# Patient Record
Sex: Male | Born: 1975
Health system: Southern US, Community
[De-identification: ages and names within clinical notes are randomized; demographics above are authoritative.]

## PROBLEM LIST (undated history)

## (undated) DIAGNOSIS — A020 Salmonella enteritis: Secondary | ICD-10-CM

## (undated) DIAGNOSIS — E119 Type 2 diabetes mellitus without complications: Secondary | ICD-10-CM

## (undated) DIAGNOSIS — M722 Plantar fascial fibromatosis: Secondary | ICD-10-CM

## (undated) DIAGNOSIS — G43909 Migraine, unspecified, not intractable, without status migrainosus: Secondary | ICD-10-CM

## (undated) DIAGNOSIS — R252 Cramp and spasm: Secondary | ICD-10-CM

## (undated) DIAGNOSIS — Z87898 Personal history of other specified conditions: Secondary | ICD-10-CM

## (undated) DIAGNOSIS — E785 Hyperlipidemia, unspecified: Secondary | ICD-10-CM

## (undated) DIAGNOSIS — F419 Anxiety disorder, unspecified: Secondary | ICD-10-CM

## (undated) DIAGNOSIS — E042 Nontoxic multinodular goiter: Secondary | ICD-10-CM

## (undated) HISTORY — DX: Salmonella enteritis: A02.0

## (undated) HISTORY — DX: Personal history of other specified conditions: Z87.898

## (undated) HISTORY — DX: Migraine, unspecified, not intractable, without status migrainosus: G43.909

## (undated) HISTORY — DX: Type 2 diabetes mellitus without complications: E11.9

## (undated) HISTORY — DX: Hyperlipidemia, unspecified: E78.5

## (undated) HISTORY — PX: TONSILLECTOMY: SUR1361

## (undated) HISTORY — DX: Cramp and spasm: R25.2

## (undated) HISTORY — PX: CHOLECYSTECTOMY: SHX55

## (undated) HISTORY — DX: Anxiety disorder, unspecified: F41.9

## (undated) HISTORY — DX: Nontoxic multinodular goiter: E04.2

## (undated) HISTORY — DX: Plantar fascial fibromatosis: M72.2

---

## 2004-04-09 ENCOUNTER — Emergency Department: Payer: Self-pay | Admitting: Emergency Medicine

## 2005-07-29 ENCOUNTER — Ambulatory Visit: Payer: Self-pay | Admitting: Gastroenterology

## 2005-10-06 ENCOUNTER — Emergency Department: Payer: Self-pay | Admitting: Unknown Physician Specialty

## 2006-03-07 ENCOUNTER — Emergency Department: Payer: Self-pay | Admitting: Emergency Medicine

## 2006-04-10 ENCOUNTER — Encounter: Payer: Self-pay | Admitting: Family Medicine

## 2006-06-06 ENCOUNTER — Emergency Department: Payer: Self-pay

## 2006-07-06 ENCOUNTER — Ambulatory Visit: Payer: Self-pay | Admitting: Surgery

## 2008-06-26 ENCOUNTER — Encounter: Payer: Self-pay | Admitting: Family Medicine

## 2009-06-09 DIAGNOSIS — A020 Salmonella enteritis: Secondary | ICD-10-CM

## 2009-06-09 HISTORY — DX: Salmonella enteritis: A02.0

## 2009-12-13 ENCOUNTER — Emergency Department: Payer: Self-pay | Admitting: Emergency Medicine

## 2010-03-28 ENCOUNTER — Ambulatory Visit: Payer: Self-pay | Admitting: Family Medicine

## 2010-03-28 DIAGNOSIS — E042 Nontoxic multinodular goiter: Secondary | ICD-10-CM

## 2010-03-28 DIAGNOSIS — E785 Hyperlipidemia, unspecified: Secondary | ICD-10-CM

## 2010-03-29 ENCOUNTER — Ambulatory Visit: Payer: Self-pay | Admitting: Family Medicine

## 2010-03-29 ENCOUNTER — Encounter: Payer: Self-pay | Admitting: Family Medicine

## 2010-03-29 LAB — CONVERTED CEMR LAB
AST: 21 units/L (ref 0–37)
Alkaline Phosphatase: 66 units/L (ref 39–117)
BUN: 14 mg/dL (ref 6–23)
Bilirubin, Direct: 0.1 mg/dL (ref 0.0–0.3)
CO2: 26 meq/L (ref 19–32)
Chloride: 103 meq/L (ref 96–112)
Cholesterol: 190 mg/dL (ref 0–200)
GFR calc non Af Amer: 97.15 mL/min (ref >60)
HDL: 32.5 mg/dL — ABNORMAL LOW (ref >39.00)
Total Bilirubin: 0.6 mg/dL (ref 0.3–1.2)
Triglycerides: 80 mg/dL (ref 0.0–149.0)
VLDL: 16 mg/dL (ref 0.0–40.0)

## 2010-04-04 ENCOUNTER — Encounter (INDEPENDENT_AMBULATORY_CARE_PROVIDER_SITE_OTHER): Payer: Self-pay | Admitting: *Deleted

## 2010-04-04 ENCOUNTER — Telehealth: Payer: Self-pay | Admitting: Family Medicine

## 2010-07-09 NOTE — Assessment & Plan Note (Signed)
Summary: NEW PT TO BE ESTABLISHED/JRR   Vital Signs:  Patient profile:   35 year old male Height:      68.75 inches Weight:      249.12 pounds BMI:     37.19 Temp:     98.9 degrees F oral Pulse rate:   80 / minute Pulse rhythm:   regular BP sitting:   122 / 90  (left arm) Cuff size:   large  Vitals Entered By: Delilah Shan CMA Paul Torpey Dull) (March 28, 2010 9:53 AM) CC: New Patient to Establish   History of Present Illness: New patient.    H/o thyroid nodule.  Due for ultrasound and labs.  Some fatigue noted.  See notes on labs.  Requesting records.   H/o HLD.  Off meds.  Due for labs.  Requesting records.    Allergies (verified): 1)  ! 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, fashion/cl[employer] Divorced x2, 1 daughter, little contact,  no tobacco alcohol: occ minimal exercise  Past Medical History: H/o thyroid nodule Migraines with photo and phonophobia h/o heartburn HLD h/o leg cramps Salmonella enteritis 2011, stool cx postiive  Past Surgical History: Cholecystectomy Tonsillectomy  Family History: Mother alive, HTN, Bipolar, diet controlled DM, COPD Father alive, DM, thyroid disease, PAD 1 brother, thyroid disease, HTN 1 sister, healthy  Social History: From [place], in [place] since 1996 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  Otherwise negative.    Physical Exam  General:  GEN: nad, alert and oriented HEENT: mucous membranes moist NECK: supple w/o LA, no TMG noted CV: rrr.  no murmur PULM: ctab, no inc wob ABD: soft, +bs EXT: no edema SKIN: no acute rash    Impression &  Recommendations:  Problem # 1:  HYPERLIPIDEMIA (ICD-272.4)  Requesting records from Dr. Patrecia Pace.  See notes on labs.   Orders: TLB-Hepatic/Liver Function Pnl (80076-HEPATIC) TLB-BMP (Basic Metabolic Panel-BMET) (80048-METABOL) TLB-Lipid Panel (80061-LIPID)  Problem # 2:  THYROID NODULE, HX OF (ICD-V12.2) Refer for ultrasound and check TSH.   Orders: Radiology Referral (Radiology) TLB-TSH (Thyroid Stimulating Hormone) [phone])  Other Orders: Admin 1st Vaccine (19147) Flu Vaccine 66yrs + (82956) Tdap => 63yrs IM (21308) Admin of Any Addtl Vaccine (65784)  Patient Instructions: 1)  See Shirlee Limerick about your referral before your leave today. 2)  You can get your results through our phone system.  Follow the instructions on the blue card.  3)  Take care.  Glad to see you.  Work on getting more exercise.    Orders Added: 1)  New Patient Level III [99203] 2)  Radiology Referral [Radiology] 3)  TLB-Hepatic/Liver Function Pnl [80076-HEPATIC] 4)  TLB-BMP (Basic Metabolic Panel-BMET) [80048-METABOL] 5)  TLB-Lipid Panel [80061-LIPID] 6)  TLB-TSH (Thyroid Stimulating Hormone) [84443-TSH] 7)  Admin 1st Vaccine [90471] 8)  Flu Vaccine 23yrs + [69629] 9)  Tdap => 92yrs IM [90715] 10)  Admin of Any Addtl Vaccine [52841]   Immunizations Administered:  Tetanus Vaccine:    Vaccine Type: Tdap    Site: left deltoid    Mfr: GlaxoSmithKline    Dose: 0.5 ml    Route: IM    Given  by: Delilah Shan CMA (AAMA)    Exp. Date: 03/28/2012    Lot #: [id number]    VIS given: 04/26/08 version given March 28, 2010.   Immunizations Administered:  Tetanus Vaccine:    Vaccine Type: Tdap    Site: left deltoid    Mfr: GlaxoSmithKline    Dose: 0.5 ml    Route: IM    Given by: Delilah Shan CMA (AAMA)    Exp. Date: 03/28/2012    Lot #: [id number]    VIS given: 04/26/08 version given March 28, 2010.  Prior Medications (reviewed today): None Current Allergies (reviewed today): !  PENICILLINFlu Vaccine Consent Questions     Do you have a history of severe allergic reactions to this vaccine? no    Any prior history of allergic reactions to egg and/or gelatin? no    Do you have a sensitivity to the preservative Thimersol? no    Do you have a past history of Guillan-Barre Syndrome? no    Do you currently have an acute febrile illness? no    Have you ever had a severe reaction to latex? no    Vaccine information given and explained to patient? yes    Are you currently pregnant? no    Lot Number:[id number]   Exp Date:12/07/2010   Site Given  Right  Deltoid IMes (reviewed today): ! PENICILLIN         .lbflu .

## 2010-07-09 NOTE — Letter (Signed)
Summary: Dr. Sharin Grave  Dr. Sharin Grave   Imported By: Maryln Gottron 04/10/2010 11:19:05  _____________________________________________________________________  External Attachment:    Type:   Image     Comment:   External Document

## 2010-07-09 NOTE — Letter (Signed)
Summary: Generic Letter   at [facility]  [address] [place], [place] [address]   Phone: [phone]  Fax: [phone]    04/04/2010    Kyle Murphy [address] [place], [place]  [address]    Dear [name],   I have reviewed your old records.  We need to get a repeat ultrasound of your thyroid in one year and lab work.        Sincerely,   Dwana Curd. Para March, M.D.  [facility]

## 2010-07-09 NOTE — Progress Notes (Signed)
  Phone Note Outgoing Call   Summary of Call: I reviwed the records.  Needs repeat US and TSH in 1 year.  please notify patient.  Initial call taken by: Crawford Givens MD,  April 04, 2010 2:04 PM  Follow-up for Phone Call        Phone number is no longer in service.  Letter mailed. Delilah Shan CMA Duncan Dull)  April 04, 2010 4:42 PM

## 2011-04-01 ENCOUNTER — Encounter: Payer: Self-pay | Admitting: Family Medicine

## 2011-04-01 ENCOUNTER — Ambulatory Visit (INDEPENDENT_AMBULATORY_CARE_PROVIDER_SITE_OTHER): Payer: BC Managed Care – PPO | Admitting: Family Medicine

## 2011-04-01 VITALS — BP 114/80 | HR 79 | Temp 98.2°F | Wt 251.1 lb

## 2011-04-01 DIAGNOSIS — Z0000 Encounter for general adult medical examination without abnormal findings: Secondary | ICD-10-CM | POA: Insufficient documentation

## 2011-04-01 DIAGNOSIS — Z862 Personal history of diseases of the blood and blood-forming organs and certain disorders involving the immune mechanism: Secondary | ICD-10-CM

## 2011-04-01 DIAGNOSIS — E042 Nontoxic multinodular goiter: Secondary | ICD-10-CM

## 2011-04-01 DIAGNOSIS — Z8639 Personal history of other endocrine, nutritional and metabolic disease: Secondary | ICD-10-CM

## 2011-04-01 DIAGNOSIS — Z23 Encounter for immunization: Secondary | ICD-10-CM

## 2011-04-01 DIAGNOSIS — M549 Dorsalgia, unspecified: Secondary | ICD-10-CM | POA: Insufficient documentation

## 2011-04-01 LAB — TSH: TSH: 1.54 u[IU]/mL (ref 0.35–5.50)

## 2011-04-01 NOTE — Assessment & Plan Note (Signed)
No red flag sx of weakness, dec in DTR, etc.  He'll f/u with chiropractor clinic.

## 2011-04-01 NOTE — Assessment & Plan Note (Signed)
Refer for repeat u/s and check TSH today.  He'll ask his brother about his thyroid disease and notify me.  Benign exam.

## 2011-04-01 NOTE — Progress Notes (Signed)
CPE- See plan.  Routine anticipatory guidance given to patient.  See health maintenance.  No indication for early prostate/colon CA screening.    Dec in exercise from leg pain.  "It's just sore" but he can't really elaborate.  Denies burning pain or electrical shock pain. With prolonged sitting, R leg can feel like it's asleep.  He hasn't found anything that helps other than occ ibuprofen. He had prev seen chiropractor with some relief.  The pain seems to be nondermatomal. He does have some R lower back discomfort.   H/o thyroid nodules, due for repeat u/s.  No known FH of thyroid cancer.  Brother had thyroid disease but he doesn't know what kind.  No ant neck pain, no dysphagia.  He's not felt a lump in neck.   PMH and SH reviewed  Meds, vitals, and allergies reviewed.   ROS: See HPI.  Otherwise negative.    GEN: nad, alert and oriented HEENT: mucous membranes moist NECK: supple w/o LA, thyroid not ttp CV: rrr. PULM: ctab, no inc wob ABD: soft, +bs EXT: no edema SKIN: no acute rash Back with R sided L spine discomfort, no midline pain SLR w/o typical radicular pain.  S/S/DTR wnl distally for BLE.

## 2011-04-01 NOTE — Assessment & Plan Note (Signed)
Flu shot today, healthy habits encouraged.  No indication for prostate/colon CA screening.  Tdap up to date.

## 2011-04-01 NOTE — Patient Instructions (Addendum)
I would follow up with the chiropractor about your back and leg pain. I would get a flu shot each fall.   Talk to your brother about why he had thyroid surgery and let me know.  Take care.   You can get your results through our phone system.  Follow the instructions on the blue card.

## 2011-04-04 ENCOUNTER — Encounter: Payer: Self-pay | Admitting: Family Medicine

## 2011-04-04 ENCOUNTER — Ambulatory Visit
Admission: RE | Admit: 2011-04-04 | Discharge: 2011-04-04 | Disposition: A | Discharge status: Home / Self Care | Payer: BC Managed Care – PPO | Source: Ambulatory Visit | Attending: Family Medicine | Admitting: Family Medicine

## 2011-04-04 DIAGNOSIS — E042 Nontoxic multinodular goiter: Secondary | ICD-10-CM

## 2011-08-21 ENCOUNTER — Encounter: Payer: Self-pay | Admitting: *Deleted

## 2012-02-23 ENCOUNTER — Emergency Department: Payer: Self-pay | Admitting: Emergency Medicine

## 2012-04-01 ENCOUNTER — Ambulatory Visit (INDEPENDENT_AMBULATORY_CARE_PROVIDER_SITE_OTHER): Payer: BC Managed Care – PPO | Admitting: Family Medicine

## 2012-04-01 ENCOUNTER — Encounter: Payer: Self-pay | Admitting: Family Medicine

## 2012-04-01 VITALS — BP 114/74 | HR 87 | Temp 98.4°F | Ht 69.5 in | Wt 244.0 lb

## 2012-04-01 DIAGNOSIS — Z8639 Personal history of other endocrine, nutritional and metabolic disease: Secondary | ICD-10-CM

## 2012-04-01 DIAGNOSIS — Z23 Encounter for immunization: Secondary | ICD-10-CM

## 2012-04-01 DIAGNOSIS — E041 Nontoxic single thyroid nodule: Secondary | ICD-10-CM

## 2012-04-01 DIAGNOSIS — Z0000 Encounter for general adult medical examination without abnormal findings: Secondary | ICD-10-CM

## 2012-04-01 NOTE — Patient Instructions (Addendum)
Go to the lab on the way out.  We'll contact you with your lab report. See Marion about your referral before you leave today. Take care.   

## 2012-04-01 NOTE — Assessment & Plan Note (Signed)
Routine anticipatory guidance given to patient. See health maintenance.  Exercise- at the gym.  Flu shot- 2013  Tetanus 2011  Cholesterol checked 2011  Living will encouraged.  Healthy diet encouraged.

## 2012-04-01 NOTE — Assessment & Plan Note (Signed)
Repeat TSH, check u/s.  D/w pt. He agrees.

## 2012-04-01 NOTE — Progress Notes (Signed)
CPE- See plan.  Routine anticipatory guidance given to patient.  See health maintenance. Exercise- at the gym.  Flu shot- 2013 Tetanus 2011 Cholesterol checked 2011 Living will encouraged.   Healthy diet encouraged.   Thyroid nodules.  No masses, dysphagia, pain.  No unexpected weight gain or fatigue.  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    PMH and SH reviewed  Meds, vitals, and allergies reviewed.   ROS: See HPI.  Otherwise negative.    GEN: nad, alert and oriented HEENT: mucous membranes moist NECK: supple w/o LA, thyroid not ttp, no asymmetry noted/felt on exam CV: rrr. PULM: ctab, no inc wob ABD: soft, +bs EXT: no edema SKIN: no acute rash

## 2012-04-06 ENCOUNTER — Other Ambulatory Visit: Payer: BC Managed Care – PPO

## 2012-04-06 ENCOUNTER — Ambulatory Visit
Admission: RE | Admit: 2012-04-06 | Discharge: 2012-04-06 | Disposition: A | Discharge status: Home / Self Care | Payer: BC Managed Care – PPO | Source: Ambulatory Visit | Attending: Family Medicine | Admitting: Family Medicine

## 2012-04-06 ENCOUNTER — Encounter: Payer: Self-pay | Admitting: Family Medicine

## 2012-04-06 DIAGNOSIS — E041 Nontoxic single thyroid nodule: Secondary | ICD-10-CM

## 2012-08-10 ENCOUNTER — Encounter: Payer: Self-pay | Admitting: Family Medicine

## 2012-08-10 LAB — GLUCOSE (CC13)
Cholesterol: 182 mg/dL (ref 0–200)
Creat: 0.91
Glucose: 105
HDL: 28 mg/dL — AB (ref 35–70)
LDL (calc): 120
Triglycerides: 168

## 2013-06-10 ENCOUNTER — Encounter: Payer: Self-pay | Admitting: Podiatrist

## 2013-09-23 ENCOUNTER — Encounter: Payer: Self-pay | Admitting: Family Medicine

## 2013-09-23 ENCOUNTER — Ambulatory Visit (INDEPENDENT_AMBULATORY_CARE_PROVIDER_SITE_OTHER): Payer: 59 | Admitting: Family Medicine

## 2013-09-23 VITALS — BP 112/74 | HR 63 | Temp 98.1°F | Wt 251.5 lb

## 2013-09-23 DIAGNOSIS — R358 Other polyuria: Secondary | ICD-10-CM

## 2013-09-23 DIAGNOSIS — M722 Plantar fascial fibromatosis: Secondary | ICD-10-CM | POA: Insufficient documentation

## 2013-09-23 DIAGNOSIS — R3589 Other polyuria: Secondary | ICD-10-CM | POA: Insufficient documentation

## 2013-09-23 LAB — POCT URINALYSIS DIPSTICK
Bilirubin, UA: NEGATIVE
Glucose, UA: NEGATIVE
Ketones, UA: NEGATIVE
Leukocytes, UA: NEGATIVE
Nitrite, UA: NEGATIVE
PH UA: 7
RBC UA: NEGATIVE
SPEC GRAV UA: 1.02
UROBILINOGEN UA: NEGATIVE

## 2013-09-23 LAB — HEMOGLOBIN A1C: HEMOGLOBIN A1C: 5.6 % (ref 4.6–6.5)

## 2013-09-23 LAB — POCT CBG (FASTING - GLUCOSE)-MANUAL ENTRY: Glucose Fasting, POC: 83 mg/dL (ref 70–99)

## 2013-09-23 NOTE — Assessment & Plan Note (Signed)
Sugar wnl here, check A1c and u/a.  He agrees.  Continue with inc PO fluids in the meantime.  FH DM2 noted.  D/w pt.  He agrees with plan.

## 2013-09-23 NOTE — Patient Instructions (Signed)
Go to the lab on the way out.  We'll contact you with your lab report. Take care.   

## 2013-09-23 NOTE — Progress Notes (Signed)
Pre visit review using our clinic review tool, if applicable. No additional management support is needed unless otherwise documented below in the visit note.  Frequent urination.  Started a few months ago, progressive.  No burning.  Thirsty.  No blood seen in urine.  No dysuria other than frequency.  FH DM2.  Feels okay o/w.    Meds, vitals, and allergies reviewed.   ROS: See HPI.  Otherwise, noncontributory.  nad ncat Mmm rrr ctab abd soft, not ttp Ext w/o edema

## 2013-09-23 NOTE — Assessment & Plan Note (Signed)
D/w pt about stretching and getting soft arch support inserts.  That may help some.

## 2013-11-28 ENCOUNTER — Ambulatory Visit (INDEPENDENT_AMBULATORY_CARE_PROVIDER_SITE_OTHER): Payer: 59 | Admitting: Family Medicine

## 2013-11-28 ENCOUNTER — Encounter: Payer: Self-pay | Admitting: Family Medicine

## 2013-11-28 VITALS — BP 126/78 | HR 70 | Temp 98.5°F | Wt 249.0 lb

## 2013-11-28 DIAGNOSIS — R05 Cough: Secondary | ICD-10-CM | POA: Insufficient documentation

## 2013-11-28 DIAGNOSIS — R059 Cough, unspecified: Secondary | ICD-10-CM | POA: Insufficient documentation

## 2013-11-28 MED ORDER — ESOMEPRAZOLE MAGNESIUM 20 MG PO CPDR: 20.0000 mg | DELAYED_RELEASE_CAPSULE | Freq: Every day | ORAL | Status: DC

## 2013-11-28 MED ORDER — FLUTICASONE PROPIONATE 50 MCG/ACT NA SUSP: 2.0000 | Freq: Every day | NASAL | Status: DC

## 2013-11-28 MED ORDER — GUAIFENESIN-CODEINE 100-10 MG/5ML PO SYRP: 5.0000 mL | ORAL_SOLUTION | Freq: Two times a day (BID) | ORAL | Status: DC | PRN

## 2013-11-28 NOTE — Progress Notes (Signed)
Pre visit review using our clinic review tool, if applicable. No additional management support is needed unless otherwise documented below in the visit note. 

## 2013-11-28 NOTE — Assessment & Plan Note (Signed)
Of 1 mo duration, however lungs CTAB today. ?hidden GERD vs PNDrip. Treat both with trial of nexium 20mg  daily and flonase (sent to pharmacy) Codeine cough syrup for cough at bedtime as needed - discussed sedation precautions. Update if cough not improved with above. Exam/story not consistent with whooping cough today.

## 2013-11-28 NOTE — Patient Instructions (Addendum)
I don't think there's bronchitis infection. I wonder about hidden reflux or allergies. Start nexium samples, use flonase nasal spray for the next 2 weeks. May also use codeine cough syrup at bedtime as needed. Let us know if cough not improving with this. Good to see you today, call us with questions.

## 2013-11-28 NOTE — Progress Notes (Signed)
   BP 126/78  Pulse 70  Temp(Src) 98.5 F (36.9 C) (Oral)  Wt 249 lb (112.946 kg)  SpO2 99%   CC: cough  Subjective:    Patient ID: Kyle Murphy, male    DOB: 12-Jul-1975, 38 y.o.   MRN: [id number]  HPI: Kyle Murphy is a 38 y.o. male presenting on 11/28/2013 for URI   1 mo h/o dry cough. Now with L sided ribcage pain. Started with sore throat and irritation. Tickle that makes him cough.  + PNdrainage. No coughing fits.  No fevers/chills, abd pain, nausea, significant congestion, ear or tooth pain, dysphagia.  Works 3rd shift. Has self treated with 2 bottles robitussin DM. Took tylenol PM which helped. No sick contacts at home. No smokers at home. No h/o asthma, allergies.  H/o GERD in past but no current sxs.  Relevant past medical, surgical, family and social history reviewed and updated as indicated.  Allergies and medications reviewed and updated. No current outpatient prescriptions on file prior to visit.   No current facility-administered medications on file prior to visit.    Review of Systems Per HPI unless specifically indicated above    Objective:    BP 126/78  Pulse 70  Temp(Src) 98.5 F (36.9 C) (Oral)  Wt 249 lb (112.946 kg)  SpO2 99%  Physical Exam  Nursing note and vitals reviewed. Constitutional: He appears well-developed and well-nourished. No distress.  HENT:  Head: Normocephalic and atraumatic.  Right Ear: Hearing, tympanic membrane, external ear and ear canal normal.  Left Ear: Hearing, tympanic membrane, external ear and ear canal normal.  Nose: Mucosal edema (mild) present. No rhinorrhea. Right sinus exhibits no maxillary sinus tenderness and no frontal sinus tenderness. Left sinus exhibits no maxillary sinus tenderness and no frontal sinus tenderness.  Mouth/Throat: Uvula is midline, oropharynx is clear and moist and mucous membranes are normal. No oropharyngeal exudate, posterior oropharyngeal edema, posterior oropharyngeal erythema or  tonsillar abscesses.  Slight congestion behind TMs  Eyes: Conjunctivae and EOM are normal. Pupils are equal, round, and reactive to light. No scleral icterus.  Neck: Normal range of motion. Neck supple. No thyromegaly present.  Cardiovascular: Normal rate, regular rhythm, normal heart sounds and intact distal pulses.   No murmur heard. Pulmonary/Chest: Effort normal and breath sounds normal. No respiratory distress. He has no wheezes. He has no rales.  Lymphadenopathy:    He has no cervical adenopathy.  Skin: Skin is warm and dry. No rash noted.       Assessment & Plan:   Problem List Items Addressed This Visit   Cough - Primary     Of 1 mo duration, however lungs CTAB today. ?hidden GERD vs PNDrip. Treat both with trial of nexium 20mg  daily and flonase (sent to pharmacy) Codeine cough syrup for cough at bedtime as needed - discussed sedation precautions. Update if cough not improved with above. Exam/story not consistent with whooping cough today.        Follow up plan: Return if symptoms worsen or fail to improve.

## 2014-03-24 ENCOUNTER — Ambulatory Visit (INDEPENDENT_AMBULATORY_CARE_PROVIDER_SITE_OTHER): Payer: 59

## 2014-03-24 DIAGNOSIS — Z23 Encounter for immunization: Secondary | ICD-10-CM

## 2014-08-08 ENCOUNTER — Ambulatory Visit (INDEPENDENT_AMBULATORY_CARE_PROVIDER_SITE_OTHER): Payer: 59 | Admitting: Family Medicine

## 2014-08-08 ENCOUNTER — Encounter: Payer: Self-pay | Admitting: Family Medicine

## 2014-08-08 VITALS — BP 122/84 | HR 78 | Temp 98.3°F | Ht 69.25 in | Wt 273.0 lb

## 2014-08-08 DIAGNOSIS — Z0000 Encounter for general adult medical examination without abnormal findings: Secondary | ICD-10-CM

## 2014-08-08 DIAGNOSIS — E785 Hyperlipidemia, unspecified: Secondary | ICD-10-CM

## 2014-08-08 DIAGNOSIS — Z7189 Other specified counseling: Secondary | ICD-10-CM

## 2014-08-08 DIAGNOSIS — K219 Gastro-esophageal reflux disease without esophagitis: Secondary | ICD-10-CM

## 2014-08-08 DIAGNOSIS — E042 Nontoxic multinodular goiter: Secondary | ICD-10-CM

## 2014-08-08 DIAGNOSIS — E041 Nontoxic single thyroid nodule: Secondary | ICD-10-CM

## 2014-08-08 LAB — LIPID PANEL
CHOL/HDL RATIO: 6
Cholesterol: 172 mg/dL (ref 0–200)
HDL: 29.7 mg/dL — AB (ref >39.00)
LDL CALC: 128 mg/dL — AB (ref 0–99)
NONHDL: 142.3
TRIGLYCERIDES: 71 mg/dL (ref 0.0–149.0)
VLDL: 14.2 mg/dL (ref 0.0–40.0)

## 2014-08-08 LAB — TSH: TSH: 3.81 u[IU]/mL (ref 0.35–4.50)

## 2014-08-08 LAB — GLUCOSE, RANDOM: GLUCOSE: 105 mg/dL — AB (ref 70–99)

## 2014-08-08 MED ORDER — OMEPRAZOLE MAGNESIUM 20 MG PO TBEC: 20.0000 mg | DELAYED_RELEASE_TABLET | Freq: Every day | ORAL | Status: DC

## 2014-08-08 NOTE — Patient Instructions (Addendum)
Go to the lab on the way out.  We'll contact you with your lab report. Rosaria Ferries will call about your referral.  I'll work on your form when I have your lab results.  Take OTC prilosec for heartburn in the meantime.  You'll have to make diet changes: less fast food, more exercise.  Let me know if you want to go to nutrition.   Take care.  Glad to see you.

## 2014-08-08 NOTE — Progress Notes (Signed)
Pre visit review using our clinic review tool, if applicable. No additional management support is needed unless otherwise documented below in the visit note.  CPE- See plan.  Routine anticipatory guidance given to patient.  See health maintenance. Exercise- at the gym prev, but not recently.  Encouraged.   Flu shot- 2015 Tetanus 2011 Colon and prostate cancer screening not due.  Cholesterol checked today, along with sugar.   Living will encouraged. He would want his mother designated if patient were incapacitated.   Healthy diet encouraged.   Thyroid nodules. Some dysphagia, coughing with swallowing. Some discomfort in the throat. Weight is up, likely from diet changes. 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   GERD.  Off nexium.  Frequent sx.  Worse with weight inc.  No vomiting, no diarrhea.  Worse with certain foods.  Eating a lot of fast foods.    PMH and SH reviewed  Meds, vitals, and allergies reviewed.   ROS: See HPI.  Otherwise negative.    GEN: nad, alert and oriented, obese HEENT: mucous membranes moist NECK: supple w/o LA, thyroid not ttp on exam.  CV: rrr. PULM: ctab, no inc wob ABD: soft, +bs EXT: no edema SKIN: no acute rash

## 2014-08-09 ENCOUNTER — Ambulatory Visit
Admission: RE | Admit: 2014-08-09 | Discharge: 2014-08-09 | Disposition: A | Discharge status: Home / Self Care | Payer: 59 | Source: Ambulatory Visit | Attending: Family Medicine | Admitting: Family Medicine

## 2014-08-09 DIAGNOSIS — Z7189 Other specified counseling: Secondary | ICD-10-CM | POA: Insufficient documentation

## 2014-08-09 DIAGNOSIS — K219 Gastro-esophageal reflux disease without esophagitis: Secondary | ICD-10-CM | POA: Insufficient documentation

## 2014-08-09 DIAGNOSIS — E042 Nontoxic multinodular goiter: Secondary | ICD-10-CM

## 2014-08-09 NOTE — Assessment & Plan Note (Signed)
Needs weight loss, d/w pt.  Can start OTC prilosec for now.  He agrees.

## 2014-08-09 NOTE — Assessment & Plan Note (Signed)
Routine anticipatory guidance given to patient.  See health maintenance. Exercise- at the gym prev, but not recently.  Encouraged.   Flu shot- 2015 Tetanus 2011 Colon and prostate cancer screening not due.  Cholesterol checked today, along with sugar.   Living will encouraged. He would want his mother designated if patient were incapacitated.   Healthy diet encouraged.

## 2014-08-09 NOTE — Assessment & Plan Note (Signed)
Recheck u/s.  TSH wnl.  D/w pt . He agrees.

## 2014-08-13 ENCOUNTER — Other Ambulatory Visit: Payer: Self-pay | Admitting: Family Medicine

## 2014-08-13 DIAGNOSIS — E041 Nontoxic single thyroid nodule: Secondary | ICD-10-CM

## 2014-08-14 ENCOUNTER — Telehealth: Payer: Self-pay | Admitting: *Deleted

## 2014-08-14 NOTE — Telephone Encounter (Signed)
Patient was advised that our referral coordinators will be calling him to set up an appointment.

## 2014-09-28 ENCOUNTER — Ambulatory Visit (INDEPENDENT_AMBULATORY_CARE_PROVIDER_SITE_OTHER): Payer: 59 | Admitting: Endocrinology

## 2014-09-28 ENCOUNTER — Encounter: Payer: Self-pay | Admitting: Endocrinology

## 2014-09-28 VITALS — BP 122/82 | HR 78 | Resp 14 | Ht 69.25 in | Wt 275.0 lb

## 2014-09-28 DIAGNOSIS — E042 Nontoxic multinodular goiter: Secondary | ICD-10-CM

## 2014-09-28 DIAGNOSIS — E041 Nontoxic single thyroid nodule: Secondary | ICD-10-CM | POA: Diagnosis not present

## 2014-09-28 LAB — TSH: TSH: 1.37 u[IU]/mL (ref 0.35–4.50)

## 2014-09-28 LAB — T4, FREE: FREE T4: 1.01 ng/dL (ref 0.60–1.60)

## 2014-09-28 NOTE — Progress Notes (Signed)
Reason for  Visit-  Kyle Murphy is a 39 y.o.-year-old male, referred by his PCP, Tonia Ghent, MD,  for evaluation for Multinodular goiter.  HPI-  The patient reports being diagnosed with a goiter and thyroid nodules since 2012.  his thyroid ultrasound was done March 2016 at [facility] radiology . It showed enlarging isthmic nodule, now measuring 10 x 5 x 9 mm (Previously this measured up to 8 mm) and enlarging left lobe complex nodule (measures 11 x 9 x 8 mm. Previously, this measured 7 x 8 x 10 mm) .  Prior Thyroid US results are summarized below.  No prior thyroid FNA. PCP has been monitoring thyroid nodules with serial thyroid US and due to enlarging nodules has sent patient to endocrine for further opinion.  he has a family history of thyroid disorders in his father,twin brother and mother. Brother had thyroid surgery, details unclear- but no thyroid cancer. The patient denies any family history of thyroid cancer or personal history of XRT to his neck area. Non smoker.    The patient denies any prior personal history of hypothyroidism. He denies any kelp/herbal supplement use or recent steroids. No recent iv contrast studies.  I reviewed patient's thyroid tests: Lab Results  Component Value Date   TSH 1.37 09/28/2014   TSH 3.81 08/08/2014   TSH 0.90 04/01/2012   TSH 1.54 04/01/2011   TSH 1.57 03/28/2010   FREET4 1.01 09/28/2014       Review of systems: [ x  ] complains of    [  ] denies [  x] weight gain of close to 40 lbs with little change in his diet in past 7 months  [  ] constipation  [  ] fatigue  [  ] dry skin  [  ] cold intolerance [  ] hair changes  [  ] weight loss [  ] tremors [  ] palpitations [  ] diarrhea [  ] increased anxiety [  ] muscle weakness [  ] heat intolerance [  ] fatigue  [  ] proptosis [  ] problems with eye closure or color vision.  [  ] noticing any enlargement in size of thyroid [  ] lumps in neck [ x ] dysphagia -occasionally gets choked with  certain foods, <1 year [  ] change in voice  Reports that he may spontaneously go into gagging spells with or without palpation of neck , usually happens early morning and subsides after 10 am.  Has GERD and now taking Nexium OTC. Partial compliance to this medication.      I have reviewed the patient's past medical history, family and social history, surgical history, medications and allergies.  Past Medical History  Diagnosis Date  . Migraines     with photo and phonophobia  . History of heartburn   . HLD (hyperlipidemia)   . Leg cramps     history of  . Salmonella enteritis 2011    stool culture positive  . Multiple thyroid nodules     hx of, ~1 cm or less, consider recheck u/s 10/13  . Plantar fasciitis     L foot, injected at foot center 2014   Past Surgical History  Procedure Laterality Date  . Cholecystectomy    . Tonsillectomy     Family History  Problem Relation Age of Onset  . Hypertension Mother   . Depression Mother     bipolar  . Diabetes Mother     diet  controlled  . COPD Mother   . Thyroid disease Mother   . Diabetes Father   . Thyroid disease Father   . Thyroid disease Brother   . Hypertension Brother   . Diabetes Brother   . Colon cancer Neg Hx   . Prostate cancer Neg Hx    History   Social History  . Marital Status: Divorced    Spouse Name: N/A  . Number of Children: 1  . Years of Education: N/A   Occupational History  . 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 Smoking status: Never Smoker   . Smokeless tobacco: Never Used  . Alcohol Use: No  . Drug Use: No  . Sexual Activity: Not on file   Other Topics Concern  . Not on file   Social History Narrative   From [place], in [place] since 1996   Divorced x 2, 1 daughter with little contact.   Working 3rd shift at [employer] 2016   Living with brother as of 2016   Current Outpatient Prescriptions on File Prior to Visit  Medication Sig Dispense Refill  .  omeprazole (PRILOSEC OTC) 20 MG tablet Take 1 tablet (20 mg total) by mouth daily. 30 tablet 5   No current facility-administered medications on file prior to visit.   Allergies  Allergen Reactions  . Penicillins     REACTION: Rash, swelling as a child.     Review of Systems- Review of Systems: [x]  complains of  [  ] denies General:   [ x ] Recent weight change [  ] Fatigue  [  ] Loss of appetite Eyes: [  ]  Vision Difficulty [  ]  Eye pain ENT: [  ]  Hearing difficulty [ x ]  Difficulty Swallowing CVS: [  ] Chest pain [  ]  Palpitations/Irregular Heart beat [  ]  Shortness of breath lying flat [  ] Swelling of legs Resp: [ x ] Frequent Cough [  ] Shortness of Breath  [  ]  Wheezing GI: [ x ] Heartburn  [  ] Nausea or Vomiting  [  ] Diarrhea [  ] Constipation  [  ] Abdominal Pain GU: [  ]  Polyuria  [  ]  nocturia Bones/joints:  [  ]  Muscle aches  [  ] Joint Pain  [  ] Bone pain Skin/Hair/Nails: [  ]  Rash  [  ] New stretch marks [  ]  Itching [  ] Hair loss [  ]  Excessive hair growth Reproduction: [  ] Low sexual desire , [  ]  Women: Menstrual cycle problems [  ]  Women: Breast Discharge [  ] Men: Difficulty with erections [  ]  Men: Enlarged Breasts CNS: [  ] Frequent Headaches [  ] Blurry vision [  ] Tremors [  ] Seizures [  ] Loss of consciousness [  ] Localized weakness Endocrine: [  ]  Excess thirst [  ]  Feeling excessively hot [  ]  Feeling excessively cold Heme: [  ]  Easy bruising [  ]  Enlarged glands or lumps in neck Allergy: [  ]  Food allergies [  ] Environmental allergies  Physical Exam- BP 122/82 mmHg  Pulse 78  Resp 14  Ht 5' 9.25" (1.759 m)  Wt 275 lb (124.739 kg)  BMI 40.32 kg/m2  SpO2 97% Wt Readings from Last 3  Encounters:  09/28/14 275 lb (124.739 kg)  08/08/14 273 lb (123.832 kg)  11/28/13 249 lb (112.946 kg)    HEENT: [place]/AT, EOMI, no icterus, no proptosis, no chemosis, no mild lid lag, no retraction, eyes close completely Neck: thyroid gland -  smooth, non-tender, no erythema, no tracheal deviation; negative Pemberton's sign; no lymphadenopathy; no bruits, gags and coughs easily on neck palpation Lungs: good air entry, clear bilaterally Heart: S1&S2 normal, regular rate & rhythm; no murmurs, rubs or gallops Abd: soft, NT, ND, no HSM, +BS Ext: no tremor in hands bilaterally, no edema, 2+ DP/PT pulses, good muscle mass Neuro: normal gait, 2+ reflexes bilaterally, normal 5/5 strength, no proximal myopathy  Derm: no pretibial myxoedema/skin dryness  ASSESSMENT- 1. Multinodular goiter - thyroid U/S: March 2016- EXAM: THYROID ULTRASOUND  TECHNIQUE: Ultrasound examination of the thyroid gland and adjacent soft tissues was performed.  COMPARISON: 04/06/2012  FINDINGS: Right thyroid lobe  Measurements: 5.4 x 1.8 x 2.3 cm. 5 mm solid mid nodule.  Left thyroid lobe  Measurements: 5.3 x 1.6 x 1.9 cm. Complex left mid nodule measures 11 x 9 x 8 mm. Previously, this measured 7 x 8 x 10 mm.  Isthmus  Thickness: 7 mm. Solid right isthmic nodule measures 10 x 5 x 9 mm. Previously this measured up to 8 mm.  Lymphadenopathy  None visualized.  IMPRESSION: Isthmic nodule and left lobe complex nodule have both slightly increased but remain below size criteria for biopsy. Findings do not meet current SRU consensus criteria for biopsy. Follow-up by clinical exam is recommended. If patient has known risk factors for thyroid carcinoma, consider follow-up ultrasound in 12 months. If patient is clinically hyperthyroid, consider nuclear medicine thyroid uptake and scan.Reference: Management of Thyroid Nodules Detected at [place]: Society of Radiologists in [place]. Radiology 2005; [id number].  Personally reviewed the images and findings are summarized in HPI. Images also reviewed with patient.   PLAN: Problem List Items Addressed This Visit      Endocrine   Thyroid nodule - Primary      I reviewed the images of his thyroid ultrasound along with the patient. We discussed that thyroid nodules are common in the population and carry a 5-15% risk of thyroid cancer. he doesn't have the risk factors for thyroid cancer and hence is at standard risk for thyroid cancer. There is no noninvasive test to differentiate between benign and malignant nodules. We discussed about thyroid nodule follow up. His isthmic and left  Thyroid nodule are enlarging in size, however they are still on the smaller side for FNA. Dont feel that they are large enough to explain the gagging spells and the mild dysphagia that he is having. I have asked him to use the Nexium daily and see if this helps with these symptoms. If he is very concerned about the thyroid cancer risk, then could attempt the FNA on 11x9x32mm left thyroid nodule. The patient is all right with monitoring the nodule for now and will plan to repeat thyroid US in 1 year to assess any growth. He will report back if he sees any interim enlargement.   Update thyroid tests today as recent TSH was higher end of normal, and due to recent weight gain and strong FH of thyroid diseases. If thyroid labs are normal, then would recommend yearly TSH testing. Screen for TSH, free T4, TPO at this time.        Relevant Orders   TSH (Completed)   T4, free (Completed)  Thyroid peroxidase antibody      RTC 1 year or sooner if symptoms worsen/persist.   Bynum Bellows [facility] 09/28/2014 2:55 PM

## 2014-09-28 NOTE — Patient Instructions (Signed)
Labs today for thyroid function.   Monitor for any enlargement in region of thyroid, any lumps in neck or increased difficulty with swallowing.  Notify if these were to occur.   Take your antacid daily as directed to see if gagging improves.   Please return in 1 year.

## 2014-09-28 NOTE — Progress Notes (Signed)
Pre visit review using our clinic review tool, if applicable. No additional management support is needed unless otherwise documented below in the visit note. 

## 2014-09-28 NOTE — Assessment & Plan Note (Signed)
I reviewed the images of his thyroid ultrasound along with the patient. We discussed that thyroid nodules are common in the population and carry a 5-15% risk of thyroid cancer. he doesn't have the risk factors for thyroid cancer and hence is at standard risk for thyroid cancer. There is no noninvasive test to differentiate between benign and malignant nodules. We discussed about thyroid nodule follow up. His isthmic and left  Thyroid nodule are enlarging in size, however they are still on the smaller side for FNA. Dont feel that they are large enough to explain the gagging spells and the mild dysphagia that he is having. I have asked him to use the Nexium daily and see if this helps with these symptoms. If he is very concerned about the thyroid cancer risk, then could attempt the FNA on 11x9x96mm left thyroid nodule. The patient is all right with monitoring the nodule for now and will plan to repeat thyroid US in 1 year to assess any growth. He will report back if he sees any interim enlargement.   Update thyroid tests today as recent TSH was higher end of normal, and due to recent weight gain and strong FH of thyroid diseases. If thyroid labs are normal, then would recommend yearly TSH testing. Screen for TSH, free T4, TPO at this time.

## 2014-09-29 LAB — THYROID PEROXIDASE ANTIBODY: THYROID PEROXIDASE ANTIBODY: 6 [IU]/mL (ref <9)

## 2015-03-23 ENCOUNTER — Ambulatory Visit (INDEPENDENT_AMBULATORY_CARE_PROVIDER_SITE_OTHER): Payer: 59 | Admitting: Family Medicine

## 2015-03-23 ENCOUNTER — Encounter: Payer: Self-pay | Admitting: Family Medicine

## 2015-03-23 ENCOUNTER — Encounter (INDEPENDENT_AMBULATORY_CARE_PROVIDER_SITE_OTHER): Payer: Self-pay

## 2015-03-23 VITALS — BP 114/72 | HR 88 | Temp 97.9°F | Wt 271.5 lb

## 2015-03-23 DIAGNOSIS — Z23 Encounter for immunization: Secondary | ICD-10-CM | POA: Diagnosis not present

## 2015-03-23 DIAGNOSIS — H9209 Otalgia, unspecified ear: Secondary | ICD-10-CM | POA: Insufficient documentation

## 2015-03-23 DIAGNOSIS — H9201 Otalgia, right ear: Secondary | ICD-10-CM

## 2015-03-23 MED ORDER — OMEPRAZOLE MAGNESIUM 20 MG PO TBEC: 20.0000 mg | DELAYED_RELEASE_TABLET | Freq: Every day | ORAL | Status: DC

## 2015-03-23 MED ORDER — CLINDAMYCIN HCL 300 MG PO CAPS: 300.0000 mg | ORAL_CAPSULE | Freq: Three times a day (TID) | ORAL | Status: DC

## 2015-03-23 NOTE — Patient Instructions (Signed)
Start prilosec and see if that helps.  Start clinda.  Take ibuprofen for pain and call about dental follow up.   Ibuprofen can likely make the heartburn worse in the longrun but is a good short term option for the pain.  Take care.  Glad to see you.

## 2015-03-23 NOTE — Assessment & Plan Note (Signed)
TMJ not ttp.  This could have been a separate issue (now resolved) or due to dental problems.  D/w pt.  Would start clinda in the meantime.  He already has a call into the dental clinic.  Ibuprofen in meantime, with discussion about gerd sx.  Okay to try prilosec in meantime.  He agrees with plan.  Okay for outpatient f/u.

## 2015-03-23 NOTE — Progress Notes (Signed)
Pre visit review using our clinic review tool, if applicable. No additional management support is needed unless otherwise documented below in the visit note.  He's had more GERD sx recently.  Off PPI.  D/w pt about options.  More burning in the throat.  D/w pt about trigger foods, esp coffee.  D/w pt about weight loss.  He is on 2nd shift and sleeping better now.    R ear pain.  For about 1 week.  Now with R sided facial pain.  He does have a broken tooth, that may need pulling.  The tooth started hurting this AM.  Using ambesol with some relief on the tooth.  He has taken ibuprofen this AM, with some relief.  No fevers.  No rhinorrhea.    Meds, vitals, and allergies reviewed.   ROS: See HPI.  Otherwise, noncontributory.  nad ncat TMs wnl B, normal R TM movement.   Nasal exam wnl OP wnl except for cracked R lower premolar.  No draining pus. MMM Neck supple, no LA rrr

## 2015-05-18 ENCOUNTER — Other Ambulatory Visit: Payer: Self-pay | Admitting: *Deleted

## 2015-05-18 MED ORDER — OMEPRAZOLE MAGNESIUM 20 MG PO TBEC: 20.0000 mg | DELAYED_RELEASE_TABLET | Freq: Every day | ORAL | Status: DC

## 2015-07-24 ENCOUNTER — Telehealth: Payer: Self-pay | Admitting: Family Medicine

## 2015-07-24 NOTE — Telephone Encounter (Signed)
[facility]    --------------------------------------------------------------------------------   Patient Name: Kyle Murphy  Gender: Male  DOB: September 29, 1975   Age: 40 Y 2 M 5 D  Return Phone Number: [phone] (Primary)  Address:     City/State/Zip:  [place]     Client [facility] - Client  Client Site [facility] - Day  Physician Renford Dills   Contact Type Call  Who Is Calling Patient / Member / Family / Caregiver  Call Type Triage / Clinical  Caller Name Calloway   Relationship To Patient Self  Return Phone Number [phone] (Primary)  Chief Complaint CHEST PAIN (>=21 years) - pain, pressure, heaviness or tightness  Reason for Call Symptomatic / Request for Health Information  Initial Comment Caller states I am having a lot chest pains and troubling berating   PreDisposition Call Doctor  Translation No       Nurse Assessment  Nurse: Amalia Hailey, RN, Melissa Date/Time (Eastern Time): 07/24/2015 4:34:46 PM  Confirm and document reason for call. If symptomatic, describe symptoms. You must click the next button to save text entered. ---Caller states I am having a lot chest pains and troubling breathing    Has the patient traveled out of the country within the last 30 days? ---Not Applicable    Does the patient have any new or worsening symptoms? ---Yes    Will a triage be completed? ---Yes    Related visit to physician within the last 2 weeks? ---No    Does the PT have any chronic conditions? (i.e. diabetes, asthma, etc.) ---Yes    List chronic conditions. ---Reflux    Is this a behavioral health or substance abuse call? ---No           Guidelines          Guideline Title Affirmed Question Affirmed Notes Nurse Date/Time Eilene Ghazi Time)  Chest Pain [1] Chest pain lasts > 5 minutes AND [2] occurred > 3 days ago (72 hours) AND [3] NO  chest pain or cardiac symptoms now    Amalia Hailey, RN, Melissa 07/24/2015 4:35:43 PM    Disp. Time Eilene Ghazi Time) Disposition Final User    07/24/2015 4:32:26 PM Send to Urgent [facility]    07/24/2015 4:41:40 PM See Physician within 24 Hours   Amalia Hailey, RN, [facility]      07/24/2015 4:41:42 PM See Physician within 24 Hours Yes Amalia Hailey, RN, [facility]            Caller Understands: Yes  Disagree/Comply: Comply       Care Advice Given Per Guideline        SEE PHYSICIAN WITHIN 24 HOURS: CALL BACK IF: * You become worse. * Difficulty breathing occurs * Chest pain increases in frequency, duration or severity * Chest pain lasts over 5 minutes        --------------------------------------------------------------------------------            Referrals  REFERRED TO PCP OFFICE

## 2015-07-25 ENCOUNTER — Ambulatory Visit (INDEPENDENT_AMBULATORY_CARE_PROVIDER_SITE_OTHER): Payer: 59 | Admitting: Family Medicine

## 2015-07-25 ENCOUNTER — Encounter: Payer: Self-pay | Admitting: Family Medicine

## 2015-07-25 VITALS — BP 112/64 | HR 86 | Temp 99.6°F | Wt 277.5 lb

## 2015-07-25 DIAGNOSIS — E042 Nontoxic multinodular goiter: Secondary | ICD-10-CM

## 2015-07-25 DIAGNOSIS — R079 Chest pain, unspecified: Secondary | ICD-10-CM | POA: Insufficient documentation

## 2015-07-25 DIAGNOSIS — E041 Nontoxic single thyroid nodule: Secondary | ICD-10-CM

## 2015-07-25 MED ORDER — ASPIRIN EC 81 MG PO TBEC: 81.0000 mg | DELAYED_RELEASE_TABLET | Freq: Every day | ORAL | Status: DC

## 2015-07-25 MED ORDER — NITROGLYCERIN 0.4 MG SL SUBL: 0.4000 mg | SUBLINGUAL_TABLET | SUBLINGUAL | Status: DC | PRN

## 2015-07-25 NOTE — Progress Notes (Signed)
Pre visit review using our clinic review tool, if applicable. No additional management support is needed unless otherwise documented below in the visit note.  Chest sx.  Started about a few months ago.  Intermittent pain, worse in the meantime.  Sometimes w/o sx.  More sx with higher level of exertion.  No sx at rest.  He can feel his heart racing during the events.  L sided chest pain.  Last episode was about 2 days ago, moving a couch.  He'll get better with rest, usually a few minutes of rest and he'll be back to baseline.  Can get SOB with the episodes.  No BLE edema.  Nonsmoker.    H/o thyroid nodules, will be due for f/u imaging next month.  D/w pt.  He has a lot of throat clearing, esp in the AM.    GERD controlled with PPI, sx return when off PPI.    H/o CAD, father with h/o stent.    Meds, vitals, and allergies reviewed.   ROS: See HPI.  Otherwise, noncontributory.  GEN: nad, alert and oriented, overweight.  HEENT: mucous membranes moist NECK: supple w/o LA CV: rrr.  no murmur PULM: ctab, no inc wob ABD: soft, +bs EXT: no edema SKIN: no acute rash

## 2015-07-25 NOTE — Telephone Encounter (Signed)
Appointment with Dr. Damita Dunnings today at 1700.

## 2015-07-25 NOTE — Patient Instructions (Signed)
Kyle Murphy will call about your referral (cardiology and for the thyroid ultrasound).  Go to the lab on the way out.  We'll contact you with your lab report. In the meantime, limit exertion.  If you have chest pain, then take nitroglycerin and dial 911.  Start taking aspirin 81mg  a day for now.  Take care.  Glad to see you.

## 2015-07-25 NOTE — Assessment & Plan Note (Signed)
Recheck thyroid u/s and tsh, orders in emr.  D/w pt.  He agrees.

## 2015-07-25 NOTE — Assessment & Plan Note (Signed)
FH noted.  No sx now.  Limit exertion. Refer to cards.  Check cmet lipid.  Start ASA.  Use NTG prn.  ER cautions d/w pt.  He agrees.   At this point okay for outpatient f/u as he has zero chest pain currently, and still none at rest.

## 2015-07-26 ENCOUNTER — Telehealth: Payer: Self-pay

## 2015-07-26 ENCOUNTER — Other Ambulatory Visit: Payer: Self-pay

## 2015-07-26 ENCOUNTER — Ambulatory Visit (INDEPENDENT_AMBULATORY_CARE_PROVIDER_SITE_OTHER): Payer: 59

## 2015-07-26 ENCOUNTER — Ambulatory Visit (INDEPENDENT_AMBULATORY_CARE_PROVIDER_SITE_OTHER): Payer: 59 | Admitting: Cardiovascular Disease

## 2015-07-26 ENCOUNTER — Encounter: Payer: Self-pay | Admitting: Cardiovascular Disease

## 2015-07-26 VITALS — BP 128/82 | HR 78 | Ht 69.25 in | Wt 279.8 lb

## 2015-07-26 DIAGNOSIS — E785 Hyperlipidemia, unspecified: Secondary | ICD-10-CM

## 2015-07-26 DIAGNOSIS — R0602 Shortness of breath: Secondary | ICD-10-CM

## 2015-07-26 DIAGNOSIS — R079 Chest pain, unspecified: Secondary | ICD-10-CM | POA: Diagnosis not present

## 2015-07-26 LAB — COMPREHENSIVE METABOLIC PANEL
ALT: 26 U/L (ref 0–53)
AST: 17 U/L (ref 0–37)
Albumin: 4.2 g/dL (ref 3.5–5.2)
Alkaline Phosphatase: 60 U/L (ref 39–117)
BILIRUBIN TOTAL: 0.3 mg/dL (ref 0.2–1.2)
BUN: 13 mg/dL (ref 6–23)
CHLORIDE: 104 meq/L (ref 96–112)
CO2: 29 meq/L (ref 19–32)
CREATININE: 0.92 mg/dL (ref 0.40–1.50)
Calcium: 9.2 mg/dL (ref 8.4–10.5)
GFR: 96.75 mL/min (ref >60.00)
GLUCOSE: 90 mg/dL (ref 70–99)
Potassium: 4.2 mEq/L (ref 3.5–5.1)
Sodium: 139 mEq/L (ref 135–145)
Total Protein: 7.6 g/dL (ref 6.0–8.3)

## 2015-07-26 LAB — EXERCISE TOLERANCE TEST
CHL CUP MPHR: 180 {beats}/min
CSEPEDS: 59 s
CSEPEW: 10 METS
CSEPHR: 85 %
Exercise duration (min): 7 min
Peak HR: 153 {beats}/min
Rest HR: 86 {beats}/min

## 2015-07-26 LAB — TSH: TSH: 3.38 u[IU]/mL (ref 0.35–4.50)

## 2015-07-26 LAB — LIPID PANEL
CHOL/HDL RATIO: 5
Cholesterol: 189 mg/dL (ref 0–200)
HDL: 40 mg/dL (ref >39.00)
LDL CALC: 135 mg/dL — AB (ref 0–99)
NONHDL: 149.49
TRIGLYCERIDES: 72 mg/dL (ref 0.0–149.0)
VLDL: 14.4 mg/dL (ref 0.0–40.0)

## 2015-07-26 NOTE — Telephone Encounter (Signed)
Dr. Fletcher Anon present during GXT and has reviewed the study. Advised pt that results were acceptable and gave verbal order for echo and 1 month f/u. Echo and f/u appt scheduled.

## 2015-07-26 NOTE — Patient Instructions (Signed)
Medication Instructions:  Your physician recommends that you continue on your current medications as directed. Please refer to the Current Medication list given to you today.   Labwork: none  Testing/Procedures: Your physician has requested that you have an exercise tolerance test. For further information please visit [email or web address]. Please also follow instruction sheet, as given.    Follow-Up: Your physician recommends that you schedule a follow-up appointment as needed after ETT.   Any Other Special Instructions Will Be Listed Below (If Applicable).     If you need a refill on your cardiac medications before your next appointment, please call your pharmacy.  Exercise Stress Electrocardiogram An exercise stress electrocardiogram is a test that is done to evaluate the blood supply to your heart. This test may also be called exercise stress electrocardiography. The test is done while you are walking on a treadmill. The goal of this test is to raise your heart rate. This test is done to find areas of poor blood flow to the heart by determining the extent of coronary artery disease (CAD).   CAD is defined as narrowing in one or more heart (coronary) arteries of more than 70%. If you have an abnormal test result, this may mean that you are not getting adequate blood flow to your heart during exercise. Additional testing may be needed to understand why your test was abnormal. LET [facility] CARE PROVIDER KNOW ABOUT:   Any allergies you have.  All medicines you are taking, including vitamins, herbs, eye drops, creams, and over-the-counter medicines.  Previous problems you or members of your family have had with the use of anesthetics.  Any blood disorders you have.  Previous surgeries you have had.  Medical conditions you have.  Possibility of pregnancy, if this applies. RISKS AND COMPLICATIONS Generally, this is a safe procedure. However, as with any procedure,  complications can occur. Possible complications can include:  Pain or pressure in the following areas:  Chest.  Jaw or neck.  Between your shoulder blades.  Radiating down your left arm.  Dizziness or light-headedness.  Shortness of breath.  Increased or irregular heartbeats.  Nausea or vomiting.  Heart attack (rare). BEFORE THE PROCEDURE  Avoid all forms of caffeine 24 hours before your test or as directed by your health care provider. This includes coffee, tea (even decaffeinated tea), caffeinated sodas, chocolate, cocoa, and certain pain medicines.  Follow your health care provider's instructions regarding eating and drinking before the test.  Take your medicines as directed at regular times with water unless instructed otherwise. Exceptions may include:  If you have diabetes, ask how you are to take your insulin or pills. It is common to adjust insulin dosing the morning of the test.  If you are taking beta-blocker medicines, it is important to talk to your health care provider about these medicines well before the date of your test. Taking beta-blocker medicines may interfere with the test. In some cases, these medicines need to be changed or stopped 24 hours or more before the test.  If you wear a nitroglycerin patch, it may need to be removed prior to the test. Ask your health care provider if the patch should be removed before the test.  If you use an inhaler for any breathing condition, bring it with you to the test.  If you are an outpatient, bring a snack so you can eat right after the stress phase of the test.  Do not smoke for 4 hours prior to the test or  as directed by your health care provider.  Do not apply lotions, powders, creams, or oils on your chest prior to the test.  Wear loose-fitting clothes and comfortable shoes for the test. This test involves walking on a treadmill. PROCEDURE  Multiple patches (electrodes) will be put on your chest. If needed,  small areas of your chest may have to be shaved to get better contact with the electrodes. Once the electrodes are attached to your body, multiple wires will be attached to the electrodes and your heart rate will be monitored.  Your heart will be monitored both at rest and while exercising.  You will walk on a treadmill. The treadmill will be started at a slow pace. The treadmill speed and incline will gradually be increased to raise your heart rate. AFTER THE PROCEDURE  Your heart rate and blood pressure will be monitored after the test.  You may return to your normal schedule including diet, activities, and medicines, unless your health care provider tells you otherwise.   This information is not intended to replace advice given to you by your health care provider. Make sure you discuss any questions you have with your health care provider.   Document Released: 05/23/2000 Document Revised: 05/31/2013 Document Reviewed: 01/31/2013 Elsevier Interactive Patient Education [employer].

## 2015-07-27 NOTE — Assessment & Plan Note (Addendum)
The patient's exertional symptoms are worrisome and suggestive of class II angina. His baseline EKG is normal. Cardiac physical exam is unremarkable. Risk factors include obesity and sedentary lifestyle. I decided to proceed with a treadmill stress test which I personally supervised. He was able to exercise for 8 minutes and achieved 85% of maximal predicted heart rate. He did not have any chest pain during stress test and had no EKG changes suggestive of ischemia. Thus, the stress test is low risk and overall reassuring. It appears that his chest pain is not always happening with over exertion. I ordered an echocardiogram to ensure no structural heart abnormalities and I want him to follow-up with me after echocardiogram to ensure resolution of symptoms. There is a possibility of physical deconditioning leading to some of his symptoms. I discussed with the patient the importance of lifestyle changes in order to decrease the chance of future coronary artery disease and cardiovascular events. We discussed the importance of controlling risk factors, healthy diet as well as regular exercise.

## 2015-07-27 NOTE — Progress Notes (Signed)
HPI  This is a pleasant 40 year old man who was referred by Dr. Damita Dunnings for evaluation of exertional chest pain. He has no previous cardiac history. He has no significant chronic medical conditions other than obesity. He is not a smoker. Family history is negative for coronary artery disease but he does have family history of diabetes. He had no previous cardiac workup. Over the last 2 weeks, he has experienced intermittent left sided chest pain described as tightness and squeezing feeling with no radiation. This is usually associated with shortness of breath and mainly happens with over exertion but not with light activities. It does not happen at rest. It is associated with palpitations but no syncope or presyncope. 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 The chest pain is not happening on a consistent basis. He does not exercise on a regular basis.  Allergies  Allergen Reactions  . Penicillins     REACTION: Rash, swelling as a child.     Current Outpatient Prescriptions on File Prior to Visit  Medication Sig Dispense Refill  . aspirin EC 81 MG tablet Take 1 tablet (81 mg total) by mouth daily.    . Multiple Vitamin (MULTIVITAMIN) tablet Take 1 tablet by mouth daily.    . nitroGLYCERIN (NITROSTAT) 0.4 MG SL tablet Place 1 tablet (0.4 mg total) under the tongue every 5 (five) minutes as needed for chest pain (max 3 doses in 15 minutes). 25 tablet 3  . omeprazole (PRILOSEC OTC) 20 MG tablet Take 1 tablet (20 mg total) by mouth daily. 90 tablet 1   No current facility-administered medications on file prior to visit.     Past Medical History  Diagnosis Date  . Migraines     with photo and phonophobia  . History of heartburn   . HLD (hyperlipidemia)   . Leg cramps     history of  . Salmonella enteritis 2011    stool culture positive  . Plantar fasciitis     L foot, [facility] 2014  . Multiple thyroid nodules      Past Surgical History  Procedure Laterality Date   . Cholecystectomy    . Tonsillectomy       Family History  Problem Relation Age of Onset  . Hypertension Mother   . Depression Mother     bipolar  . Diabetes Mother     diet controlled  . COPD Mother   . Thyroid disease Mother   . Diabetes Father   . Thyroid disease Father   . Heart disease Father   . Thyroid disease Brother   . Hypertension Brother   . Diabetes Brother   . Colon cancer Neg Hx   . Prostate cancer Neg Hx      Social History   Social History  . Marital Status: Divorced    Spouse Name: N/A  . Number of Children: 1  . Years of Education: N/A   Occupational History  . 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 Smoking status: Never Smoker   . Smokeless tobacco: Never Used  . Alcohol Use: 0.0 oz/week    0 Standard drinks or equivalent per week     Comment: occassionally  . Drug Use: No  . Sexual Activity: Not on file   Other Topics Concern  . Not on file   Social History Narrative   From [place], in [place] since 1996   Divorced x 2, 1 daughter  with little contact.   Working 1st shift at [employer] 2016   Living with brother as of 2016     ROS A 10 point review of system was performed. It is negative other than that mentioned in the history of present illness.   PHYSICAL EXAM   BP 128/82 mmHg  Pulse 78  Ht 5' 9.25" (1.759 m)  Wt 279 lb 12 oz (126.894 kg)  BMI 41.01 kg/m2 Constitutional: He is oriented to person, place, and time. He appears well-developed and well-nourished. No distress.  HENT: No nasal discharge.  Head: Normocephalic and atraumatic.  Eyes: Pupils are equal and round.  No discharge. Neck: Normal range of motion. Neck supple. No JVD present. No thyromegaly present.  Cardiovascular: Normal rate, regular rhythm, normal heart sounds. Exam reveals no gallop and no friction rub. No murmur heard.  Pulmonary/Chest: Effort normal and breath sounds normal. No stridor. No respiratory distress. He has no  wheezes. He has no rales. He exhibits no tenderness.  Abdominal: Soft. Bowel sounds are normal. He exhibits no distension. There is no tenderness. There is no rebound and no guarding.  Musculoskeletal: Normal range of motion. He exhibits no edema and no tenderness.  Neurological: He is alert and oriented to person, place, and time. Coordination normal.  Skin: Skin is warm and dry. No rash noted. He is not diaphoretic. No erythema. No pallor.  Psychiatric: He has a normal mood and affect. His behavior is normal. Judgment and thought content normal.       EKG: Normal sinus rhythm with no significant ST or T wave changes.   ASSESSMENT AND PLAN

## 2015-07-27 NOTE — Assessment & Plan Note (Signed)
Lab Results  Component Value Date   CHOL 189 07/25/2015   HDL 40.00 07/25/2015   LDLCALC 135* 07/25/2015   TRIG 72.0 07/25/2015   CHOLHDL 5 07/25/2015   Continue with lifestyle changes.

## 2015-07-30 ENCOUNTER — Ambulatory Visit
Admission: RE | Admit: 2015-07-30 | Discharge: 2015-07-30 | Disposition: A | Discharge status: Home / Self Care | Payer: 59 | Source: Ambulatory Visit | Attending: Family Medicine | Admitting: Family Medicine

## 2015-07-30 DIAGNOSIS — E041 Nontoxic single thyroid nodule: Secondary | ICD-10-CM

## 2015-08-01 ENCOUNTER — Ambulatory Visit (INDEPENDENT_AMBULATORY_CARE_PROVIDER_SITE_OTHER): Payer: 59

## 2015-08-01 ENCOUNTER — Other Ambulatory Visit: Payer: Self-pay

## 2015-08-01 DIAGNOSIS — R079 Chest pain, unspecified: Secondary | ICD-10-CM

## 2015-08-09 ENCOUNTER — Other Ambulatory Visit: Payer: 59

## 2015-08-14 ENCOUNTER — Encounter: Payer: Self-pay | Admitting: Family Medicine

## 2015-08-14 ENCOUNTER — Ambulatory Visit (INDEPENDENT_AMBULATORY_CARE_PROVIDER_SITE_OTHER): Payer: 59 | Admitting: Family Medicine

## 2015-08-14 VITALS — BP 130/76 | HR 72 | Temp 99.2°F | Ht 69.0 in | Wt 287.2 lb

## 2015-08-14 DIAGNOSIS — Z0000 Encounter for general adult medical examination without abnormal findings: Secondary | ICD-10-CM

## 2015-08-14 DIAGNOSIS — E042 Nontoxic multinodular goiter: Secondary | ICD-10-CM

## 2015-08-14 DIAGNOSIS — K219 Gastro-esophageal reflux disease without esophagitis: Secondary | ICD-10-CM

## 2015-08-14 DIAGNOSIS — Z119 Encounter for screening for infectious and parasitic diseases, unspecified: Secondary | ICD-10-CM

## 2015-08-14 NOTE — Patient Instructions (Addendum)
Look up diabetes.org and heart.org for diet and exercise tips.   Pick a section and read up on it.   Look at the eat right diet.  Goal weight loss 1-2 lbs per month.  Take care.  Glad to see you.

## 2015-08-14 NOTE — Progress Notes (Signed)
Pre visit review using our clinic review tool, if applicable. No additional management support is needed unless otherwise documented below in the visit note.  CPE- See plan.  Routine anticipatory guidance given to patient.  See health maintenance. Tetanus 2011 Flu 2016 PNA and shingles not due Colon cancer screening and PSA not due.   Living will d/w pt.  Mother designated if patient were incapacitated.   Diet and exercise d/w pt.  Needs weight loss.  He is going to get back in the gym.  Diet encouraged.   Pt opts in for HIV screening with next set of labs.  D/w pt re: routine screening.    GERD controlled with PPI, d/w pt about weight loss.    Thyroid nodules. D/w pt about u/s.  Similar findings of multi nodular goiter with the majority of discretely measured thyroid nodules appearing grossly unchanged since the 03/2012 examination. We can consider recheck TSH next year. He agrees.    Prev URI sx resolved, with mucinex.    PMH and SH reviewed  Meds, vitals, and allergies reviewed.   ROS: See HPI.  Otherwise negative.    GEN: nad, alert and oriented HEENT: mucous membranes moist NECK: supple w/o LA, thyroid not ttp CV: rrr. PULM: ctab, no inc wob ABD: soft, +bs EXT: no edema SKIN: no acute rash

## 2015-08-16 NOTE — Assessment & Plan Note (Signed)
D/w pt about u/s.  Similar findings of multi nodular goiter with the majority of discretely measured thyroid nodules appearing grossly unchanged since the 03/2012 examination. We can consider recheck TSH next year. He agrees.

## 2015-08-16 NOTE — Assessment & Plan Note (Signed)
Tetanus 2011 Flu 2016 PNA and shingles not due Colon cancer screening and PSA not due.   Living will d/w pt.  Mother designated if patient were incapacitated.   Diet and exercise d/w pt.  Needs weight loss.  He is going to get back in the gym.  Diet encouraged.   Pt opts in for HIV screening with next set of labs.  D/w pt re: routine screening.

## 2015-08-16 NOTE — Assessment & Plan Note (Signed)
Continue PPI for now, encouraged weight loss.  D/w pt.

## 2015-09-17 ENCOUNTER — Ambulatory Visit: Payer: 59 | Admitting: Cardiovascular Disease

## 2015-10-02 ENCOUNTER — Encounter: Payer: 59 | Admitting: Endocrinology

## 2016-01-04 ENCOUNTER — Telehealth: Payer: Self-pay | Admitting: Family Medicine

## 2016-01-04 NOTE — Telephone Encounter (Signed)
[facility] Patient Name: Kyle Murphy DOB: 12/22/1975 Initial Comment Caller states, has a son who's leg is swollen and red ring around bite. Last week noticed the bite. Verified. Nurse Assessment Nurse: Germain Osgood RN, Opal Sidles Date/Time Eilene Ghazi Time): 01/04/2016 4:25:07 PM Confirm and document reason for call. If symptomatic, describe symptoms. You must click the next button to save text entered. ---Caller states he has a big red mark across back of left leg where bitten by something a week ago. Has the patient traveled out of the country within the last 30 days? ---No Does the patient have any new or worsening symptoms? ---Yes Will a triage be completed? ---Yes Related visit to physician within the last 2 weeks? ---No Does the PT have any chronic conditions? (i.e. diabetes, asthma, etc.) ---No Is this a behavioral health or substance abuse call? ---No Guidelines Guideline Title Affirmed Question Affirmed Notes Insect Bite [1] Red or very tender (to touch) area AND [2] started over 24 hours after the bite Final Disposition User See Physician within [place], RN, Jane Referrals [facility] Disagree/Comply: Comply

## 2016-01-04 NOTE — Telephone Encounter (Signed)
Noted  

## 2016-01-04 NOTE — Telephone Encounter (Signed)
Pt has appt at [facility] clinic 01/05/16 at 10:30 with Dr Ernestine Conrad.

## 2016-01-05 ENCOUNTER — Encounter: Payer: Self-pay | Admitting: Family Medicine

## 2016-01-05 ENCOUNTER — Ambulatory Visit (INDEPENDENT_AMBULATORY_CARE_PROVIDER_SITE_OTHER): Payer: 59 | Admitting: Family Medicine

## 2016-01-05 VITALS — BP 124/82 | HR 77 | Temp 98.2°F | Resp 16 | Ht 69.0 in | Wt 291.0 lb

## 2016-01-05 DIAGNOSIS — R21 Rash and other nonspecific skin eruption: Secondary | ICD-10-CM | POA: Diagnosis not present

## 2016-01-05 DIAGNOSIS — R059 Cough, unspecified: Secondary | ICD-10-CM

## 2016-01-05 DIAGNOSIS — R05 Cough: Secondary | ICD-10-CM | POA: Diagnosis not present

## 2016-01-05 MED ORDER — BENZONATATE 200 MG PO CAPS: 200.0000 mg | ORAL_CAPSULE | Freq: Two times a day (BID) | ORAL | 0 refills | Status: DC | PRN

## 2016-01-05 NOTE — Progress Notes (Signed)
OFFICE VISIT  01/05/2016   CC: left leg rash    HPI:    Patient is a 40 y.o.  male who presents for left lower leg rash, onset about a week ago, got really red yesterday.  Pt showed me a photo from yesterday of a well demarcated erythematous rash in band like distribution around upper ankle--covering about 1/2 the circumference of the calf.  No pain or itching.  Nothing applied to it.  It appears MUCH better this morning.  There have been no insect bites in the area, no blisters or pustules.  Also has PND/GERD feeling and has dry cough x 1 week.  No fevers. No URI sx's.  Past Medical History:  Diagnosis Date  . History of heartburn   . HLD (hyperlipidemia)   . Leg cramps    history of  . Migraines    with photo and phonophobia  . Multiple thyroid nodules   . Plantar fasciitis    L foot, injected at foot center 2014  . Salmonella enteritis 2011   stool culture positive   Past Surgical History:  Procedure Laterality Date  . CHOLECYSTECTOMY    . TONSILLECTOMY     EXAM:  BP 124/82 (BP Location: Left Arm, Patient Position: Sitting, Cuff Size: Large)   Pulse 77   Temp 98.2 F (36.8 C) (Oral)   Resp 16   Ht 5\' 9"  (1.753 m)   Wt 291 lb (132 kg)   SpO2 93%   BMI 42.97 kg/m  Gen: Alert, well appearing.  Patient is oriented to person, place, time, and situation. AFFECT: pleasant, lucid thought and speech. Left leg mid calf with a faint, mildly pinkish-hued macular rash on medial aspect of calf.  Edges are pretty well demarcated.  No erythema, no tenderness, no induration, no pustules, no vesicles, no streaking. [id number]: no injection, icteris, swelling, or exudate.  EOMI, PERRLA. Mouth: lips without lesion/swelling.  Oral mucosa pink and moist. Oropharynx without erythema, exudate, or swelling.  Neck - No masses or thyromegaly or limitation in range of motion CV: RRR, no m/r/g.   LUNGS: CTA bilat, nonlabored resps, good aeration in all lung fields. EXT: no clubbing,  cyanosis, or edema.   LABS:  none  IMPRESSION AND PLAN:  1) Left calf rash, resolving.  I don' really know what this is but I recommended watchful waiting at this point in time.  2) Cough: likely combo of some PND and LPR.  Encouraged pt to restart his PPI and I also sent in tessalon perles 200 mg q8h prn.  An After Visit Summary was printed and given to the patient.  FOLLOW UP: prn  Signed:  Crissie Sickles, MD           01/05/2016

## 2016-01-15 ENCOUNTER — Other Ambulatory Visit: Payer: Self-pay | Admitting: Family Medicine

## 2016-03-28 ENCOUNTER — Ambulatory Visit (INDEPENDENT_AMBULATORY_CARE_PROVIDER_SITE_OTHER): Payer: 59 | Admitting: Family Medicine

## 2016-03-28 ENCOUNTER — Encounter: Payer: Self-pay | Admitting: Family Medicine

## 2016-03-28 VITALS — BP 126/80 | HR 73 | Temp 98.7°F | Wt 285.5 lb

## 2016-03-28 DIAGNOSIS — R739 Hyperglycemia, unspecified: Secondary | ICD-10-CM

## 2016-03-28 DIAGNOSIS — R202 Paresthesia of skin: Secondary | ICD-10-CM

## 2016-03-28 DIAGNOSIS — Z23 Encounter for immunization: Secondary | ICD-10-CM

## 2016-03-28 DIAGNOSIS — Z8639 Personal history of other endocrine, nutritional and metabolic disease: Secondary | ICD-10-CM

## 2016-03-28 LAB — HEMOGLOBIN A1C: HEMOGLOBIN A1C: 6.4 % (ref 4.6–6.5)

## 2016-03-28 NOTE — Progress Notes (Signed)
Pre visit review using our clinic review tool, if applicable. No additional management support is needed unless otherwise documented below in the visit note. 

## 2016-03-28 NOTE — Progress Notes (Signed)
Flu shot done.   R hand going numb.  No L hand sx.  No foot sx.  R handed.  Fingertips feel persistently numb.  Sx going on for about 1-2 week.  No weakness.  No hand pain.  H/o mild sugar elevation, he was worried about DM2.   Meds, vitals, and allergies reviewed.   ROS: Per HPI unless specifically indicated in ROS section   GEN: nad, alert and oriented HEENT: mucous membranes moist NECK: supple w/o LA CV: rrr.  no murmur PULM: ctab, no inc wob B hands with gross motor and sensation intact. Monofilament sensation intact. Normal radial pulses. Normal capillary refill. Phalen and Tinel's testing are negative at the wrist bilaterally.

## 2016-03-28 NOTE — Patient Instructions (Signed)
Likely early carpal tunnel symptoms.  Use an over the counter hard wrist brace.  Update me as needed.  Go to the lab on the way out.  We'll contact you with your lab report. Take care.  Glad to see you.

## 2016-03-30 ENCOUNTER — Encounter: Payer: Self-pay | Admitting: Family Medicine

## 2016-03-30 DIAGNOSIS — R202 Paresthesia of skin: Secondary | ICD-10-CM | POA: Insufficient documentation

## 2016-03-30 DIAGNOSIS — E1129 Type 2 diabetes mellitus with other diabetic kidney complication: Secondary | ICD-10-CM | POA: Insufficient documentation

## 2016-03-30 DIAGNOSIS — E119 Type 2 diabetes mellitus without complications: Secondary | ICD-10-CM | POA: Insufficient documentation

## 2016-03-30 NOTE — Assessment & Plan Note (Signed)
See notes on labs. 

## 2016-03-30 NOTE — Assessment & Plan Note (Signed)
Likely early carpal tunnel syndrome symptoms. Discussed with patient. Using over-the-counter cockup wrist brace and update me as me as needed. He agrees.

## 2016-06-09 LAB — HM DIABETES EYE EXAM

## 2016-07-15 ENCOUNTER — Encounter: Payer: Self-pay | Admitting: Internal Medicine

## 2016-07-15 ENCOUNTER — Ambulatory Visit (INDEPENDENT_AMBULATORY_CARE_PROVIDER_SITE_OTHER): Payer: 59 | Admitting: Internal Medicine

## 2016-07-15 VITALS — BP 128/80 | HR 92 | Temp 99.5°F | Wt 289.0 lb

## 2016-07-15 DIAGNOSIS — R05 Cough: Secondary | ICD-10-CM

## 2016-07-15 DIAGNOSIS — R059 Cough, unspecified: Secondary | ICD-10-CM

## 2016-07-15 DIAGNOSIS — Z20828 Contact with and (suspected) exposure to other viral communicable diseases: Secondary | ICD-10-CM | POA: Diagnosis not present

## 2016-07-15 LAB — POC INFLUENZA A&B (BINAX/QUICKVUE)
INFLUENZA A, POC: NEGATIVE
INFLUENZA B, POC: NEGATIVE

## 2016-07-15 MED ORDER — HYDROCODONE-HOMATROPINE 5-1.5 MG/5ML PO SYRP: 5.0000 mL | ORAL_SOLUTION | Freq: Three times a day (TID) | ORAL | 0 refills | Status: DC | PRN

## 2016-07-15 NOTE — Progress Notes (Signed)
HPI  Pt presents to the clinic today with c/o cough. This started suddenly last night. The cough is nonproductive. He is coughing so much that he gags. He did vomit x 1 last night after a coughing fit. He has run low grade fevers, had chills and body aches. He denies runny nose, nasal congestion, ear pain or sore throat. He denies any history of allergies or breathing problems. He has had sick contacts diagnosed with the flu. He did get his flu shot.  Review of Systems        Past Medical History:  Diagnosis Date  . History of heartburn   . HLD (hyperlipidemia)   . Leg cramps    history of  . Migraines    with photo and phonophobia  . Multiple thyroid nodules   . Plantar fasciitis    L foot, injected at foot center 2014  . Salmonella enteritis 2011   stool culture positive    Family History  Problem Relation Age of Onset  . Hypertension Mother   . Depression Mother     bipolar  . Diabetes Mother     diet controlled  . COPD Mother   . Thyroid disease Mother   . Diabetes Father   . Thyroid disease Father   . Heart disease Father   . Thyroid disease Brother   . Hypertension Brother   . Diabetes Brother   . Colon cancer Neg Hx   . Prostate cancer Neg Hx     Social History   Social History  . Marital status: Divorced    Spouse name: N/A  . Number of children: 1  . 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 Smoking status: Never Smoker  . Smokeless tobacco: Never Used  . Alcohol use 0.0 oz/week     Comment: occassionally  . Drug use: No  . Sexual activity: Not on file   Other Topics Concern  . Not on file   Social History Narrative   From [place], in [place] since 1996   Divorced x 2, 1 daughter with little contact.   Working 1st shift at [employer] 2016   Living with brother as of 2016    Allergies  Allergen Reactions  . Penicillins     REACTION: Rash, swelling as a child.      Constitutional: Positive fatigue and fever. Denies headache or abrupt weight changes.  HEENT:  Denies eye redness, eye pain, pressure behind the eyes, facial pain, nasal congestion, ear pain, ringing in the ears, wax buildup, runny nose or sore throat. Respiratory: Positive cough. Denies difficulty breathing or shortness of breath.  Cardiovascular: Denies chest pain, chest tightness, palpitations or swelling in the hands or feet.  Gastrointestinal: Positive for vomiting. Denies nausea, abdominal pain, diarrhea or blood in the stool.  No other specific complaints in a complete review of systems (except as listed in HPI above).  Objective:   BP 128/80   Pulse 92   Temp 99.5 F (37.5 C) (Oral)   Wt 289 lb (131.1 kg)   SpO2 97%   BMI 42.68 kg/m  Wt Readings from Last 3 Encounters:  07/15/16 289 lb (131.1 kg)  03/28/16 285 lb 8 oz (129.5 kg)  01/05/16 291 lb (132 kg)     General: Appears his stated age, obese in NAD. HEENT: Head: normal shape and size; Ears: Tm's gray and intact, normal light  reflex; Nose: mucosa pink and moist, septum midline; Throat/Mouth: Teeth present, mucosa pink and moist, no exudate noted, no lesions or ulcerations noted.  Neck: No cervical lymphadenopathy.  Cardiovascular: Tachycardic with normal rhythm. S1,S2 noted.  Pulmonary/Chest: Normal effort and positive vesicular breath sounds. No respiratory distress. No wheezes, rales or ronchi noted.       Assessment & Plan:   Cough:  Likely viral Rapid flu: negative Get some rest and drink plenty of water Rx for Hycodan cough syrup  RTC as needed or if symptoms persist.   Webb Silversmith, NP

## 2016-07-15 NOTE — Patient Instructions (Signed)
Cough, Adult Introduction A cough helps to clear your throat and lungs. A cough may last only 2-3 weeks (acute), or it may last longer than 8 weeks (chronic). Many different things can cause a cough. A cough may be a sign of an illness or another medical condition. Follow these instructions at home:  Pay attention to any changes in your cough.  Take medicines only as told by your doctor.  If you were prescribed an antibiotic medicine, take it as told by your doctor. Do not stop taking it even if you start to feel better.  Talk with your doctor before you try using a cough medicine.  Drink enough fluid to keep your pee (urine) clear or pale yellow.  If the air is dry, use a cold steam vaporizer or humidifier in your home.  Stay away from things that make you cough at work or at home.  If your cough is worse at night, try using extra pillows to raise your head up higher while you sleep.  Do not smoke, and try not to be around smoke. If you need help quitting, ask your doctor.  Do not have caffeine.  Do not drink alcohol.  Rest as needed. Contact a doctor if:  You have new problems (symptoms).  You cough up yellow fluid (pus).  Your cough does not get better after 2-3 weeks, or your cough gets worse.  Medicine does not help your cough and you are not sleeping well.  You have pain that gets worse or pain that is not helped with medicine.  You have a fever.  You are losing weight and you do not know why.  You have night sweats. Get help right away if:  You cough up blood.  You have trouble breathing.  Your heartbeat is very fast. This information is not intended to replace advice given to you by your health care provider. Make sure you discuss any questions you have with your health care provider. Document Released: 02/06/2011 Document Revised: 11/01/2015 Document Reviewed: 08/02/2014  2017 [employer]vier  

## 2016-07-15 NOTE — Addendum Note (Signed)
Addended by: Lurlean Nanny on: 07/15/2016 09:46 AM   Modules accepted: Orders

## 2016-07-20 ENCOUNTER — Other Ambulatory Visit: Payer: Self-pay | Admitting: Family Medicine

## 2016-07-23 ENCOUNTER — Encounter: Payer: Self-pay | Admitting: *Deleted

## 2016-07-23 ENCOUNTER — Emergency Department
Admission: EM | Admit: 2016-07-23 | Discharge: 2016-07-23 | Disposition: A | Discharge status: Home / Self Care | Payer: 59 | Attending: Emergency Medicine | Admitting: Emergency Medicine

## 2016-07-23 ENCOUNTER — Emergency Department: Payer: 59

## 2016-07-23 DIAGNOSIS — J111 Influenza due to unidentified influenza virus with other respiratory manifestations: Secondary | ICD-10-CM | POA: Insufficient documentation

## 2016-07-23 DIAGNOSIS — R05 Cough: Secondary | ICD-10-CM | POA: Diagnosis present

## 2016-07-23 MED ORDER — METHYLPREDNISOLONE SODIUM SUCC 125 MG IJ SOLR
125.0000 mg | Freq: Once | INTRAMUSCULAR | Status: AC
  Administered 2016-07-23: 125 mg via INTRAMUSCULAR

## 2016-07-23 MED ORDER — BENZONATATE 100 MG PO CAPS: 100.0000 mg | ORAL_CAPSULE | Freq: Three times a day (TID) | ORAL | 0 refills | Status: AC | PRN

## 2016-07-23 MED ORDER — PREDNISONE 10 MG (21) PO TBPK: 10.0000 mg | ORAL_TABLET | Freq: Every day | ORAL | 0 refills | Status: DC

## 2016-07-23 MED ORDER — METHYLPREDNISOLONE SODIUM SUCC 125 MG IJ SOLR
125.0000 mg | Freq: Once | INTRAMUSCULAR | Status: DC
  Filled 2016-07-23: qty 2

## 2016-07-23 MED ORDER — BENZONATATE 100 MG PO CAPS
200.0000 mg | ORAL_CAPSULE | Freq: Once | ORAL | Status: AC
  Administered 2016-07-23: 200 mg via ORAL
  Filled 2016-07-23: qty 2

## 2016-07-23 NOTE — ED Provider Notes (Signed)
[facility] Emergency Department Provider Note  ____________________________________________  Time seen: Approximately 9:29 PM  I have reviewed the triage vital signs and the nursing notes.   HISTORY  Chief Complaint Cough and Sore Throat    HPI Kyle Murphy is a 41 y.o. male presenting to the emergency department with  nonproductive cough, congestion, headache, rhinorrhea, shortness of breath, fatigue and myalgias for the past week. Patient is unsure if he has had fever. Patient's 63 year old daughter was diagnosed with influenza last week. Patient states that he tested negative for influenza. However, he was tested on the same day that his symptoms started. Patient states that he has been staying hydrated. He has experienced diminished appetite. No recent travel. Patient denies chest pain, chest tightness, shortness of breath, abdominal pain, nausea and vomiting. Patient has been taking Tussionex for cough.     Past Medical History:  Diagnosis Date  . History of heartburn   . HLD (hyperlipidemia)   . Leg cramps    history of  . Migraines    with photo and phonophobia  . Multiple thyroid nodules   . Plantar fasciitis    L foot, injected at foot center 2014  . Salmonella enteritis 2011   stool culture positive    Patient Active Problem List   Diagnosis Date Noted  . Hyperglycemia 03/30/2016  . Paresthesia 03/30/2016  . Advance care planning 08/09/2014  . GERD (gastroesophageal reflux disease) 08/09/2014  . Plantar fasciitis 09/23/2013  . Routine general medical examination at a health care facility 04/01/2011  . Hyperlipidemia 03/28/2010  . Multinodular goiter 03/28/2010    Past Surgical History:  Procedure Laterality Date  . CHOLECYSTECTOMY    . TONSILLECTOMY      Prior to Admission medications   Medication Sig Start Date End Date Taking? Authorizing Provider  benzonatate (TESSALON PERLES) 100 MG capsule Take 1 capsule (100 mg total) by  mouth 3 (three) times daily as needed for cough. 07/23/16 07/30/16  Conley [place], PA-C  HYDROcodone-homatropine (HYCODAN) 5-1.5 MG/5ML syrup Take 5 mLs by mouth every 8 (eight) hours as needed for cough. 07/15/16   Jearld Fenton, NP  Multiple Vitamin (MULTIVITAMIN) tablet Take 1 tablet by mouth daily.    Historical Provider, MD  omeprazole (PRILOSEC) 20 MG capsule TAKE 1 CAPSULE BY MOUTH DAILY 07/21/16   Tonia Ghent, MD  predniSONE (STERAPRED UNI-PAK 21 TAB) 10 MG (21) TBPK tablet Take 1 tablet (10 mg total) by mouth daily. Take 6 tablets the first day, take 5 tablets the second day, take 4 tablets the third day, take 3 tablets the fourth day, take 2 tablets the fifth day, take 1 tablet the sixth day. 07/23/16   Lannie Fields, PA-C    Allergies Penicillins  Family History  Problem Relation Age of Onset  . Hypertension Mother   . Depression Mother     bipolar  . Diabetes Mother     diet controlled  . COPD Mother   . Thyroid disease Mother   . Diabetes Father   . Thyroid disease Father   . Heart disease Father   . Thyroid disease Brother   . Hypertension Brother   . Diabetes Brother   . Colon cancer Neg Hx   . Prostate cancer Neg Hx     Social History Social History  Substance Use Topics  . Smoking status: Never Smoker  . Smokeless tobacco: Never Used  . Alcohol use No     Comment: occassionally  Review of Systems  Constitutional: Patient has had fever.  Eyes: No visual changes. No discharge ENT: Patient has had congestion.  Cardiovascular: no chest pain. Respiratory: Patient has had non-productive cough.  No SOB. Gastrointestinal: Patient has had nausea.  Genitourinary: Negative for dysuria. No hematuria Musculoskeletal: Patient has had myalgias. Skin: Negative for rash, abrasions, lacerations, ecchymosis. Neurological: Patient has had headaches, no focal weakness or numbness.   ____________________________________________   PHYSICAL EXAM:  VITAL  SIGNS: ED Triage Vitals [07/23/16 2029]  Enc Vitals Group     BP (!) 148/88     Pulse Rate 86     Resp 20     Temp 98.9 F (37.2 C)     Temp Source Oral     SpO2 96 %     Weight 290 lb (131.5 kg)     Height 5\' 10"  (1.778 m)     Head Circumference      Peak Flow      Pain Score      Pain Loc      Pain Edu?      Excl. in [place]?      Constitutional: Alert and oriented. Patient is sitting upright.  Eyes: Conjunctivae are normal. PERRL. EOMI. Head: Atraumatic. ENT:      Ears: Tympanic membranes are injected bilaterally without evidence of effusion or purulent exudate. Bony landmarks are visualized bilaterally. No pain with palpation at the tragus.      Nose: Nasal turbinates are edematous and erythematous. Trace rhinorrhea visualized.      Mouth/Throat: Mucous membranes are moist. Posterior pharynx is mildly erythematous. Patient has ulceration visualized at hard palate. No tonsillar hypertrophy or purulent exudate. Uvula is midline. Neck: Full range of motion. No pain is elicited with flexion at the neck. Hematological/Lymphatic/Immunilogical: No cervical lymphadenopathy. Cardiovascular: Normal rate, regular rhythm. Normal S1 and S2.  Good peripheral circulation. Respiratory: Normal respiratory effort without tachypnea or retractions. Lungs CTAB. Good air entry to the bases with no decreased or absent breath sounds. Gastrointestinal: Bowel sounds 4 quadrants. Soft and nontender to palpation. No guarding or rigidity. No palpable masses. No distention. No CVA tenderness.  Skin:  Skin is warm, dry and intact. No rash noted. Psychiatric: Mood and affect are normal. Speech and behavior are normal. Patient exhibits appropriate insight and judgement. ____________________________________________   LABS (all labs ordered are listed, but only abnormal results are displayed)  Labs Reviewed - No data to  display ____________________________________________  EKG   ____________________________________________  RADIOLOGY Unk Pinto, personally viewed and evaluated these images (plain radiographs) as part of my medical decision making, as well as reviewing the written report by the radiologist.    Dg Chest 2 View  Result Date: 07/23/2016 CLINICAL DATA:  Cough for 1 week.  History of hyperlipidemia. EXAM: CHEST  2 VIEW COMPARISON:  None. FINDINGS: Cardiomediastinal silhouette is normal. No pleural effusions or focal consolidations. Mild bronchitic changes. Trachea projects midline and there is no pneumothorax. Soft tissue planes and included osseous structures are non-suspicious. IMPRESSION: Mild bronchitic changes without focal consolidation. Electronically Signed   By: Elon Alas M.D.   On: 07/23/2016 22:00    ____________________________________________    PROCEDURES  Procedure(s) performed:    Procedures    Medications  benzonatate (TESSALON) capsule 200 mg (200 mg Oral Given 07/23/16 2145)  methylPREDNISolone sodium succinate (SOLU-MEDROL) 125 mg/2 mL injection 125 mg (125 mg Intramuscular Given 07/23/16 2145)     ____________________________________________   INITIAL IMPRESSION / ASSESSMENT AND PLAN /  ED COURSE  Pertinent labs & imaging results that were available during my care of the patient were reviewed by me and considered in my medical decision making (see chart for details).  Review of the [place] CSRS was performed in accordance of the [place] prior to dispensing any controlled drugs.    Assessment and Plan:  Influenza: Patient presents to the emergency department with headache, rhinorrhea, congestion, myalgias, fatigue and shortness of breath. DG chest indicates findings consistent with bronchitis. Patient's symptoms are consistent with influenza. Solumedrol was given in the emergency department. Patient was discharged with tapered prednisone. Tamiflu  was not prescribed as it is not therapeutically efficacious given duration of patient's symptoms. Rest and hydration were encouraged. Patient was advised to follow-up with his primary care provider in one week. Physical exam vital signs are reassuring at this time. All patient questions were answered.  ____________________________________________  FINAL CLINICAL IMPRESSION(S) / ED DIAGNOSES  Final diagnoses:  Influenza      NEW MEDICATIONS STARTED DURING THIS VISIT:  Discharge Medication List as of 07/23/2016 10:35 PM    START taking these medications   Details  predniSONE (STERAPRED UNI-PAK 21 TAB) 10 MG (21) TBPK tablet Take 1 tablet (10 mg total) by mouth daily. Take 6 tablets the first day, take 5 tablets the second day, take 4 tablets the third day, take 3 tablets the fourth day, take 2 tablets the fifth day, take 1 tablet the sixth day., Starting Wed 07/23/2016, Prin t            This chart was dictated using voice recognition software/Dragon. Despite best efforts to proofread, errors can occur which can change the meaning. Any change was purely unintentional.    Lannie Fields, PA-C 07/24/16 0015    Lisa Roca, MD 07/26/16 [phone]

## 2016-07-23 NOTE — ED Notes (Signed)
See triage note, pt states he was tested negative for flu last week however symptoms have not improved. Pt reports cough, dry cough noted at this time. Pt also reports sore throat.

## 2016-07-23 NOTE — ED Triage Notes (Addendum)
Pt reports a cough, sore throat since last week.  Pt had negative flu swab last week.  Pt taking otc meds without relief.

## 2016-07-25 ENCOUNTER — Ambulatory Visit (INDEPENDENT_AMBULATORY_CARE_PROVIDER_SITE_OTHER): Payer: 59 | Admitting: Family Medicine

## 2016-07-25 ENCOUNTER — Encounter: Payer: Self-pay | Admitting: Family Medicine

## 2016-07-25 DIAGNOSIS — R059 Cough, unspecified: Secondary | ICD-10-CM

## 2016-07-25 DIAGNOSIS — R05 Cough: Secondary | ICD-10-CM | POA: Diagnosis not present

## 2016-07-25 DIAGNOSIS — F419 Anxiety disorder, unspecified: Secondary | ICD-10-CM

## 2016-07-25 MED ORDER — ESCITALOPRAM OXALATE 10 MG PO TABS: 10.0000 mg | ORAL_TABLET | Freq: Every day | ORAL | 1 refills | Status: DC

## 2016-07-25 NOTE — Progress Notes (Signed)
Mild bronchitic changes without focal consolidation on cxr, prev noted and d/w pt.    Prev with fever--->cough---> vomiting- --->to ER.  Tessalon and prednisone rx given to patient.  He feels some better in the meantime.    Anxiety.  He gets shaky.  FH anxiety and depression, BAD.   This has gone on for years.  No clear manic episodes.  No SI/HI.  He gets panicked.  Sx worse over the years.  He can get claustrophobic.   No etoh.  Not smoking.  No illicits.    PMH and SH reviewed  ROS: Per HPI unless specifically indicated in ROS section   Meds, vitals, and allergies reviewed.   GEN: nad, alert and oriented HEENT: mucous membranes moist, OP with cobblestoning, no ulceration.  NECK: supple w/o LA CV: rrr.   PULM: ctab, no inc wob EXT: no edema Affect speech and judgement wnl.

## 2016-07-25 NOTE — Patient Instructions (Signed)
Start lexapro and update me in about 2 weeks, sooner if needed.  Take care.  Glad to see you.

## 2016-07-26 ENCOUNTER — Encounter: Payer: Self-pay | Admitting: Family Medicine

## 2016-07-26 DIAGNOSIS — F419 Anxiety disorder, unspecified: Secondary | ICD-10-CM | POA: Insufficient documentation

## 2016-07-26 DIAGNOSIS — F418 Other specified anxiety disorders: Secondary | ICD-10-CM | POA: Insufficient documentation

## 2016-07-26 NOTE — Assessment & Plan Note (Signed)
Okay for outpatient f/u.  D/w pt about SSRI, sx are bothersome enough to treat but not SI/HI.  He agrees.  Routine cautions and SSRI timeline d/w pt.  He'll update me in about two weeks, sooner if needed.  He agrees.

## 2016-07-26 NOTE — Assessment & Plan Note (Signed)
Improved, continue current meds, d/w pt.  He agrees.  Nontoxic.

## 2016-08-10 ENCOUNTER — Other Ambulatory Visit: Payer: Self-pay | Admitting: Family Medicine

## 2016-08-10 DIAGNOSIS — E042 Nontoxic multinodular goiter: Secondary | ICD-10-CM

## 2016-08-15 ENCOUNTER — Encounter: Payer: Self-pay | Admitting: Family Medicine

## 2016-08-15 ENCOUNTER — Ambulatory Visit (INDEPENDENT_AMBULATORY_CARE_PROVIDER_SITE_OTHER): Payer: 59 | Admitting: Family Medicine

## 2016-08-15 VITALS — BP 124/82 | HR 71 | Temp 98.2°F | Ht 68.5 in | Wt 283.8 lb

## 2016-08-15 DIAGNOSIS — E042 Nontoxic multinodular goiter: Secondary | ICD-10-CM | POA: Diagnosis not present

## 2016-08-15 DIAGNOSIS — E119 Type 2 diabetes mellitus without complications: Secondary | ICD-10-CM

## 2016-08-15 DIAGNOSIS — Z0000 Encounter for general adult medical examination without abnormal findings: Secondary | ICD-10-CM | POA: Diagnosis not present

## 2016-08-15 DIAGNOSIS — F419 Anxiety disorder, unspecified: Secondary | ICD-10-CM

## 2016-08-15 DIAGNOSIS — R739 Hyperglycemia, unspecified: Secondary | ICD-10-CM | POA: Diagnosis not present

## 2016-08-15 DIAGNOSIS — Z23 Encounter for immunization: Secondary | ICD-10-CM

## 2016-08-15 DIAGNOSIS — Z119 Encounter for screening for infectious and parasitic diseases, unspecified: Secondary | ICD-10-CM

## 2016-08-15 LAB — HIV ANTIBODY (ROUTINE TESTING W REFLEX): HIV 1&2 Ab, 4th Generation: NONREACTIVE

## 2016-08-15 LAB — TSH: TSH: 1.56 u[IU]/mL (ref 0.35–4.50)

## 2016-08-15 LAB — HEMOGLOBIN A1C: Hgb A1c MFr Bld: 6.6 % — ABNORMAL HIGH (ref 4.6–6.5)

## 2016-08-15 LAB — LIPID PANEL
CHOL/HDL RATIO: 5
Cholesterol: 185 mg/dL (ref 0–200)
HDL: 33.9 mg/dL — AB (ref >39.00)
LDL CALC: 133 mg/dL — AB (ref 0–99)
NonHDL: 151.09
Triglycerides: 91 mg/dL (ref 0.0–149.0)
VLDL: 18.2 mg/dL (ref 0.0–40.0)

## 2016-08-15 LAB — BASIC METABOLIC PANEL
BUN: 12 mg/dL (ref 6–23)
CHLORIDE: 104 meq/L (ref 96–112)
CO2: 27 meq/L (ref 19–32)
CREATININE: 0.83 mg/dL (ref 0.40–1.50)
Calcium: 9.3 mg/dL (ref 8.4–10.5)
GFR: 108.39 mL/min (ref >60.00)
Glucose, Bld: 103 mg/dL — ABNORMAL HIGH (ref 70–99)
Potassium: 4 mEq/L (ref 3.5–5.1)
Sodium: 140 mEq/L (ref 135–145)

## 2016-08-15 NOTE — Patient Instructions (Addendum)
Try to keep your hand dry and clean.  The strips should gradually curl up.  Trim the ends as needed but try not to pull off.   Then cover with bandaid if needed.   Go to the lab on the way out.  We'll contact you with your lab report. Take care.  Glad to see you.

## 2016-08-15 NOTE — Progress Notes (Signed)
Pre visit review using our clinic review tool, if applicable. No additional management support is needed unless otherwise documented below in the visit note. 

## 2016-08-15 NOTE — Progress Notes (Signed)
CPE- See plan.  Routine anticipatory guidance given to patient.  See health maintenance.  The possibility exists that previously documented standard health maintenance information may have been brought forward from a previous encounter into this note.  If needed, that same information has been updated to reflect the current situation based on today's encounter.    Tetanus 2011 Flu 2017 PNA and shingles not due Colon cancer screening and PSA not due.   Living will d/w pt.  Mother designated if patient were incapacitated.   Diet and exercise d/w pt.  Needs weight loss.  He was going to get back in the gym but work schedule disrupted that.  Diet encouraged.  He lost a few lbs in the last few months.  Encouraged gradual weight loss  Pt opts in for HIV screening with next set of labs.  D/w pt re: routine screening.   Due for routine labs, including TSH, d/w pt.  H/o hyperglycemia noted with A1c 6.4.    L extensor 2nd finger with ~2cm lac with normal ROM and sensation.  Cut this AM, about 5 hours ago.  He cleaned and bandaged the area.  Looks superficial, not gaping, not bleeding, clean appearing.  D/w pt.  rebandaged with steristrips.  D/w pt.    Lexapro is helping his mood.  "I'm not freaking out now."  Less panic sx.  Sleeping better.  Not drowsy.  No SI/HI.  Still working 3rd shift, takes after after work shift.    PMH and SH reviewed  Meds, vitals, and allergies reviewed.   ROS: Per HPI.  Unless specifically indicated otherwise in HPI, the patient denies:  General: fever. Eyes: acute vision changes ENT: sore throat Cardiovascular: chest pain Respiratory: SOB GI: vomiting GU: dysuria Musculoskeletal: acute back pain Derm: acute rash Neuro: acute motor dysfunction Psych: worsening mood Endocrine: polydipsia Heme: bleeding Allergy: hayfever  GEN: nad, alert and oriented HEENT: mucous membranes moist NECK: supple w/o LA CV: rrr. PULM: ctab, no inc wob ABD: soft, +bs EXT: no  edema SKIN: no acute rash but L extensor 2nd finger with ~2cm lac with normal ROM and sensation.  Appears superficial. Clean and not bleeding. Distally neurovascularly intact. Discussed with patient about options. Likely would not need suture. Well approximated with Steri-Strips. Covered with a dressing. Update me as needed. He previously clean the wound as soon as he was cut.

## 2016-08-17 NOTE — Assessment & Plan Note (Signed)
See notes on labs. 

## 2016-08-17 NOTE — Assessment & Plan Note (Signed)
Controlled. Continue as is. No adverse effect on medication. He notes that the Lexapro is helping.

## 2016-08-17 NOTE — Assessment & Plan Note (Addendum)
Tetanus 2011 Flu 2017 PNA and shingles not due Colon cancer screening and PSA not due.   Living will d/w pt.  Mother designated if patient were incapacitated.   Diet and exercise d/w pt.  Needs weight loss.  He was going to get back in the gym but work schedule disrupted that.  Diet encouraged.  He lost a few lbs in the last few months.  Encouraged gradual weight loss  Pt opts in for HIV screening with next set of labs.  D/w pt re: routine screening.   Due for routine labs, including TSH, d/w pt.  H/o hyperglycemia noted with A1c 6.4.   The superficial laceration his hand should heal without complication. Discussed with patient. He agrees.

## 2016-11-17 ENCOUNTER — Other Ambulatory Visit: Payer: Self-pay | Admitting: Family Medicine

## 2016-11-17 DIAGNOSIS — E1065 Type 1 diabetes mellitus with hyperglycemia: Secondary | ICD-10-CM

## 2016-11-21 ENCOUNTER — Other Ambulatory Visit (INDEPENDENT_AMBULATORY_CARE_PROVIDER_SITE_OTHER): Payer: 59

## 2016-11-21 DIAGNOSIS — E1065 Type 1 diabetes mellitus with hyperglycemia: Secondary | ICD-10-CM | POA: Diagnosis not present

## 2016-11-21 LAB — HEMOGLOBIN A1C: Hgb A1c MFr Bld: 6.4 % (ref 4.6–6.5)

## 2016-11-25 ENCOUNTER — Encounter: Payer: Self-pay | Admitting: Family Medicine

## 2016-11-25 ENCOUNTER — Ambulatory Visit (INDEPENDENT_AMBULATORY_CARE_PROVIDER_SITE_OTHER): Payer: 59 | Admitting: Family Medicine

## 2016-11-25 VITALS — BP 122/82 | HR 73 | Temp 98.4°F | Wt 276.0 lb

## 2016-11-25 DIAGNOSIS — F419 Anxiety disorder, unspecified: Secondary | ICD-10-CM

## 2016-11-25 DIAGNOSIS — Z8639 Personal history of other endocrine, nutritional and metabolic disease: Secondary | ICD-10-CM

## 2016-11-25 LAB — MICROALBUMIN / CREATININE URINE RATIO
CREATININE, U: 379.2 mg/dL
MICROALB/CREAT RATIO: 0.6 mg/g (ref 0.0–30.0)
Microalb, Ur: 2.1 mg/dL — ABNORMAL HIGH (ref 0.0–1.9)

## 2016-11-25 MED ORDER — ESCITALOPRAM OXALATE 10 MG PO TABS: 5.0000 mg | ORAL_TABLET | Freq: Every day | ORAL | Status: DC

## 2016-11-25 NOTE — Progress Notes (Signed)
Diabetic hx.  Now with A1c <6.5.  No meds Hypoglycemic episodes: only if prolonged fasting, resolves with snack, only happened once, d/w pt.  Hyperglycemic episodes: no Feet problems: not tingling, but pain related to working on concrete floor.  He is going to get new shoes soon.   Blood Sugars averaging: not checked often eye exam within last year: up to date, no retinopathy per patient report.  06/2016.   A1c d/w pt.  Slightly better, Dm2 hx d/w pt.  Now meets criteria for hx of DM2.    Mood d/w pt. Still on lexapro.  Mood is "fine".  Sleeping better, but with sedation at 10mg .  Does better on 5mg  tab.  Mood is still okay with lower dose.  No SI/HI.  PHQ2= 0, d/w pt.   Meds, vitals, and allergies reviewed.   ROS: Per HPI unless specifically indicated in ROS section   GEN: nad, alert and oriented HEENT: mucous membranes moist NECK: supple w/o LA CV: rrr. PULM: ctab, no inc wob ABD: soft, +bs EXT: no edema SKIN: no acute rash, skin tag noted in the axilla.   Diabetic foot exam: Normal inspection No skin breakdown No calluses  Normal DP pulses Normal sensation to light touch and monofilament Nails normal

## 2016-11-25 NOTE — Patient Instructions (Addendum)
The diabetes classes would be a great idea.   Recheck A1c in about 3 months prior to a visit.  Go to the lab on the way out.  We'll contact you with your lab report. Kyle Murphy will call about your referral. If the skin tags keep bothering you, then I can cut them off later on.  Take care.  Glad to see you.

## 2016-11-26 NOTE — Assessment & Plan Note (Signed)
Mood is good but he was too drowsy with 10 mg of Lexapro. He does better on 5 mg. Reasonable to continue as is. He agrees.

## 2016-11-26 NOTE — Assessment & Plan Note (Signed)
We should still treat him like he is diabetic except for not starting additional medication at this point. Discussed with patient about diet and exercise. Discussed routine eye exam, check microalbumin. Labs discussed with patient. Recheck A1c in a few months. He agrees.

## 2016-11-30 ENCOUNTER — Other Ambulatory Visit: Payer: Self-pay | Admitting: Family Medicine

## 2016-11-30 DIAGNOSIS — Z8639 Personal history of other endocrine, nutritional and metabolic disease: Secondary | ICD-10-CM

## 2016-11-30 DIAGNOSIS — R809 Proteinuria, unspecified: Secondary | ICD-10-CM

## 2017-01-17 ENCOUNTER — Other Ambulatory Visit: Payer: Self-pay | Admitting: Family Medicine

## 2017-02-24 ENCOUNTER — Other Ambulatory Visit (INDEPENDENT_AMBULATORY_CARE_PROVIDER_SITE_OTHER): Payer: 59

## 2017-02-24 DIAGNOSIS — Z8639 Personal history of other endocrine, nutritional and metabolic disease: Secondary | ICD-10-CM

## 2017-02-24 DIAGNOSIS — R809 Proteinuria, unspecified: Secondary | ICD-10-CM

## 2017-02-24 LAB — MICROALBUMIN / CREATININE URINE RATIO
Creatinine,U: 210.9 mg/dL
Microalb Creat Ratio: 0.3 mg/g (ref 0.0–30.0)
Microalb, Ur: <0.7 mg/dL (ref 0.0–1.9)

## 2017-02-24 LAB — HEMOGLOBIN A1C: HEMOGLOBIN A1C: 6.5 % (ref 4.6–6.5)

## 2017-02-27 ENCOUNTER — Encounter: Payer: Self-pay | Admitting: Family Medicine

## 2017-02-27 ENCOUNTER — Ambulatory Visit (INDEPENDENT_AMBULATORY_CARE_PROVIDER_SITE_OTHER): Payer: BLUE CROSS/BLUE SHIELD | Admitting: Family Medicine

## 2017-02-27 VITALS — BP 126/76 | HR 75 | Temp 98.7°F | Wt 284.8 lb

## 2017-02-27 DIAGNOSIS — E119 Type 2 diabetes mellitus without complications: Secondary | ICD-10-CM | POA: Diagnosis not present

## 2017-02-27 DIAGNOSIS — Z23 Encounter for immunization: Secondary | ICD-10-CM | POA: Diagnosis not present

## 2017-02-27 NOTE — Patient Instructions (Signed)
Use talc on your feet before work.  Use tinactin on the rash.  If needed, you can change out to OTC ketoconazole.  Change socks in the middle of your shift if needed.    Recheck in about 3-4 months. Labs prior to a 42min visit.  We'll go over your diabetes labs and take off any skin tags if still present.  Take care.  Glad to see you.  Update me as needed.

## 2017-02-27 NOTE — Progress Notes (Signed)
Diabetes:  No meds.  Hypoglycemic episodes: only if prolonged fasting.   Hyperglycemic episodes:no Feet problems: no Blood Sugars averaging:not checked.   eye exam within last year: yes, earlier in 2018 Labs d/w pt.  MALB neg, A1c at goal.    He is working a new 12 hour shift at [employer].  Working 7PM to [employer].  He has 4 shifts 1 week, then 3 the next.  He has some days when he tries to reset and sleep at night.  He has a set shift but a swingshift lifestyle.  He would like to get on dayshift at some point.   D/w pt about trying to get some exercise at some point.  He has a difficult schedule.    Mood is good.    Meds, vitals, and allergies reviewed.   ROS: Per HPI unless specifically indicated in ROS section   GEN: nad, alert and oriented HEENT: mucous membranes moist NECK: supple w/o LA CV: rrr. PULM: ctab, no inc wob ABD: soft, +bs EXT: no edema Skin tags noted in the B axilla.    Diabetic foot exam: Normal inspection except for tinea on the plantar side.   No skin breakdown No calluses  Normal DP pulses Normal sensation to light touch and monofilament Nails normal

## 2017-03-01 NOTE — Assessment & Plan Note (Signed)
MALB neg, A1c at goal.  No new meds needed. Goal is still working on weight with diet and exercise with this complicated by his work schedule. Discussed coping strategies. Recheck periodically. See future orders. See after visit summary. Discussed with patient about foot care.

## 2017-06-18 ENCOUNTER — Other Ambulatory Visit: Payer: BLUE CROSS/BLUE SHIELD

## 2017-06-22 ENCOUNTER — Ambulatory Visit: Payer: BLUE CROSS/BLUE SHIELD | Admitting: Family Medicine

## 2017-07-13 ENCOUNTER — Other Ambulatory Visit (INDEPENDENT_AMBULATORY_CARE_PROVIDER_SITE_OTHER): Payer: 59

## 2017-07-13 DIAGNOSIS — E119 Type 2 diabetes mellitus without complications: Secondary | ICD-10-CM

## 2017-07-13 LAB — BASIC METABOLIC PANEL
BUN: 10 mg/dL (ref 6–23)
CHLORIDE: 103 meq/L (ref 96–112)
CO2: 28 mEq/L (ref 19–32)
CREATININE: 0.84 mg/dL (ref 0.40–1.50)
Calcium: 8.8 mg/dL (ref 8.4–10.5)
GFR: 106.43 mL/min (ref >60.00)
GLUCOSE: 103 mg/dL — AB (ref 70–99)
POTASSIUM: 4.2 meq/L (ref 3.5–5.1)
Sodium: 139 mEq/L (ref 135–145)

## 2017-07-13 LAB — HEMOGLOBIN A1C: Hgb A1c MFr Bld: 6.8 % — ABNORMAL HIGH (ref 4.6–6.5)

## 2017-07-13 LAB — LIPID PANEL
CHOLESTEROL: 152 mg/dL (ref 0–200)
HDL: 37.7 mg/dL — AB (ref >39.00)
LDL CALC: 98 mg/dL (ref 0–99)
NonHDL: 114.41
TRIGLYCERIDES: 82 mg/dL (ref 0.0–149.0)
Total CHOL/HDL Ratio: 4
VLDL: 16.4 mg/dL (ref 0.0–40.0)

## 2017-07-14 ENCOUNTER — Emergency Department
Admission: EM | Admit: 2017-07-14 | Discharge: 2017-07-14 | Disposition: A | Discharge status: Home / Self Care | Payer: 59 | Attending: Emergency Medicine | Admitting: Emergency Medicine

## 2017-07-14 ENCOUNTER — Emergency Department: Payer: 59

## 2017-07-14 ENCOUNTER — Ambulatory Visit: Payer: Self-pay

## 2017-07-14 DIAGNOSIS — E119 Type 2 diabetes mellitus without complications: Secondary | ICD-10-CM | POA: Insufficient documentation

## 2017-07-14 DIAGNOSIS — R109 Unspecified abdominal pain: Secondary | ICD-10-CM

## 2017-07-14 DIAGNOSIS — R1032 Left lower quadrant pain: Secondary | ICD-10-CM | POA: Diagnosis not present

## 2017-07-14 LAB — URINALYSIS, COMPLETE (UACMP) WITH MICROSCOPIC
BILIRUBIN URINE: NEGATIVE
Bacteria, UA: NONE SEEN
GLUCOSE, UA: NEGATIVE mg/dL
Ketones, ur: NEGATIVE mg/dL
LEUKOCYTES UA: NEGATIVE
NITRITE: NEGATIVE
PH: 7 (ref 5.0–8.0)
Protein, ur: NEGATIVE mg/dL
SPECIFIC GRAVITY, URINE: 1.017 (ref 1.005–1.030)
Squamous Epithelial / LPF: NONE SEEN

## 2017-07-14 LAB — CBC WITH DIFFERENTIAL/PLATELET
Basophils Absolute: 0.1 10*3/uL (ref 0–0.1)
Basophils Relative: 1 %
EOS ABS: 0.2 10*3/uL (ref 0–0.7)
EOS PCT: 2 %
HCT: 45 % (ref 40.0–52.0)
Hemoglobin: 15 g/dL (ref 13.0–18.0)
LYMPHS ABS: 2.1 10*3/uL (ref 1.0–3.6)
LYMPHS PCT: 19 %
MCH: 29.9 pg (ref 26.0–34.0)
MCHC: 33.3 g/dL (ref 32.0–36.0)
MCV: 89.9 fL (ref 80.0–100.0)
MONO ABS: 0.9 10*3/uL (ref 0.2–1.0)
MONOS PCT: 8 %
Neutro Abs: 7.8 10*3/uL — ABNORMAL HIGH (ref 1.4–6.5)
Neutrophils Relative %: 70 %
PLATELETS: 256 10*3/uL (ref 150–440)
RBC: 5 MIL/uL (ref 4.40–5.90)
RDW: 14.1 % (ref 11.5–14.5)
WBC: 11.2 10*3/uL — ABNORMAL HIGH (ref 3.8–10.6)

## 2017-07-14 LAB — COMPREHENSIVE METABOLIC PANEL
ALT: 26 U/L (ref 17–63)
ANION GAP: 6 (ref 5–15)
AST: 22 U/L (ref 15–41)
Albumin: 3.9 g/dL (ref 3.5–5.0)
Alkaline Phosphatase: 66 U/L (ref 38–126)
BUN: 14 mg/dL (ref 6–20)
CALCIUM: 8.9 mg/dL (ref 8.9–10.3)
CHLORIDE: 105 mmol/L (ref 101–111)
CO2: 29 mmol/L (ref 22–32)
CREATININE: 0.85 mg/dL (ref 0.61–1.24)
GFR calc Af Amer: >60 mL/min (ref >60)
Glucose, Bld: 100 mg/dL — ABNORMAL HIGH (ref 65–99)
Potassium: 4.1 mmol/L (ref 3.5–5.1)
SODIUM: 140 mmol/L (ref 135–145)
Total Bilirubin: 0.7 mg/dL (ref 0.3–1.2)
Total Protein: 7.6 g/dL (ref 6.5–8.1)

## 2017-07-14 MED ORDER — KETOROLAC TROMETHAMINE 30 MG/ML IJ SOLN
30.0000 mg | Freq: Once | INTRAMUSCULAR | Status: AC
  Administered 2017-07-14: 30 mg via INTRAVENOUS
  Filled 2017-07-14: qty 1

## 2017-07-14 MED ORDER — OXYCODONE-ACETAMINOPHEN 5-325 MG PO TABS: 1.0000 | ORAL_TABLET | Freq: Three times a day (TID) | ORAL | 0 refills | Status: DC | PRN

## 2017-07-14 MED ORDER — PREDNISONE 10 MG (21) PO TBPK: ORAL_TABLET | Freq: Every day | ORAL | 0 refills | Status: DC

## 2017-07-14 NOTE — ED Notes (Signed)
First nurse note  Presents with left flank pain which is moving into groin  And having diff urination

## 2017-07-14 NOTE — ED Triage Notes (Signed)
Pt reports that he is able to void, but states that he feels like he is not urinating enough.  Pt also reports flank pain that radiates into his groin.  Pt denies hx of kidney stones and denies hx of prostate problems.  Pt is A&Ox4, in NAD at this time.

## 2017-07-14 NOTE — Telephone Encounter (Signed)
Pt. Called c/o back pain that radiates into the front down to pubic area. States his employer did a urine screen last week and he had blood in his urine. Also is having a difficult time urinating. No availability at [facility] or [facility]. Offered appointment in [place]. Pt. States he will go to ED.

## 2017-07-14 NOTE — ED Notes (Signed)
Esign not working, pt verbalized discharge instructions and has no questions at this time

## 2017-07-14 NOTE — Telephone Encounter (Signed)
Await ER notes.  Agree with ER if difficulty voiding.  Thanks.

## 2017-07-14 NOTE — ED Provider Notes (Signed)
[facility] Emergency Department Provider Note       Time seen: ----------------------------------------- 1:42 PM on 07/14/2017 -----------------------------------------   I have reviewed the triage vital signs and the nursing notes.  HISTORY   Chief Complaint Urinary Retention and Flank Pain    HPI Tad Fancher is a 42 y.o. male with a history of anxiety, diabetes, hyperlipidemia, migraines who presents to the ED for left flank pain.  Patient states he is able to void but feels like he is not urinating enough.  He also reports left-sided low back pain that radiates into his groin.  He has no history of kidney stones.  He was told about a month ago he had blood in his urine but was not having pain at that point.  Pain is 8 out of 10 in his left flank  Past Medical History:  Diagnosis Date  . Anxiety   . Diabetes ([place])   . History of heartburn   . HLD (hyperlipidemia)   . Leg cramps    history of  . Migraines    with photo and phonophobia  . Multiple thyroid nodules   . Plantar fasciitis    L foot, injected at foot center 2014  . Salmonella enteritis 2011   stool culture positive    Patient Active Problem List   Diagnosis Date Noted  . Anxiety 07/26/2016  . Diabetes mellitus without complication ([place]) 08/65/7846  . Paresthesia 03/30/2016  . Advance care planning 08/09/2014  . GERD (gastroesophageal reflux disease) 08/09/2014  . Cough 11/28/2013  . Plantar fasciitis 09/23/2013  . Routine general medical examination at a health care facility 04/01/2011  . Hyperlipidemia 03/28/2010  . Multinodular goiter 03/28/2010    Past Surgical History:  Procedure Laterality Date  . CHOLECYSTECTOMY    . TONSILLECTOMY      Allergies Penicillins  Social History Social History   Tobacco Use  . Smoking status: Never Smoker  . Smokeless tobacco: Never Used  Substance Use Topics  . Alcohol use: No    Alcohol/week: 0.0 oz    Comment:  occassionally  . Drug use: No    Review of Systems Constitutional: Negative for fever. Cardiovascular: Negative for chest pain. Respiratory: Negative for shortness of breath. Gastrointestinal: Positive for flank pain Genitourinary: Positive for urinary hesitancy Musculoskeletal: Negative for back pain. Skin: Negative for rash. Neurological: Negative for headaches, focal weakness or numbness.  All systems negative/normal/unremarkable except as stated in the HPI  ____________________________________________   PHYSICAL EXAM:  VITAL SIGNS: ED Triage Vitals  Enc Vitals Group     BP 07/14/17 1205 (!) 160/90     Pulse Rate 07/14/17 1205 96     Resp 07/14/17 1205 18     Temp 07/14/17 1205 98.7 F (37.1 C)     Temp Source 07/14/17 1205 Oral     SpO2 07/14/17 1205 95 %     Weight 07/14/17 1210 284 lb (128.8 kg)     Height --      Head Circumference --      Peak Flow --      Pain Score 07/14/17 1209 8     Pain Loc --      Pain Edu? --      Excl. in [place]? --    Constitutional: Alert and oriented. Well appearing and in no distress. Eyes: Conjunctivae are normal. Normal extraocular movements. ENT   Head: Normocephalic and atraumatic.   Nose: No congestion/rhinnorhea.   Mouth/Throat: Mucous membranes are moist.  Neck: No stridor. Cardiovascular: Normal rate, regular rhythm. No murmurs, rubs, or gallops. Respiratory: Normal respiratory effort without tachypnea nor retractions. Breath sounds are clear and equal bilaterally. No wheezes/rales/rhonchi. Gastrointestinal: Soft and nontender. Normal bowel sounds Musculoskeletal: Nontender with normal range of motion in extremities. No lower extremity tenderness nor edema. Neurologic:  Normal speech and language. No gross focal neurologic deficits are appreciated.  Skin:  Skin is warm, dry and intact. No rash noted. Psychiatric: Mood and affect are normal. Speech and behavior are normal.   ____________________________________________  ED COURSE:  As part of my medical decision making, I reviewed the following data within the [place] History obtained from family if available, nursing notes, old chart and ekg, as well as notes from prior ED visits. Patient presented for flank pain, we will assess with labs and imaging as indicated at this time.   Procedures ____________________________________________   LABS (pertinent positives/negatives)  Labs Reviewed  URINALYSIS, COMPLETE (UACMP) WITH MICROSCOPIC - Abnormal; Notable for the following components:      Result Value   Color, Urine YELLOW (*)    APPearance CLEAR (*)    Hgb urine dipstick SMALL (*)    All other components within normal limits  CBC WITH DIFFERENTIAL/PLATELET - Abnormal; Notable for the following components:   WBC 11.2 (*)    Neutro Abs 7.8 (*)    All other components within normal limits  COMPREHENSIVE METABOLIC PANEL - Abnormal; Notable for the following components:   Glucose, Bld 100 (*)    All other components within normal limits    RADIOLOGY Images were viewed by me  CT renal protocol IMPRESSION: 1. No acute intra-abdominal process. No renal or ureteral calculi. 2. Hepatic steatosis. ____________________________________________  DIFFERENTIAL DIAGNOSIS   Renal colic, radicular pain, UTI, pyelonephritis, muscle strain, gas pain  FINAL ASSESSMENT AND PLAN  Flank pain   Plan: Patient had presented for flank pain. Patient's labs are unremarkable. Patient's imaging does not reveal any acute intra-abdominal process.  He will be discharged with steroids and pain medication.   Laurence Aly, MD   Note: This note was generated in part or whole with voice recognition software. Voice recognition is usually quite accurate but there are transcription errors that can and very often do occur. I apologize for any typographical errors that were not detected and  corrected.     Earleen Newport, MD 07/14/17 1452

## 2017-07-17 ENCOUNTER — Ambulatory Visit: Payer: 59 | Admitting: Family Medicine

## 2017-07-17 ENCOUNTER — Encounter: Payer: Self-pay | Admitting: Family Medicine

## 2017-07-17 VITALS — BP 136/84 | HR 81 | Temp 98.7°F | Wt 297.5 lb

## 2017-07-17 DIAGNOSIS — R319 Hematuria, unspecified: Secondary | ICD-10-CM | POA: Diagnosis not present

## 2017-07-17 DIAGNOSIS — E119 Type 2 diabetes mellitus without complications: Secondary | ICD-10-CM

## 2017-07-17 DIAGNOSIS — K760 Fatty (change of) liver, not elsewhere classified: Secondary | ICD-10-CM

## 2017-07-17 LAB — URINALYSIS, ROUTINE W REFLEX MICROSCOPIC
BILIRUBIN URINE: NEGATIVE
KETONES UR: NEGATIVE
Leukocytes, UA: NEGATIVE
Nitrite: NEGATIVE
PH: 6.5 (ref 5.0–8.0)
Specific Gravity, Urine: 1.025 (ref 1.000–1.030)
Total Protein, Urine: NEGATIVE
UROBILINOGEN UA: 0.2 (ref 0.0–1.0)
Urine Glucose: NEGATIVE
WBC UA: NONE SEEN (ref 0–?)

## 2017-07-17 MED ORDER — ESCITALOPRAM OXALATE 10 MG PO TABS: 10.0000 mg | ORAL_TABLET | Freq: Every day | ORAL | 3 refills | Status: DC

## 2017-07-17 MED ORDER — OMEPRAZOLE 20 MG PO CPDR: 20.0000 mg | DELAYED_RELEASE_CAPSULE | Freq: Every day | ORAL | 3 refills | Status: DC

## 2017-07-17 NOTE — Progress Notes (Signed)
He was seen at ER recently.  Is still on pred but has stopped oxycodone.  The flank pain is better, his back ROM is back to normal.  He had sig pain and trouble urinating.  Unclear if he passed a stone prior to he CT scan.  No other known h/o stones.    He had work physical with hematuria on the dip about 1 month ago per patient report but not gross hematuria at any point.    D/w pt about CT.  IMPRESSION: 1.  No acute intra-abdominal process.  No renal or ureteral calculi. 2. Hepatic steatosis.  Lipids were better but still diabetic by A1c.  D/w pt about diet and exercise.    Mood is better with lexapro.    Meds, vitals, and allergies reviewed.   ROS: Per HPI unless specifically indicated in ROS section   GEN: nad, alert and oriented HEENT: mucous membranes moist NECK: supple w/o LA CV: rrr. PULM: ctab, no inc wob ABD: soft, +bs, not ttp, no CVA pain EXT: no edema

## 2017-07-17 NOTE — Patient Instructions (Signed)
Try to work on diet and exercise.  We'll contact you with your lab report. Recheck A1c in about 3 months prior to a visit.  If you notice any blood in your urine then let me know.  Take care.  Glad to see you.

## 2017-07-19 DIAGNOSIS — K760 Fatty (change of) liver, not elsewhere classified: Secondary | ICD-10-CM | POA: Insufficient documentation

## 2017-07-19 DIAGNOSIS — R319 Hematuria, unspecified: Secondary | ICD-10-CM | POA: Insufficient documentation

## 2017-07-19 NOTE — Assessment & Plan Note (Signed)
Discussed with patient about labs, diet, exercise, weight loss.  Recheck in a few months.  He agrees.  See after visit summary.

## 2017-07-19 NOTE — Assessment & Plan Note (Signed)
Discussed with patient about imaging, diet, exercise, weight loss.

## 2017-07-19 NOTE — Assessment & Plan Note (Signed)
No flank pain now.  No urinary symptoms.  He has no other history of urinary stones.  It is not clear that he passed one now-he does not recall passing one.Kyle Murphy  He did not have a stone documented on the CT.  At this point still okay for outpatient follow-up.  See notes on follow-up urinalysis today.

## 2017-08-10 ENCOUNTER — Ambulatory Visit (INDEPENDENT_AMBULATORY_CARE_PROVIDER_SITE_OTHER): Payer: 59 | Admitting: Urology

## 2017-08-10 ENCOUNTER — Encounter: Payer: Self-pay | Admitting: Urology

## 2017-08-10 VITALS — BP 133/86 | HR 77 | Ht 68.5 in | Wt 305.4 lb

## 2017-08-10 DIAGNOSIS — R319 Hematuria, unspecified: Secondary | ICD-10-CM | POA: Diagnosis not present

## 2017-08-10 LAB — URINALYSIS, COMPLETE
BILIRUBIN UA: NEGATIVE
Glucose, UA: NEGATIVE
Ketones, UA: NEGATIVE
LEUKOCYTES UA: NEGATIVE
Nitrite, UA: NEGATIVE
PH UA: 5.5 (ref 5.0–7.5)
PROTEIN UA: NEGATIVE
Specific Gravity, UA: 1.025 (ref 1.005–1.030)
Urobilinogen, Ur: 0.2 mg/dL (ref 0.2–1.0)

## 2017-08-10 LAB — MICROSCOPIC EXAMINATION
BACTERIA UA: NONE SEEN
Epithelial Cells (non renal): NONE SEEN /hpf (ref 0–10)
WBC, UA: NONE SEEN /hpf (ref 0–?)

## 2017-08-10 NOTE — Progress Notes (Signed)
08/10/2017 10:20 AM   Kyle Murphy 20-Nov-1975 [id number]  Referring provider: Tonia Ghent, MD [address] [place], [place] [address]  Chief Complaint  Patient presents with  . Hematuria    HPI: The patient consulted for the assessment of microscopic hematuria.  He had a negative CT scan stone protocol and describes left flank discomfort radiating to the left lower quadrant about a month ago.  He has never smoked.  He is a borderline diabetic.  He is getting up 3 or 4 times a night and does not have ankle edema.  He is voiding every 1-2 hours.  His flow is good.  No history of previous GU surgery or kidney stones.  He has no other neurologic issues  Modifying factors: There are no other modifying factors  Associated signs and symptoms: There are no other associated signs and symptoms Aggravating and relieving factors: There are no other aggravating or relieving factors Severity: Moderate Duration: Persistent     PMH: Past Medical History:  Diagnosis Date  . Anxiety   . Diabetes ([place])   . History of heartburn   . HLD (hyperlipidemia)   . Leg cramps    history of  . Migraines    with photo and phonophobia  . Multiple thyroid nodules   . Plantar fasciitis    L foot, injected at foot center 2014  . Salmonella enteritis 2011   stool culture positive    Surgical History: Past Surgical History:  Procedure Laterality Date  . CHOLECYSTECTOMY    . TONSILLECTOMY      Home Medications:  Allergies as of 08/10/2017      Reactions   Penicillins    Has patient had a PCN reaction causing immediate rash, facial/tongue/throat swelling, SOB or lightheadedness with hypotension: No Has patient had a PCN reaction causing severe rash involving mucus membranes or skin necrosis: No Has patient had a PCN reaction that required hospitalization: No Has patient had a PCN reaction occurring within the last 10 years: No If all of the above answers are "NO", then may  proceed with Cephalosporin use.      Medication List        Accurate as of 08/10/17 10:20 AM. Always use your most recent med list.          escitalopram 10 MG tablet Commonly known as:  LEXAPRO Take 1 tablet (10 mg total) by mouth daily.   omeprazole 20 MG capsule Commonly known as:  PRILOSEC Take 1 capsule (20 mg total) by mouth daily.   oxymetazoline 0.05 % nasal spray Commonly known as:  AFRIN Place 1 spray into both nostrils 2 (two) times daily.   predniSONE 10 MG (21) Tbpk tablet Commonly known as:  STERAPRED UNI-PAK 21 TAB Take by mouth daily. Dispense steroid taper pack as directed       Allergies:  Allergies  Allergen Reactions  . Penicillins     Has patient had a PCN reaction causing immediate rash, facial/tongue/throat swelling, SOB or lightheadedness with hypotension: No Has patient had a PCN reaction causing severe rash involving mucus membranes or skin necrosis: No Has patient had a PCN reaction that required hospitalization: No Has patient had a PCN reaction occurring within the last 10 years: No If all of the above answers are "NO", then may proceed with Cephalosporin use.     Family History: Family History  Problem Relation Age of Onset  . Hypertension Mother   . Depression Mother  bipolar  . Diabetes Mother        diet controlled  . COPD Mother   . Thyroid disease Mother   . Diabetes Father   . Thyroid disease Father   . Heart disease Father   . Thyroid disease Brother   . Hypertension Brother   . Diabetes Brother   . Colon cancer Neg Hx   . Prostate cancer Neg Hx     Social History:  reports that  has never smoked. he has never used smokeless tobacco. He reports that he does not drink alcohol or use drugs.  ROS: UROLOGY Frequent Urination?: Yes Hard to postpone urination?: Yes Burning/pain with urination?: Yes Get up at night to urinate?: Yes Leakage of urine?: No Urine stream starts and stops?: No Trouble starting  stream?: No Do you have to strain to urinate?: Yes Blood in urine?: Yes Urinary tract infection?: No Sexually transmitted disease?: No Injury to kidneys or bladder?: No Painful intercourse?: No Weak stream?: No Erection problems?: No Penile pain?: No  Gastrointestinal Nausea?: Yes Vomiting?: Yes Indigestion/heartburn?: Yes Diarrhea?: No Constipation?: No  Constitutional Fever: No Night sweats?: No Weight loss?: No Fatigue?: Yes  Skin Skin rash/lesions?: No Itching?: No  Eyes Blurred vision?: No Double vision?: No  Ears/Nose/Throat Sore throat?: No Sinus problems?: No  Hematologic/Lymphatic Swollen glands?: No Easy bruising?: No  Cardiovascular Leg swelling?: No Chest pain?: No  Respiratory Cough?: No Shortness of breath?: Yes  Endocrine Excessive thirst?: Yes  Musculoskeletal Back pain?: Yes Joint pain?: Yes  Neurological Headaches?: Yes Dizziness?: No  Psychologic Depression?: No Anxiety?: Yes  Physical Exam: BP 133/86 (BP Location: Right Arm, Patient Position: Sitting, Cuff Size: Large)   Pulse 77   Ht 5' 8.5" (1.74 m)   Wt (!) 138.5 kg (305 lb 6.4 oz)   BMI 45.76 kg/m   Constitutional:  Alert and oriented, No acute distress. HEENT: [place] AT, moist mucus membranes.  Trachea midline, no masses. Cardiovascular: No clubbing, cyanosis, or edema. Respiratory: Normal respiratory effort, no increased work of breathing. GI: Abdomen is soft, nontender, nondistended, no abdominal masses GU: No CVA tenderness.  Male genitalia normal and small benign prostate Skin: No rashes, bruises or suspicious lesions. Lymph: No cervical or inguinal adenopathy. Neurologic: Grossly intact, no focal deficits, moving all 4 extremities. Psychiatric: Normal mood and affect.  Laboratory Data: Lab Results  Component Value Date   WBC 11.2 (H) 07/14/2017   HGB 15.0 07/14/2017   HCT 45.0 07/14/2017   MCV 89.9 07/14/2017   PLT 256 07/14/2017    Lab Results    Component Value Date   CREATININE 0.85 07/14/2017      Lab Results  Component Value Date   HGBA1C 6.8 (H) 07/13/2017    Urinalysis    Component Value Date/Time   COLORURINE YELLOW 07/17/2017 0900   APPEARANCEUR CLEAR 07/17/2017 0900   LABSPEC 1.025 07/17/2017 0900   PHURINE 6.5 07/17/2017 0900   GLUCOSEU NEGATIVE 07/17/2017 0900   HGBUR TRACE-INTACT (A) 07/17/2017 0900   BILIRUBINUR NEGATIVE 07/17/2017 0900   BILIRUBINUR Neg 09/23/2013 0848   KETONESUR NEGATIVE 07/17/2017 0900   PROTEINUR NEGATIVE 07/14/2017 1206   UROBILINOGEN 0.2 07/17/2017 0900   NITRITE NEGATIVE 07/17/2017 0900   LEUKOCYTESUR NEGATIVE 07/17/2017 0900    Pertinent Imaging: As above  Assessment & Plan: The patient has microscopic hematuria.  The urinalysis today was negative.  The role of adding contrast to a CT scan and cystoscopy was discussed.  He does have significant nighttime frequency and with  his age I wonder if he should follow-up with his primary care physician to recheck his glucose.  The role of desmopressin may be discussed next time  The more I spoke to the patient he does work night shifts and voids frequently day and night.  His hemoglobin A1c is a little bit worse and I urged him to see his primary care physician.  Myrbetriq or other medications may also be helpful for his 24-hour symptom complex.  He will return with a hematuria CT scan and for cystoscopy.  1. Hematuria, unspecified type 2.  Nighttime frequency - Urinalysis, Complete   No Follow-up on file.  Reece Packer, MD  [facility] [address] [place], [place] [address] [phone]

## 2017-08-11 LAB — BUN+CREAT
BUN / CREAT RATIO: 9 (ref 9–20)
BUN: 7 mg/dL (ref 6–24)
CREATININE: 0.74 mg/dL — AB (ref 0.76–1.27)
GFR calc Af Amer: 132 mL/min/{1.73_m2} (ref >59)
GFR, EST NON AFRICAN AMERICAN: 114 mL/min/{1.73_m2} (ref >59)

## 2017-09-02 ENCOUNTER — Ambulatory Visit
Admission: RE | Admit: 2017-09-02 | Discharge: 2017-09-02 | Disposition: A | Discharge status: Home / Self Care | Payer: 59 | Source: Ambulatory Visit | Attending: Urology | Admitting: Urology

## 2017-09-02 DIAGNOSIS — R319 Hematuria, unspecified: Secondary | ICD-10-CM | POA: Diagnosis present

## 2017-09-02 DIAGNOSIS — K5730 Diverticulosis of large intestine without perforation or abscess without bleeding: Secondary | ICD-10-CM | POA: Diagnosis not present

## 2017-09-02 MED ORDER — IOPAMIDOL (ISOVUE-300) INJECTION 61%: 150.0000 mL | Freq: Once | INTRAVENOUS | Status: DC | PRN

## 2017-09-07 ENCOUNTER — Encounter: Payer: Self-pay | Admitting: Urology

## 2017-09-07 ENCOUNTER — Ambulatory Visit: Payer: 59 | Admitting: Urology

## 2017-09-07 VITALS — BP 152/81 | HR 76 | Ht 68.5 in | Wt 300.5 lb

## 2017-09-07 DIAGNOSIS — R319 Hematuria, unspecified: Secondary | ICD-10-CM | POA: Diagnosis not present

## 2017-09-07 LAB — URINALYSIS, COMPLETE
Bilirubin, UA: NEGATIVE
GLUCOSE, UA: NEGATIVE
Ketones, UA: NEGATIVE
Leukocytes, UA: NEGATIVE
Nitrite, UA: NEGATIVE
PROTEIN UA: NEGATIVE
SPEC GRAV UA: 1.015 (ref 1.005–1.030)
Urobilinogen, Ur: 0.2 mg/dL (ref 0.2–1.0)
pH, UA: 8 — ABNORMAL HIGH (ref 5.0–7.5)

## 2017-09-07 LAB — MICROSCOPIC EXAMINATION
Bacteria, UA: NONE SEEN
Epithelial Cells (non renal): NONE SEEN /hpf (ref 0–10)
WBC, UA: NONE SEEN /hpf (ref 0–5)

## 2017-09-07 MED ORDER — LIDOCAINE HCL 2 % EX GEL
1.0000 "application " | Freq: Once | CUTANEOUS | Status: AC
  Administered 2017-09-07: 1 via URETHRAL

## 2017-09-07 MED ORDER — CIPROFLOXACIN HCL 500 MG PO TABS
500.0000 mg | ORAL_TABLET | Freq: Once | ORAL | Status: AC
  Administered 2017-09-07: 500 mg via ORAL

## 2017-09-07 MED ORDER — MIRABEGRON ER 50 MG PO TB24: 50.0000 mg | ORAL_TABLET | Freq: Every day | ORAL | 11 refills | Status: DC

## 2017-09-07 NOTE — Progress Notes (Signed)
09/07/2017 9:21 AM   Kyle Murphy 1975/11/24 [id number]  Referring provider: Tonia Ghent, MD [address] [place] [address] [place], [place] [address]  Chief Complaint  Patient presents with  . Cysto    HPI: The patient consulted for the assessment of microscopic hematuria.  He had a negative CT scan stone protocol and describes left flank discomfort radiating to the left lower quadrant about a month ago.  He has never smoked.  He is a borderline diabetic.  He is getting up 3 or 4 times a night and does not have ankle edema.  He is voiding every 1-2 hours.  His flow is good.  The patient has microscopic hematuria.  The urinalysis today was negative.  The role of adding contrast to a CT scan and cystoscopy was discussed.  He does have significant nighttime frequency and with his age I wonder if he should follow-up with his primary care physician to recheck his glucose.  The role of desmopressin may be discussed next time  The more I spoke to the patient he does work night shifts and voids frequently day and night.  His hemoglobin A1c is a little bit worse and I urged him to see his primary care physician.  Myrbetriq or other medications may also be helpful for his 24-hour symptom complex.  He will return with a hematuria CT scan and for cystoscopy.  Today Frequency is stable CT scan hematuria workup negative  Frequency and nighttime frequency stable.  After written and verbal consent patient underwent flexible cystoscopy.  Initially the well lubricated scope could not be inserted past the fossa navicularis.  A 14 French catheter was going very nicely.  This was attempted twice.  With his consent I gently dilated him with a 34 [place] and then 49 [place] well lubricated dilator distally only for about an inch.  The scope could then be inserted  The penile bulbar membranous and prostatic urethra were normal.  There was some prominent blood vessels in the prostatic urethra.  He had a little  bit of a high riding bladder neck.  Bladder mucosa and trigone were normal.  There is no cystitis.  There is no carcinoma.  He was nervous but did very well      PMH: Past Medical History:  Diagnosis Date  . Anxiety   . Diabetes ([place])   . History of heartburn   . HLD (hyperlipidemia)   . Leg cramps    history of  . Migraines    with photo and phonophobia  . Multiple thyroid nodules   . Plantar fasciitis    L foot, injected at foot center 2014  . Salmonella enteritis 2011   stool culture positive    Surgical History: Past Surgical History:  Procedure Laterality Date  . CHOLECYSTECTOMY    . TONSILLECTOMY      Home Medications:  Allergies as of 09/07/2017      Reactions   Penicillins    Has patient had a PCN reaction causing immediate rash, facial/tongue/throat swelling, SOB or lightheadedness with hypotension: No Has patient had a PCN reaction causing severe rash involving mucus membranes or skin necrosis: No Has patient had a PCN reaction that required hospitalization: No Has patient had a PCN reaction occurring within the last 10 years: No If all of the above answers are "NO", then may proceed with Cephalosporin use.      Medication List        Accurate as of 09/07/17  9:21 AM. Always  use your most recent med list.          escitalopram 10 MG tablet Commonly known as:  LEXAPRO Take 1 tablet (10 mg total) by mouth daily.   omeprazole 20 MG capsule Commonly known as:  PRILOSEC Take 1 capsule (20 mg total) by mouth daily.   oxymetazoline 0.05 % nasal spray Commonly known as:  AFRIN Place 1 spray into both nostrils 2 (two) times daily.       Allergies:  Allergies  Allergen Reactions  . Penicillins     Has patient had a PCN reaction causing immediate rash, facial/tongue/throat swelling, SOB or lightheadedness with hypotension: No Has patient had a PCN reaction causing severe rash involving mucus membranes or skin necrosis: No Has patient had a PCN reaction  that required hospitalization: No Has patient had a PCN reaction occurring within the last 10 years: No If all of the above answers are "NO", then may proceed with Cephalosporin use.     Family History: Family History  Problem Relation Age of Onset  . Hypertension Mother   . Depression Mother        bipolar  . Diabetes Mother        diet controlled  . COPD Mother   . Thyroid disease Mother   . Diabetes Father   . Thyroid disease Father   . Heart disease Father   . Thyroid disease Brother   . Hypertension Brother   . Diabetes Brother   . Colon cancer Neg Hx   . Prostate cancer Neg Hx     Social History:  reports that he has never smoked. He has never used smokeless tobacco. He reports that he does not drink alcohol or use drugs.  ROS:                                        Physical Exam: BP (!) 152/81 (BP Location: Right Arm, Patient Position: Sitting, Cuff Size: Large)   Pulse 76   Ht 5' 8.5" (1.74 m)   Wt (!) 136.3 kg (300 lb 8 oz)   BMI 45.03 kg/m   Constitutional:  Alert and oriented, No acute distress.   Laboratory Data: Lab Results  Component Value Date   WBC 11.2 (H) 07/14/2017   HGB 15.0 07/14/2017   HCT 45.0 07/14/2017   MCV 89.9 07/14/2017   PLT 256 07/14/2017    Lab Results  Component Value Date   CREATININE 0.74 (L) 08/10/2017    No results found for: PSA  No results found for: TESTOSTERONE  Lab Results  Component Value Date   HGBA1C 6.8 (H) 07/13/2017    Urinalysis    Component Value Date/Time   COLORURINE YELLOW 07/17/2017 0900   APPEARANCEUR Clear 08/10/2017 0930   LABSPEC 1.025 07/17/2017 0900   PHURINE 6.5 07/17/2017 0900   GLUCOSEU Negative 08/10/2017 0930   GLUCOSEU NEGATIVE 07/17/2017 0900   HGBUR TRACE-INTACT (A) 07/17/2017 0900   BILIRUBINUR Negative 08/10/2017 0930   KETONESUR NEGATIVE 07/17/2017 0900   PROTEINUR Negative 08/10/2017 0930   PROTEINUR NEGATIVE 07/14/2017 1206   UROBILINOGEN 0.2  07/17/2017 0900   NITRITE Negative 08/10/2017 0930   NITRITE NEGATIVE 07/17/2017 0900   LEUKOCYTESUR Negative 08/10/2017 0930    Pertinent Imaging: Normal  Assessment & Plan: The patient has been cleared for microscopic hematuria the role of Myrbetriq for daytime and nighttime frequency described.  The patient would  like his frequency to be treated.  I will reassess him on Myrbetriq samples and prescription at 50 mg in about 5 weeks  1. Hematuria, unspecified type 2.  Urinary frequency   - Urinalysis, Complete - ciprofloxacin (CIPRO) tablet 500 mg - lidocaine (XYLOCAINE) 2 % jelly 1 application   Return in about 5 weeks (around 10/12/2017) for follow up.  Reece Packer, MD  [facility] [address], [place], [place] [address] [phone]

## 2017-09-18 ENCOUNTER — Telehealth: Payer: Self-pay

## 2017-09-18 NOTE — Telephone Encounter (Signed)
PA for myrbetriq DENIED!

## 2017-10-05 ENCOUNTER — Telehealth: Payer: Self-pay | Admitting: *Deleted

## 2017-10-05 NOTE — Telephone Encounter (Signed)
Patient requests new Rx:  Patient paying $37 for Escitalopram 10 mg.   Patient requesting lower cost alternative:  Citalopram 20 mg $7 / Sertraline 50 mg $9  /  Paroxetine 20 mg  $17 Please advise.    [facility] [employer]

## 2017-10-06 MED ORDER — CITALOPRAM HYDROBROMIDE 20 MG PO TABS: 20.0000 mg | ORAL_TABLET | Freq: Every day | ORAL | 3 refills | Status: DC

## 2017-10-06 MED ORDER — PANTOPRAZOLE SODIUM 20 MG PO TBEC: 20.0000 mg | DELAYED_RELEASE_TABLET | Freq: Every day | ORAL | 3 refills | Status: DC

## 2017-10-06 NOTE — Telephone Encounter (Signed)
Would change to citalopram 20mg .  rx sent.  Update me if not tolerated.   He'll have to stop prilosec with the citalopram and change to protonix.   rx sent for that too.  Med list updated.  Thanks.

## 2017-10-06 NOTE — Telephone Encounter (Signed)
Prior authorization for Myrbetriq was denied, pt must try Oxybutynin, Tolterodine, or Trospium before Myrbetriq can be approved. Please advise.

## 2017-10-06 NOTE — Telephone Encounter (Signed)
Patient advised.

## 2017-10-07 LAB — HM DIABETES EYE EXAM

## 2017-10-08 ENCOUNTER — Other Ambulatory Visit: Payer: Self-pay | Admitting: Family Medicine

## 2017-10-08 DIAGNOSIS — E119 Type 2 diabetes mellitus without complications: Secondary | ICD-10-CM

## 2017-10-12 ENCOUNTER — Telehealth: Payer: Self-pay | Admitting: Family Medicine

## 2017-10-12 MED ORDER — CITALOPRAM HYDROBROMIDE 20 MG PO TABS: 10.0000 mg | ORAL_TABLET | Freq: Every day | ORAL | Status: DC

## 2017-10-12 MED ORDER — TOLTERODINE TARTRATE ER 4 MG PO CP24: 4.0000 mg | ORAL_CAPSULE | Freq: Every day | ORAL | 3 refills | Status: DC

## 2017-10-12 NOTE — Addendum Note (Signed)
Addended by: Garnette Gunner on: 10/12/2017 11:27 AM   Modules accepted: Orders

## 2017-10-12 NOTE — Telephone Encounter (Signed)
Patient advised and will call in at another time for the appointment as he was driving at the time.

## 2017-10-12 NOTE — Telephone Encounter (Signed)
Copied from [place] #[id number]. Topic: Inquiry >> Oct 12, 2017  9:47 AM Neva Seat wrote: Pt's citalopram (CELEXA) 20 MG tablet was increased at his last visit.  Pt has been sleeping a lot. Please call pt back to discuss asap.

## 2017-10-12 NOTE — Telephone Encounter (Signed)
Detrol LA  4mg 

## 2017-10-12 NOTE — Telephone Encounter (Signed)
I spoke with pt; since starting Celexa 20 mg on 10/06/17 pt has been sleeping a lot; for past few days after taking med pt has slept all day; pt missed work on 10/11/17 due to being asleep; when pt wakes up he states "he feels like a zombie". Pt request cb with what to do. [facility] [place].

## 2017-10-12 NOTE — Telephone Encounter (Signed)
Cut back  1/2 tab a day and get OV set up to discuss.  Thanks.

## 2017-10-15 ENCOUNTER — Other Ambulatory Visit (INDEPENDENT_AMBULATORY_CARE_PROVIDER_SITE_OTHER): Payer: 59

## 2017-10-15 DIAGNOSIS — E119 Type 2 diabetes mellitus without complications: Secondary | ICD-10-CM | POA: Diagnosis not present

## 2017-10-15 LAB — HEMOGLOBIN A1C: Hgb A1c MFr Bld: 6.6 % — ABNORMAL HIGH (ref 4.6–6.5)

## 2017-10-19 ENCOUNTER — Encounter: Payer: Self-pay | Admitting: Urology

## 2017-10-19 ENCOUNTER — Ambulatory Visit: Payer: 59 | Admitting: Urology

## 2017-10-19 VITALS — BP 133/80 | HR 84 | Wt 301.0 lb

## 2017-10-19 DIAGNOSIS — R350 Frequency of micturition: Secondary | ICD-10-CM

## 2017-10-19 LAB — MICROSCOPIC EXAMINATION
BACTERIA UA: NONE SEEN
EPITHELIAL CELLS (NON RENAL): NONE SEEN /HPF (ref 0–10)
WBC, UA: NONE SEEN /hpf (ref 0–5)

## 2017-10-19 LAB — URINALYSIS, COMPLETE
Bilirubin, UA: NEGATIVE
Glucose, UA: NEGATIVE
KETONES UA: NEGATIVE
NITRITE UA: NEGATIVE
PH UA: 7.5 (ref 5.0–7.5)
Protein, UA: NEGATIVE
Specific Gravity, UA: 1.02 (ref 1.005–1.030)
Urobilinogen, Ur: 0.2 mg/dL (ref 0.2–1.0)

## 2017-10-19 NOTE — Progress Notes (Signed)
10/19/2017 9:37 AM   Kyle Murphy 1976-04-04 [id number]  Referring provider: Tonia Ghent, MD [address] [place], [place] [address]  Chief Complaint  Patient presents with  . Urinary Frequency    5 wk    HPI: The patient has microscopic hematuria. The urinalysis today was negative. The role of adding contrast to a CT scan and cystoscopy was discussed. He does have significant nighttime frequency and with his age I wonder if he should follow-up with his primary care physician to recheck his glucose. The role of desmopressin may be discussed next time  The more I spoke to the patient he does work night shifts and voids frequently day and night. His hemoglobin A1c is a little bit worse and I urged him to see his primary care physician. Myrbetriq or other medications may also be helpful for his 24-hour symptom complex.   CT scan and cystoscopy normal.  He was given Myrbetriq for daytime and nighttime frequency.  Today Frequency and nighttime frequency much better and I believe he is on tolterodine.  The Myrbetriq worked but Insurance underwriter did not cover it well.  Clinically not infected      PMH: Past Medical History:  Diagnosis Date  . Anxiety   . Diabetes ([place])   . History of heartburn   . HLD (hyperlipidemia)   . Leg cramps    history of  . Migraines    with photo and phonophobia  . Multiple thyroid nodules   . Plantar fasciitis    L foot, [facility] 2014  . Salmonella enteritis 2011   stool culture positive    Surgical History: Past Surgical History:  Procedure Laterality Date  . CHOLECYSTECTOMY    . TONSILLECTOMY      Home Medications:  Allergies as of 10/19/2017      Reactions   Penicillins    Has patient had a PCN reaction causing immediate rash, facial/tongue/throat swelling, SOB or lightheadedness with hypotension: No Has patient had a PCN reaction causing severe rash involving mucus membranes or skin necrosis: No Has  patient had a PCN reaction that required hospitalization: No Has patient had a PCN reaction occurring within the last 10 years: No If all of the above answers are "NO", then may proceed with Cephalosporin use.      Medication List        Accurate as of 10/19/17  9:37 AM. Always use your most recent med list.          citalopram 20 MG tablet Commonly known as:  CELEXA Take 0.5 tablets (10 mg total) by mouth daily.   escitalopram 10 MG tablet Commonly known as:  LEXAPRO   oxymetazoline 0.05 % nasal spray Commonly known as:  AFRIN Place 1 spray into both nostrils 2 (two) times daily.   pantoprazole 20 MG tablet Commonly known as:  PROTONIX Take 1 tablet (20 mg total) by mouth daily.   tolterodine 4 MG 24 hr capsule Commonly known as:  DETROL LA Take 1 capsule (4 mg total) by mouth daily.       Allergies:  Allergies  Allergen Reactions  . Penicillins     Has patient had a PCN reaction causing immediate rash, facial/tongue/throat swelling, SOB or lightheadedness with hypotension: No Has patient had a PCN reaction causing severe rash involving mucus membranes or skin necrosis: No Has patient had a PCN reaction that required hospitalization: No Has patient had a PCN reaction occurring within the last 10 years:  No If all of the above answers are "NO", then may proceed with Cephalosporin use.     Family History: Family History  Problem Relation Age of Onset  . Hypertension Mother   . Depression Mother        bipolar  . Diabetes Mother        diet controlled  . COPD Mother   . Thyroid disease Mother   . Diabetes Father   . Thyroid disease Father   . Heart disease Father   . Thyroid disease Brother   . Hypertension Brother   . Diabetes Brother   . Colon cancer Neg Hx   . Prostate cancer Neg Hx     Social History:  reports that he has never smoked. He has never used smokeless tobacco. He reports that he does not drink alcohol or use  drugs.  ROS: UROLOGY Frequent Urination?: No Hard to postpone urination?: No Burning/pain with urination?: No Get up at night to urinate?: No Leakage of urine?: No Urine stream starts and stops?: No Trouble starting stream?: No Do you have to strain to urinate?: No Blood in urine?: No Urinary tract infection?: No Sexually transmitted disease?: No Injury to kidneys or bladder?: No Painful intercourse?: No Weak stream?: No Erection problems?: No Penile pain?: No  Gastrointestinal Nausea?: No Vomiting?: No Indigestion/heartburn?: No Diarrhea?: No Constipation?: No  Constitutional Fever: No Night sweats?: No Weight loss?: No Fatigue?: No  Skin Skin rash/lesions?: No Itching?: No  Eyes Blurred vision?: No Double vision?: No  Ears/Nose/Throat Sore throat?: No Sinus problems?: No  Hematologic/Lymphatic Swollen glands?: No Easy bruising?: No  Cardiovascular Leg swelling?: No Chest pain?: No  Respiratory Cough?: No Shortness of breath?: No  Endocrine Excessive thirst?: No  Musculoskeletal Back pain?: No Joint pain?: No  Neurological Headaches?: No Dizziness?: No  Psychologic Depression?: No Anxiety?: No  Physical Exam: BP 133/80   Pulse 84   Wt (!) 301 lb (136.5 kg)   BMI 45.10 kg/m   Constitutional:  Alert and oriented, No acute distress.  Laboratory Data: Lab Results  Component Value Date   WBC 11.2 (H) 07/14/2017   HGB 15.0 07/14/2017   HCT 45.0 07/14/2017   MCV 89.9 07/14/2017   PLT 256 07/14/2017    Lab Results  Component Value Date   CREATININE 0.74 (L) 08/10/2017    No results found for: PSA  No results found for: TESTOSTERONE  Lab Results  Component Value Date   HGBA1C 6.6 (H) 10/15/2017    Urinalysis    Component Value Date/Time   COLORURINE YELLOW 07/17/2017 0900   APPEARANCEUR Clear 09/07/2017 0821   LABSPEC 1.025 07/17/2017 0900   PHURINE 6.5 07/17/2017 0900   GLUCOSEU Negative 09/07/2017 0821    GLUCOSEU NEGATIVE 07/17/2017 0900   HGBUR TRACE-INTACT (A) 07/17/2017 0900   BILIRUBINUR Negative 09/07/2017 0821   KETONESUR NEGATIVE 07/17/2017 0900   PROTEINUR Negative 09/07/2017 0821   PROTEINUR NEGATIVE 07/14/2017 1206   UROBILINOGEN 0.2 07/17/2017 0900   NITRITE Negative 09/07/2017 0821   NITRITE NEGATIVE 07/17/2017 0900   LEUKOCYTESUR Negative 09/07/2017 0821    Pertinent Imaging: None  Assessment & Plan: See in 1 year  1. Urinary frequency 2.  Nighttime frequency - Urinalysis, Complete   No follow-ups on file.  Reece Packer, MD  [facility] [address] [place], [place] [address] [phone]

## 2017-10-22 ENCOUNTER — Ambulatory Visit: Payer: 59 | Admitting: Family Medicine

## 2017-10-22 ENCOUNTER — Encounter: Payer: Self-pay | Admitting: Family Medicine

## 2017-10-22 VITALS — BP 122/78 | HR 74 | Temp 98.5°F | Ht 68.5 in | Wt 295.2 lb

## 2017-10-22 DIAGNOSIS — Z23 Encounter for immunization: Secondary | ICD-10-CM

## 2017-10-22 DIAGNOSIS — F419 Anxiety disorder, unspecified: Secondary | ICD-10-CM

## 2017-10-22 DIAGNOSIS — E119 Type 2 diabetes mellitus without complications: Secondary | ICD-10-CM

## 2017-10-22 NOTE — Progress Notes (Signed)
GERD controlled with PPI. He is improved with that.    LUTS improved with detrol.  No ADE on med.  D/w pt.  I'll defer to urology.   lexparo helped with panic.  Drowsy on 20mg  citalopram.  He missed a shift with sleeping at home.  He cut back to 1/2 tab and his sleep is better.  He is still fatigued at baseline- that isn't changed.    DM2. No meds.  A1c similar to prev.  D/w pt.  Eye exam done a few weeks ago, no retinopathy per patient report- [facility].  He switched back to his old job and that was a good change for him.  He is working on his weight, he was prev up to 305 lbs. D/w pt.    Meds, vitals, and allergies reviewed.   ROS: Per HPI unless specifically indicated in ROS section   GEN: nad, alert and oriented HEENT: mucous membranes moist NECK: supple w/o LA CV: rrr PULM: ctab, no inc wob ABD: soft, +bs EXT: no edema SKIN: well perfused.   Diabetic foot exam: Normal inspection No skin breakdown No calluses  Normal DP pulses Normal sensation to light touch and monofilament Nails normal

## 2017-10-22 NOTE — Patient Instructions (Addendum)
Don't change your meds for now.  Recheck in about 3 months.  The only lab you need to have done for your next diabetic visit is an A1c.  We can do this with a fingerstick test at the office visit.  You do not need a lab visit ahead of time for this.  It does not matter if you are fasting when the lab is done.   Use the eat right diet.  Take care.  Glad to see you.

## 2017-10-25 NOTE — Assessment & Plan Note (Signed)
He cut back to 10 mg on citalopram and his sleep is better.  He still has some fatigue but he is not as drowsy and he is improved as far as his mood is concerned.  Continue as is for now.

## 2017-10-25 NOTE — Assessment & Plan Note (Signed)
He switched back to his old job and that was a good change for him.  He is working on his weight, he was prev up to 305 lbs. D/w pt.  Recent eye exam done per patient report.  Pneumonia vaccine done today.  Eat right diet, low carbohydrate diet, handout given to patient and discussed.  Continue work on weight and recheck in about 3 months. He agrees.

## 2017-12-08 ENCOUNTER — Other Ambulatory Visit: Payer: Self-pay | Admitting: Family Medicine

## 2017-12-08 ENCOUNTER — Encounter: Payer: Self-pay | Admitting: Emergency Medicine

## 2017-12-08 ENCOUNTER — Emergency Department
Admission: EM | Admit: 2017-12-08 | Discharge: 2017-12-08 | Disposition: A | Discharge status: Home / Self Care | Payer: 59 | Attending: Emergency Medicine | Admitting: Emergency Medicine

## 2017-12-08 ENCOUNTER — Other Ambulatory Visit: Payer: Self-pay

## 2017-12-08 DIAGNOSIS — M5432 Sciatica, left side: Secondary | ICD-10-CM | POA: Diagnosis not present

## 2017-12-08 DIAGNOSIS — E119 Type 2 diabetes mellitus without complications: Secondary | ICD-10-CM | POA: Diagnosis not present

## 2017-12-08 DIAGNOSIS — E079 Disorder of thyroid, unspecified: Secondary | ICD-10-CM | POA: Diagnosis not present

## 2017-12-08 DIAGNOSIS — Z79899 Other long term (current) drug therapy: Secondary | ICD-10-CM | POA: Diagnosis not present

## 2017-12-08 DIAGNOSIS — M545 Low back pain: Secondary | ICD-10-CM | POA: Diagnosis present

## 2017-12-08 MED ORDER — METHOCARBAMOL 500 MG PO TABS: 500.0000 mg | ORAL_TABLET | Freq: Three times a day (TID) | ORAL | 0 refills | Status: AC | PRN

## 2017-12-08 MED ORDER — PREDNISONE 50 MG PO TABS: ORAL_TABLET | ORAL | 0 refills | Status: DC

## 2017-12-08 MED ORDER — TOLTERODINE TARTRATE ER 4 MG PO CP24: 4.0000 mg | ORAL_CAPSULE | Freq: Every day | ORAL | 1 refills | Status: DC

## 2017-12-08 NOTE — ED Triage Notes (Signed)
Pt presents to ED via POV with c/o R lower back pain that radiates down the back of his R leg intermittently x 1 month. Pt reports recently doing yard work that made it worse. Pt is ambulatory with some difficulty. Pt states pain worse with standing up.

## 2017-12-08 NOTE — ED Provider Notes (Signed)
[facility] Emergency Department Provider Note  ____________________________________________  Time seen: Approximately 11:23 PM  I have reviewed the triage vital signs and the nursing notes.   HISTORY  Chief Complaint Back Pain    HPI [name] is a 42 y.o. male presents to the emergency department with 8 out of 10 right-sided low back pain with right lower extremity radiculopathy that has occurred for the past month.  Patient reports that pain is worsened over the past several days after doing yard work.  He denies bowel or bladder incontinence as well as saddle anesthesia.  He denies weakness or difficulty with ambulation.  He denies prior surgeries to the low back.  Patient has been taking ibuprofen.   Past Medical History:  Diagnosis Date  . Anxiety   . Diabetes ([place])   . History of heartburn   . HLD (hyperlipidemia)   . Leg cramps    history of  . Migraines    with photo and phonophobia  . Multiple thyroid nodules   . Plantar fasciitis    L foot, injected at foot center 2014  . Salmonella enteritis 2011   stool culture positive    Patient Active Problem List   Diagnosis Date Noted  . Hematuria 07/19/2017  . Fatty liver 07/19/2017  . Anxiety 07/26/2016  . Diabetes mellitus without complication ([place]) 26/83/4196  . Paresthesia 03/30/2016  . Advance care planning 08/09/2014  . GERD (gastroesophageal reflux disease) 08/09/2014  . Cough 11/28/2013  . Plantar fasciitis 09/23/2013  . Routine general medical examination at a health care facility 04/01/2011  . Hyperlipidemia 03/28/2010  . Multinodular goiter 03/28/2010    Past Surgical History:  Procedure Laterality Date  . CHOLECYSTECTOMY    . TONSILLECTOMY      Prior to Admission medications   Medication Sig Start Date End Date Taking? Authorizing Provider  citalopram (CELEXA) 20 MG tablet Take 0.5 tablets (10 mg total) by mouth daily. 10/12/17   Tonia Ghent, MD  methocarbamol  (ROBAXIN) 500 MG tablet Take 1 tablet (500 mg total) by mouth 3 (three) times daily as needed for up to 5 days for muscle spasms. 12/08/17 12/13/17  Lannie Fields, PA-C  pantoprazole (PROTONIX) 20 MG tablet Take 1 tablet (20 mg total) by mouth daily. 10/06/17   Tonia Ghent, MD  predniSONE (DELTASONE) 50 MG tablet Take one 50 mg tablet once daily for the next five days. 12/08/17   Lannie Fields, PA-C  tolterodine (DETROL LA) 4 MG 24 hr capsule Take 1 capsule (4 mg total) by mouth daily. 12/08/17   Bjorn Loser, MD    Allergies Penicillins  Family History  Problem Relation Age of Onset  . Hypertension Mother   . Depression Mother        bipolar  . Diabetes Mother        diet controlled  . COPD Mother   . Thyroid disease Mother   . Diabetes Father   . Thyroid disease Father   . Heart disease Father   . Thyroid disease Brother   . Hypertension Brother   . Diabetes Brother   . Colon cancer Neg Hx   . Prostate cancer Neg Hx     Social History Social History   Tobacco Use  . Smoking status: Never Smoker  . Smokeless tobacco: Never Used  Substance Use Topics  . Alcohol use: No    Alcohol/week: 0.0 oz    Comment: occassionally  . Drug use: No  Review of Systems  Constitutional: No fever/chills Eyes: No visual changes. No discharge ENT: No upper respiratory complaints. Cardiovascular: no chest pain. Respiratory: no cough. No SOB. Gastrointestinal: No abdominal pain.  No nausea, no vomiting.  No diarrhea.  No constipation. Musculoskeletal: Patient has low back pain.  Skin: Negative for rash, abrasions, lacerations, ecchymosis. Neurological: Negative for headaches, focal weakness or numbness.   ____________________________________________   PHYSICAL EXAM:  VITAL SIGNS: ED Triage Vitals [12/08/17 1758]  Enc Vitals Group     BP (!) 147/95     Pulse Rate 81     Resp 18     Temp 98.5 F (36.9 C)     Temp Source Oral     SpO2 96 %     Weight 297 lb (134.7  kg)     Height 5\' 4"  (1.626 m)     Head Circumference      Peak Flow      Pain Score 9     Pain Loc      Pain Edu?      Excl. in [place]?      Constitutional: Alert and oriented. Well appearing and in no acute distress. Eyes: Conjunctivae are normal. PERRL. EOMI. Head: Atraumatic. Cardiovascular: Normal rate, regular rhythm. Normal S1 and S2.  Good peripheral circulation. Respiratory: Normal respiratory effort without tachypnea or retractions. Lungs CTAB. Good air entry to the bases with no decreased or absent breath sounds. Gastrointestinal: Bowel sounds 4 quadrants. Soft and nontender to palpation. No guarding or rigidity. No palpable masses. No distention. No CVA tenderness. Musculoskeletal: Full range of motion to all extremities. No gross deformities appreciated.  Patient has pain with palpation along the paraspinal muscles of the lumbar spine.  Positive straight leg raise, right. Neurologic:  Normal speech and language. No gross focal neurologic deficits are appreciated.  Skin:  Skin is warm, dry and intact. No rash noted. Psychiatric: Mood and affect are normal. Speech and behavior are normal. Patient exhibits appropriate insight and judgement.   ____________________________________________   LABS (all labs ordered are listed, but only abnormal results are displayed)  Labs Reviewed - No data to display ____________________________________________  EKG   ____________________________________________  RADIOLOGY   No results found.  ____________________________________________    PROCEDURES  Procedure(s) performed:    Procedures    Medications - No data to display   ____________________________________________   INITIAL IMPRESSION / ASSESSMENT AND PLAN / ED COURSE  Pertinent labs & imaging results that were available during my care of the patient were reviewed by me and considered in my medical decision making (see chart for details).  Review of the [place]  CSRS was performed in accordance of the [place] prior to dispensing any controlled drugs.      Assessment and plan Low back pain Patient presents to the emergency department with low back pain with right lower extremity radiculopathy.  Patient was discharged with prednisone and Robaxin and advised to follow-up with primary care as needed.  All patient questions were answered.     ____________________________________________  FINAL CLINICAL IMPRESSION(S) / ED DIAGNOSES  Final diagnoses:  Sciatica of left side      NEW MEDICATIONS STARTED DURING THIS VISIT:  ED Discharge Orders        Ordered    predniSONE (DELTASONE) 50 MG tablet     12/08/17 1909    methocarbamol (ROBAXIN) 500 MG tablet  3 times daily PRN     12/08/17 1909  This chart was dictated using voice recognition software/Dragon. Despite best efforts to proofread, errors can occur which can change the meaning. Any change was purely unintentional.    Lannie Fields, PA-C 12/08/17 2326    Orbie Pyo, MD 12/12/17 [phone]

## 2018-01-26 ENCOUNTER — Ambulatory Visit: Payer: 59 | Admitting: Family Medicine

## 2018-01-26 ENCOUNTER — Encounter: Payer: Self-pay | Admitting: Family Medicine

## 2018-01-26 VITALS — BP 132/88 | HR 74 | Temp 98.8°F | Ht 68.5 in | Wt 292.8 lb

## 2018-01-26 DIAGNOSIS — E119 Type 2 diabetes mellitus without complications: Secondary | ICD-10-CM

## 2018-01-26 DIAGNOSIS — R21 Rash and other nonspecific skin eruption: Secondary | ICD-10-CM

## 2018-01-26 LAB — POCT GLYCOSYLATED HEMOGLOBIN (HGB A1C): Hemoglobin A1C: 6.6 % — AB (ref 4.0–5.6)

## 2018-01-26 MED ORDER — TRIAMCINOLONE ACETONIDE 0.1 % EX CREA: 1.0000 "application " | TOPICAL_CREAM | Freq: Two times a day (BID) | CUTANEOUS | 1 refills | Status: DC | PRN

## 2018-01-26 NOTE — Patient Instructions (Addendum)
I would get a flu shot each fall.   Use the triamcinolone cream if the cortisone doesn't help.  You may need to change back to the previous body wash.   Update me as needed.  Recheck in 6 months at a physical, sooner if needed.  Keep working on the eat right diet. Take care.  Glad to see you.

## 2018-01-26 NOTE — Progress Notes (Signed)
Diabetes:  Using medications without difficulties: no Hypoglycemic episodes: no sx Hyperglycemic episodes: no sx Feet problems:no Blood Sugars averaging: not checked.  eye exam within last year: yes A1c done at [facility]. D/w pt.  No change from prev.   dw pt about diet and exercise.  Had given low carb handout prev, discussed again.    Itchy rash on the B shins.  Going on for a few weeks.  Using cortisone as of last night.  Some relief with that.  No trigger other than changing bodywash, unclear if that was the issue.  No FCNAVD.  He doesn't feel sick o/w.    Mood d/w pt.  Doing better on 20mg  citalopram, no ADE.  Less irritable.  Less anxious.    Meds, vitals, and allergies reviewed.   ROS: Per HPI unless specifically indicated in ROS section   GEN: nad, alert and oriented HEENT: mucous membranes moist NECK: supple w/o LA CV: rrr. PULM: ctab, no inc wob ABD: soft, +bs EXT: no edema SKIN: scattered rash with small blanching lesions on the B anterior shins w/o ulceration.

## 2018-01-27 DIAGNOSIS — R21 Rash and other nonspecific skin eruption: Secondary | ICD-10-CM

## 2018-01-27 DIAGNOSIS — L989 Disorder of the skin and subcutaneous tissue, unspecified: Secondary | ICD-10-CM | POA: Insufficient documentation

## 2018-01-27 NOTE — Assessment & Plan Note (Signed)
A1c done at [facility]. D/w pt.  No change from prev.   dw pt about diet and exercise.  Had given low carb handout prev, discussed again.   See avs.

## 2018-01-27 NOTE — Assessment & Plan Note (Signed)
Benign-appearing.  Can use triamcinolone as needed.  Update me as needed.  He agrees.  Discussed with patient about trying to limit triggers, changing soaps, etc.

## 2018-02-26 ENCOUNTER — Encounter: Payer: Self-pay | Admitting: Family Medicine

## 2018-02-26 ENCOUNTER — Ambulatory Visit: Payer: 59 | Admitting: Family Medicine

## 2018-02-26 VITALS — BP 122/82 | HR 72 | Temp 98.6°F | Ht 68.5 in | Wt 287.8 lb

## 2018-02-26 DIAGNOSIS — L918 Other hypertrophic disorders of the skin: Secondary | ICD-10-CM | POA: Diagnosis not present

## 2018-02-26 NOTE — Patient Instructions (Signed)
Keep the areas clean.  Cover if needed.  Wash with soapy water.  Update me as needed.  Take care.  Glad to see you.

## 2018-02-28 DIAGNOSIS — L918 Other hypertrophic disorders of the skin: Secondary | ICD-10-CM | POA: Insufficient documentation

## 2018-02-28 NOTE — Progress Notes (Signed)
Skin tag removal.  Meds, vitals, and allergies reviewed.   Indication: Irritated skin tags  Pt complaints of: Local irritation.  Location: 6 on left axilla, 3 on right axilla, 2 on left side of neck  Size: All are small, less than 1 cm, except for one larger skin tag in the left axilla that is greater than 1 cm.  Informed consent obtained.  Pt aware of risks not limited to but including infection, bleeding, damage to near by organs.  Prep: etoh  Anesthesia: 1%lidocaine with epi, good effect  Lesions snipped with surgical scissors.  No complications.  Tolerated well.  Minimal oozing of blood from some of the lesions easily controlled with application of silver nitrate with adequate hemostasis.  No lesions were sent for pathology as they are all benign-appearing skin tags.  Discussed with patient.

## 2018-02-28 NOTE — Assessment & Plan Note (Signed)
All easily removed. Tolerated well  Routine postprocedure instructions d/w pt-  keep area clean and bandaged, follow up if concerns/spreading erythema/pain.  Update me as needed.  He agrees.

## 2018-04-14 ENCOUNTER — Other Ambulatory Visit: Payer: Self-pay

## 2018-04-14 ENCOUNTER — Emergency Department
Admission: EM | Admit: 2018-04-14 | Discharge: 2018-04-14 | Disposition: A | Discharge status: Home / Self Care | Payer: 59 | Attending: Emergency Medicine | Admitting: Emergency Medicine

## 2018-04-14 ENCOUNTER — Encounter: Payer: Self-pay | Admitting: Emergency Medicine

## 2018-04-14 DIAGNOSIS — T7840XA Allergy, unspecified, initial encounter: Secondary | ICD-10-CM | POA: Insufficient documentation

## 2018-04-14 DIAGNOSIS — Z79899 Other long term (current) drug therapy: Secondary | ICD-10-CM | POA: Diagnosis not present

## 2018-04-14 DIAGNOSIS — E119 Type 2 diabetes mellitus without complications: Secondary | ICD-10-CM | POA: Diagnosis not present

## 2018-04-14 DIAGNOSIS — R21 Rash and other nonspecific skin eruption: Secondary | ICD-10-CM | POA: Diagnosis not present

## 2018-04-14 MED ORDER — PREDNISONE 20 MG PO TABS: 40.0000 mg | ORAL_TABLET | Freq: Every day | ORAL | 0 refills | Status: DC

## 2018-04-14 MED ORDER — METHYLPREDNISOLONE SODIUM SUCC 125 MG IJ SOLR
125.0000 mg | Freq: Once | INTRAMUSCULAR | Status: AC
  Administered 2018-04-14: 125 mg via INTRAVENOUS
  Filled 2018-04-14: qty 2

## 2018-04-14 MED ORDER — DIPHENHYDRAMINE HCL 50 MG/ML IJ SOLN
50.0000 mg | Freq: Once | INTRAMUSCULAR | Status: AC
  Administered 2018-04-14: 50 mg via INTRAVENOUS
  Filled 2018-04-14: qty 1

## 2018-04-14 NOTE — ED Triage Notes (Addendum)
Pt in via POV with allergic reaction to unknown substance.  Pt reports waking up with rash and itching this morning, just getting worse throughout the day, now with lip and right facial swelling.  Pt denies and swelling to tongue and throat.  Pt able to speak in full, clear sentences, no respiratory distress noted at this time.  Vitals WDL.

## 2018-04-14 NOTE — ED Provider Notes (Signed)
[facility] Emergency Department Provider Note  Time seen: 7:21 PM  I have reviewed the triage vital signs and the nursing notes.   HISTORY  Chief Complaint Allergic Reaction    HPI Kyle Murphy is a 42 y.o. male with a past medical history of anxiety, diabetes, hyperlipidemia, presents to the emergency department for hives.  According to the patient patient woke up this afternoon (works third shift) and was itching.  Noticed rash across his abdomen upper extremities and face, denies any swelling to the tongue or throat.  Denies any difficulty speaking or swallowing.  No history of allergic reactions in the past besides penicillin, denies any new medications or new exposures.   Past Medical History:  Diagnosis Date  . Anxiety   . Diabetes ([place])   . History of heartburn   . HLD (hyperlipidemia)   . Leg cramps    history of  . Migraines    with photo and phonophobia  . Multiple thyroid nodules   . Plantar fasciitis    L foot, injected at foot [facility] 2014  . Salmonella enteritis 2011   stool culture positive    Patient Active Problem List   Diagnosis Date Noted  . Skin tag 02/28/2018  . Rash 01/27/2018  . Hematuria 07/19/2017  . Fatty liver 07/19/2017  . Anxiety 07/26/2016  . Diabetes mellitus without complication ([place]) 26/83/4196  . Paresthesia 03/30/2016  . Advance care planning 08/09/2014  . GERD (gastroesophageal reflux disease) 08/09/2014  . Cough 11/28/2013  . Plantar fasciitis 09/23/2013  . Routine general medical examination at a health care facility 04/01/2011  . Hyperlipidemia 03/28/2010  . Multinodular goiter 03/28/2010    Past Surgical History:  Procedure Laterality Date  . CHOLECYSTECTOMY    . TONSILLECTOMY      Prior to Admission medications   Medication Sig Start Date End Date Taking? Authorizing Provider  citalopram (CELEXA) 20 MG tablet Take 0.5 tablets (10 mg total) by mouth daily. 10/12/17   Tonia Ghent, MD   pantoprazole (PROTONIX) 20 MG tablet Take 1 tablet (20 mg total) by mouth daily. 10/06/17   Tonia Ghent, MD  tolterodine (DETROL LA) 4 MG 24 hr capsule Take 1 capsule (4 mg total) by mouth daily. 12/08/17   Bjorn Loser, MD  triamcinolone cream (KENALOG) 0.1 % Apply 1 application topically 2 (two) times daily as needed. 01/26/18   Tonia Ghent, MD    Allergies  Allergen Reactions  . Penicillins     Has patient had a PCN reaction causing immediate rash, facial/tongue/throat swelling, SOB or lightheadedness with hypotension: No Has patient had a PCN reaction causing severe rash involving mucus membranes or skin necrosis: No Has patient had a PCN reaction that required hospitalization: No Has patient had a PCN reaction occurring within the last 10 years: No If all of the above answers are "NO", then may proceed with Cephalosporin use.     Family History  Problem Relation Age of Onset  . Hypertension Mother   . Depression Mother        bipolar  . Diabetes Mother        diet controlled  . COPD Mother   . Thyroid disease Mother   . Diabetes Father   . Thyroid disease Father   . Heart disease Father   . Thyroid disease Brother   . Hypertension Brother   . Diabetes Brother   . Colon cancer Neg Hx   . Prostate cancer Neg Hx  Social History Social History   Tobacco Use  . Smoking status: Never Smoker  . Smokeless tobacco: Never Used  Substance Use Topics  . Alcohol use: No    Alcohol/week: 0.0 standard drinks    Comment: occassionally  . Drug use: No    Review of Systems Constitutional: Negative for fever. Cardiovascular: Negative for chest pain. Respiratory: Negative for shortness of breath. Gastrointestinal: Negative for abdominal pain, vomiting  Musculoskeletal: Negative for musculoskeletal complaints Skin: Patient has hives across his abdomen upper extremities and somewhat on his face. Neurological: Negative for headache All other ROS  negative  ____________________________________________   PHYSICAL EXAM:  VITAL SIGNS: ED Triage Vitals  Enc Vitals Group     BP 04/14/18 1755 130/82     Pulse Rate 04/14/18 1755 93     Resp 04/14/18 1755 16     Temp 04/14/18 1755 99 F (37.2 C)     Temp Source 04/14/18 1755 Oral     SpO2 04/14/18 1755 94 %     Weight 04/14/18 1756 293 lb (132.9 kg)     Height 04/14/18 1756 5\' 9"  (1.753 m)     Head Circumference --      Peak Flow --      Pain Score 04/14/18 1756 0     Pain Loc --      Pain Edu? --      Excl. in [place]? --    Constitutional: Alert and oriented. Well appearing and in no distress. Eyes: Normal exam ENT   Head: Normocephalic and atraumatic.   Mouth/Throat: Mucous membranes are moist. Cardiovascular: Normal rate, regular rhythm. No murmur Respiratory: Normal respiratory effort without tachypnea nor retractions. Breath sounds are clear Gastrointestinal: Soft and nontender. No distention.   Musculoskeletal: Nontender with normal range of motion in all extremities Neurologic:  Normal speech and language. No gross focal neurologic deficits Skin:  Skin is warm, dry.  Diffuse urticaria across the abdomen, sparsely in the upper extremities and face.  Does appear to involve the lips but does not appear to involve intraoral mucosa. Psychiatric: Mood and affect are normal. Speech and behavior are normal.   ____________________________________________   INITIAL IMPRESSION / ASSESSMENT AND PLAN / ED COURSE  Pertinent labs & imaging results that were available during my care of the patient were reviewed by me and considered in my medical decision making (see chart for details).  Patient presents to the emergency department with diffuse hives of her abdomen upper extremities and face most consistent with allergic reaction.  No known exposures new to the patient.  Denies any new foods, detergents, soaps, etc.  No new medications.  We will dose Benadryl, slight Medrol and  continue to closely monitor, patient agreeable plan of care.  Reassuringly there is no wheeze, no trouble breathing, no swelling in the tongue or throat.  Patient's hives have significantly improved, no visible hives currently.  States he is feeling much better.  We will discharge with prednisone.  We will refer to ENT/allergist.  I discussed return precautions. ____________________________________________   FINAL CLINICAL IMPRESSION(S) / ED DIAGNOSES  Allergic reaction    Harvest Dark, MD 04/14/18 2020

## 2018-04-19 ENCOUNTER — Ambulatory Visit (INDEPENDENT_AMBULATORY_CARE_PROVIDER_SITE_OTHER): Payer: 59 | Admitting: Family Medicine

## 2018-04-19 ENCOUNTER — Encounter: Payer: Self-pay | Admitting: Family Medicine

## 2018-04-19 VITALS — BP 116/80 | HR 75 | Temp 98.8°F | Ht 69.0 in | Wt 292.5 lb

## 2018-04-19 DIAGNOSIS — L509 Urticaria, unspecified: Secondary | ICD-10-CM

## 2018-04-19 DIAGNOSIS — R220 Localized swelling, mass and lump, head: Secondary | ICD-10-CM | POA: Diagnosis not present

## 2018-04-19 MED ORDER — EPINEPHRINE 0.3 MG/0.3ML IJ SOAJ: 0.3000 mg | Freq: Once | INTRAMUSCULAR | 0 refills | Status: AC

## 2018-04-19 MED ORDER — LORATADINE 10 MG PO TABS: 10.0000 mg | ORAL_TABLET | Freq: Every day | ORAL | Status: DC | PRN

## 2018-04-19 NOTE — Patient Instructions (Addendum)
We will call about your referral.  Kyle Murphy or Kyle Murphy will call you if you don't see one of them on the way out.   If you have hives (only hives) then take claritin. If you have lip or tongue swelling, take claritin and go to the ER.  If you have to, use the epi pen and dial 911.   Take care.  Glad to see you.

## 2018-04-19 NOTE — Progress Notes (Signed)
He had facial and lip swelling along with hives that came on quickly.  He was at work, started itching, then sx got worse. No new meds, no new triggers.  No new soaps, detergents, foods, etc.  He was seen at ER, treated with antihistamines, steroids.  Improved and discharged.  Still on prednisone now.  No sx of any variety now.  No wheeze, no tongue or lip changes now.  No rash now.  No itching/hives now.    No sx like this prev.  He didn't know if an exposure at work caused the symptoms.   Meds, vitals, and allergies reviewed.   ROS: Per HPI unless specifically indicated in ROS section   GEN: nad, alert and oriented HEENT: mucous membranes moist NECK: supple w/o LA CV: rrr.  PULM: ctab, no inc wob ABD: soft, +bs EXT: no edema SKIN: no acute rash No lip or tongue swelling.  No hives. No wheeze.  No stridor.

## 2018-04-20 ENCOUNTER — Ambulatory Visit: Payer: 59 | Admitting: Allergy & Immunology

## 2018-04-20 ENCOUNTER — Encounter: Payer: Self-pay | Admitting: Allergy & Immunology

## 2018-04-20 VITALS — BP 112/86 | HR 88 | Temp 97.9°F | Resp 18 | Ht 69.0 in | Wt 290.0 lb

## 2018-04-20 DIAGNOSIS — R220 Localized swelling, mass and lump, head: Secondary | ICD-10-CM | POA: Insufficient documentation

## 2018-04-20 DIAGNOSIS — T782XXD Anaphylactic shock, unspecified, subsequent encounter: Secondary | ICD-10-CM | POA: Diagnosis not present

## 2018-04-20 MED ORDER — EPINEPHRINE 0.3 MG/0.3ML IJ SOAJ: 0.3000 mg | Freq: Once | INTRAMUSCULAR | 1 refills | Status: AC

## 2018-04-20 NOTE — Assessment & Plan Note (Addendum)
He has photographs when he was symptomatic and he clearly had lower lip swelling noted.  No symptoms now.  No hives, no rash, no stridor, no wheeze, no lip or tongue changes now.  He still on prednisone.  No clear cause for the symptoms.  He did not know if there was a potential exposure at work that could have caused his symptoms.  I think is reasonable to continue the prednisone for now and have him follow-up with the allergy clinic.  Referred.  He agrees.  Routine cautions given.  EpiPen given.  Discussed routine use.  Discussed emergency room cautions.  See after visit summary.  He agrees. >25 minutes spent in face to face time with patient, >50% spent in counselling or coordination of care.  Discussed avoiding ACE inhibitors and ARB use in the future, should he have hypertension, given his history of lip swelling.  Allergy list updated preemptively.

## 2018-04-20 NOTE — Patient Instructions (Addendum)
1. Anaphylaxis - unknown trigger - I am not sure what caused your allergic reaction, but we could not do testing today since you had a reaction so recently (typically we need to wait one month from the last reaction before doing skin testing). - We will get some labs in the meantime to see if that can point towards a cause. - We will look for a red meat sensitivity with an alpha gal panel. - We will look for other food allergies by looking at a panel of most common food allergies. - We will also look for mast cell disease with a serum tryptase. - We will rule out a stinging insect allergy with a stinging insect panel as well as a fire ant panel. - We will call you in 1 to 2 weeks with the results. - In the meantime, we will send in an Auvi-Q which is an epinephrine autoinjector. - They should call you in 1 to 2 days to confirm shipping address. - Use the AuviQ if needed in the future for life threatening reactions or use cetirizine 20mg  (two tablets) as needed if the symptoms are not severe.   2. Return in about 3 months (around 07/21/2018).   Please inform us of any Emergency Department visits, hospitalizations, or changes in symptoms. Call us before going to the ED for breathing or allergy symptoms since we might be able to fit you in for a sick visit. Feel free to contact us anytime with any questions, problems, or concerns.  It was a pleasure to meet you today!  Websites that have reliable patient information: 1. American Academy of Asthma, Allergy, and Immunology: www.aaaai.org 2. Food Allergy Research and Education (FARE): foodallergy.org 3. Mothers of Asthmatics: http://www.asthmacommunitynetwork.org 4. American College of Allergy, Asthma, and Immunology: MonthlyElectricBill.co.uk   Make sure you are registered to vote! If you have moved or changed any of your contact information, you will need to get this updated before voting!

## 2018-04-20 NOTE — Progress Notes (Signed)
NEW PATIENT  Date of Service/Encounter:  04/20/18  Referring provider: Tonia Ghent, MD   Assessment:   Anaphylaxis - unknown trigger  Plan/Recommendations:   1. Anaphylaxis - unknown trigger - I am not sure what caused your allergic reaction, but we could not do testing today since you had a reaction so recently (typically we need to wait one month from the last reaction before doing skin testing). - We will get some labs in the meantime to see if that can point towards a cause. - We will look for a red meat sensitivity with an alpha gal panel. - We will look for other food allergies by looking at a panel of most common food allergies. - We will also look for mast cell disease with a serum tryptase. - We will rule out a stinging insect allergy with a stinging insect panel as well as a fire ant panel. - We will call you in 1 to 2 weeks with the results. - In the meantime, we will send in an Auvi-Q which is an epinephrine autoinjector. - They should call you in 1 to 2 days to confirm shipping address. - Use the AuviQ if needed in the future for life threatening reactions or use cetirizine 20mg  (two tablets) as needed if the symptoms are not severe.   2. Return in about 3 months (around 07/21/2018).  Subjective:   Kyle Murphy is a 42 y.o. male presenting today for evaluation of  Chief Complaint  Patient presents with  . Rash  . Angioedema    Facial     Kyle Murphy has a history of the following: Patient Active Problem List   Diagnosis Date Noted  . Lip swelling 04/20/2018  . Skin tag 02/28/2018  . Rash 01/27/2018  . Hematuria 07/19/2017  . Fatty liver 07/19/2017  . Anxiety 07/26/2016  . Diabetes mellitus without complication ([place]) 44/08/4740  . Paresthesia 03/30/2016  . Advance care planning 08/09/2014  . GERD (gastroesophageal reflux disease) 08/09/2014  . Cough 11/28/2013  . Plantar fasciitis 09/23/2013  . Routine general medical examination at a  health care facility 04/01/2011  . Hyperlipidemia 03/28/2010  . Multinodular goiter 03/28/2010    History obtained from: chart review and patient.  Kyle Murphy was referred by Tonia Ghent, MD.     Kyle Murphy is a 42 y.o. male presenting for evaluation of an allergic reaction. He was seen in the ED on November 6th for hives and swelling. He works third shift and woke up with itching. He also had hives over the abdominal area as well as his upper extremities and face. He did not have tongue swelling. There were no new medications, but he has reacted to penicillin in the past.  In the ER, he was treated with Benadryl as well as prednisone with immediate resolution in the hives.  He denies permanent skin changes.  He did not have a fever at that time.  He does have a history of joint pain in multiple joints, but this is baseline for him.  He has been told that he needs to lose weight for this to improve.  He is able to tolerate all of the major food allergens without adverse event, at least according to his best knowledge.  He does not really like seafood but has never had a bad reaction to it he does not eat a large amount of red meat, instead having cans of soup and chicken at home.  He will eat the occasional hamburger  at [facility], but he cannot remember the last time that happened.  He did eat [place] food on the afternoon of the episode, but he notes that he started itching when he was still at work around 5 AM.  So he does not think the [place] food is related.  There are 2 dogs in the home, but they have been there for years.  In his current job, he packages containers of mayonnaise into boxes.  It is a dusty environment.  He has worked there for 1 year.  Prior to this, he worked in a [employer], political [employer].  He has no history of allergic rhinitis symptoms.  He has no history of asthma.  He does have a history of penicillin allergy.  When he was very young he had rashes over his entire body.  He  does not remember this event at all, but this is been related through his mother.  He was born in [place] is lived here for the entirety of his life.  Otherwise, there is no history of other atopic diseases, including eczema or urticaria. There is no significant infectious history. Vaccinations are up to date.    Past Medical History: Patient Active Problem List   Diagnosis Date Noted  . Lip swelling 04/20/2018  . Skin tag 02/28/2018  . Rash 01/27/2018  . Hematuria 07/19/2017  . Fatty liver 07/19/2017  . Anxiety 07/26/2016  . Diabetes mellitus without complication ([place]) 29/56/2130  . Paresthesia 03/30/2016  . Advance care planning 08/09/2014  . GERD (gastroesophageal reflux disease) 08/09/2014  . Cough 11/28/2013  . Plantar fasciitis 09/23/2013  . Routine general medical examination at a health care facility 04/01/2011  . Hyperlipidemia 03/28/2010  . Multinodular goiter 03/28/2010    Medication List:  Allergies as of 04/20/2018      Reactions   Ace Inhibitors    H/o lip swelling.    Angiotensin Receptor Blockers    H/o lip swelling.    Penicillins    Has patient had a PCN reaction causing immediate rash, facial/tongue/throat swelling, SOB or lightheadedness with hypotension: No Has patient had a PCN reaction causing severe rash involving mucus membranes or skin necrosis: No Has patient had a PCN reaction that required hospitalization: No Has patient had a PCN reaction occurring within the last 10 years: No If all of the above answers are "NO", then may proceed with Cephalosporin use.      Medication List        Accurate as of 04/20/18  1:23 PM. Always use your most recent med list.          citalopram 20 MG tablet Commonly known as:  CELEXA Take 0.5 tablets (10 mg total) by mouth daily.   EPINEPHrine 0.3 mg/0.3 mL Soaj injection Commonly known as:  EPI-PEN Inject 0.3 mLs (0.3 mg total) into the muscle once for 1 dose.   loratadine 10 MG tablet Commonly  known as:  CLARITIN Take 1 tablet (10 mg total) by mouth daily as needed for allergies.   pantoprazole 20 MG tablet Commonly known as:  PROTONIX Take 1 tablet (20 mg total) by mouth daily.   tolterodine 4 MG 24 hr capsule Commonly known as:  DETROL LA Take 1 capsule (4 mg total) by mouth daily.   triamcinolone cream 0.1 % Commonly known as:  KENALOG Apply 1 application topically 2 (two) times daily as needed.       Birth History: non-contributory  Developmental History: non-contributory.   Past Surgical History: Past Surgical  History:  Procedure Laterality Date  . CHOLECYSTECTOMY    . TONSILLECTOMY       Family History: Family History  Problem Relation Age of Onset  . Hypertension Mother   . Depression Mother        bipolar  . Diabetes Mother        diet controlled  . COPD Mother   . Thyroid disease Mother   . Diabetes Father   . Thyroid disease Father   . Heart disease Father   . Thyroid disease Brother   . Hypertension Brother   . Diabetes Brother   . Colon cancer Neg Hx   . Prostate cancer Neg Hx      Social History: Dyshawn lives at home with his brother.  Their house is approximately 48 years old.  There are rugs in the main living areas.  They have electric heating and window units for cooling.  There are 2 dogs inside of the home.  He does not have dust mite covers on his bedding.  There is no tobacco exposure.  Currently works in a [employer], where he has been since January 2019.    Review of Systems: a 14-point review of systems is pertinent for what is mentioned in HPI.  Otherwise, all other systems were negative. Constitutional: negative other than that listed in the HPI Eyes: negative other than that listed in the HPI Ears, nose, mouth, throat, and face: negative other than that listed in the HPI Respiratory: negative other than that listed in the HPI Cardiovascular: negative other than that listed in the HPI Gastrointestinal: negative  other than that listed in the HPI Genitourinary: negative other than that listed in the HPI Integument: negative other than that listed in the HPI Hematologic: negative other than that listed in the HPI Musculoskeletal: negative other than that listed in the HPI Neurological: negative other than that listed in the HPI Allergy/Immunologic: negative other than that listed in the HPI    Objective:   Blood pressure 112/86, pulse 88, temperature 97.9 F (36.6 C), temperature source Oral, resp. rate 18, height 5\' 9"  (1.753 m), weight 290 lb (131.5 kg), SpO2 93 %. Body mass index is 42.83 kg/m.   Physical Exam:  General: Alert, interactive, in no acute distress. Eyes: No conjunctival injection bilaterally, no discharge on the right, no discharge on the left and no Horner-Trantas dots present. PERRL bilaterally. EOMI without pain. No photophobia.  Ears: Right TM pearly gray with normal light reflex, Left TM pearly gray with normal light reflex, Right TM intact without perforation and Left TM intact without perforation.  Nose/Throat: External nose within normal limits and septum midline. Turbinates edematous without discharge. Posterior oropharynx mildly erythematous without cobblestoning in the posterior oropharynx. Tonsils 2+ without exudates.  Tongue without thrush. Neck: Supple without thyromegaly. Trachea midline. Adenopathy: no enlarged lymph nodes appreciated in the anterior cervical, occipital, axillary, epitrochlear, inguinal, or popliteal regions. Lungs: Clear to auscultation without wheezing, rhonchi or rales. No increased work of breathing. CV: Normal S1/S2. No murmurs. Capillary refill <2 seconds.  Abdomen: Nondistended, nontender. No guarding or rebound tenderness. Bowel sounds present in all fields and hypoactive  Skin: Warm and dry, without lesions or rashes. Extremities:  No clubbing, cyanosis or edema. Neuro:   Grossly intact. No focal deficits appreciated. Responsive to  questions.  Diagnostic studies: deferred due to recent reaction     Salvatore Marvel, MD [facility] [place] [facility]

## 2018-04-23 LAB — ALLERGEN STINGING INSECT PANEL
Hornet, White Face, IgE: <0.1 kU/L
Hornet, Yellow, IgE: <0.1 kU/L

## 2018-04-23 LAB — IGE+ALLERGENS ZONE 2(30)
Amer Sycamore IgE Qn: <0.1 kU/L
Aspergillus Fumigatus IgE: <0.1 kU/L
Bahia Grass IgE: <0.1 kU/L
Bermuda Grass IgE: <0.1 kU/L
Cat Dander IgE: <0.1 kU/L
Cockroach, American IgE: <0.1 kU/L
Common Silver Birch IgE: <0.1 kU/L
D Farinae IgE: 0.62 kU/L — AB
D Pteronyssinus IgE: 1.08 kU/L — AB
Dog Dander IgE: <0.1 kU/L
Elm, American IgE: <0.1 kU/L
IgE (Immunoglobulin E), Serum: 205 IU/mL (ref 6–495)
Mugwort IgE Qn: <0.1 kU/L
Nettle IgE: <0.1 kU/L
Oak, White IgE: <0.1 kU/L
Ragweed, Short IgE: <0.1 kU/L
Sheep Sorrel IgE Qn: <0.1 kU/L
Stemphylium Herbarum IgE: <0.1 kU/L
White Mulberry IgE: 0.2 kU/L — AB

## 2018-04-23 LAB — ALLERGEN PROFILE, BASIC FOOD
Allergen Corn, IgE: <0.1 kU/L
Beef IgE: <0.1 kU/L
Food Mix (Seafoods) IgE: NEGATIVE
Milk IgE: <0.1 kU/L
Peanut IgE: <0.1 kU/L
Pork IgE: <0.1 kU/L

## 2018-04-23 LAB — ALPHA-GAL PANEL
ALPHA GAL IGE: 0.29 kU/L — AB (ref <0.10)
Beef (Bos spp) IgE: <0.1 kU/L (ref <0.35)
Class Interpretation: 0
Class Interpretation: 0
Class Interpretation: 0
Lamb/Mutton (Ovis spp) IgE: <0.1 kU/L (ref <0.35)
Pork (Sus spp) IgE: <0.1 kU/L (ref <0.35)

## 2018-04-23 LAB — TRYPTASE: TRYPTASE: 3.9 ug/L (ref 2.2–13.2)

## 2018-04-23 LAB — ALLERGEN FIRE ANT: I070-IgE Fire Ant (Invicta): <0.1 kU/L

## 2018-06-15 IMAGING — CR DG CHEST 2V
2 series · 2 of 2 positions shown · non-contrast
Comparison: None.

CLINICAL DATA: Cough for 1 week.  History of hyperlipidemia.

EXAM:
CHEST  2 VIEW

[chest pa]
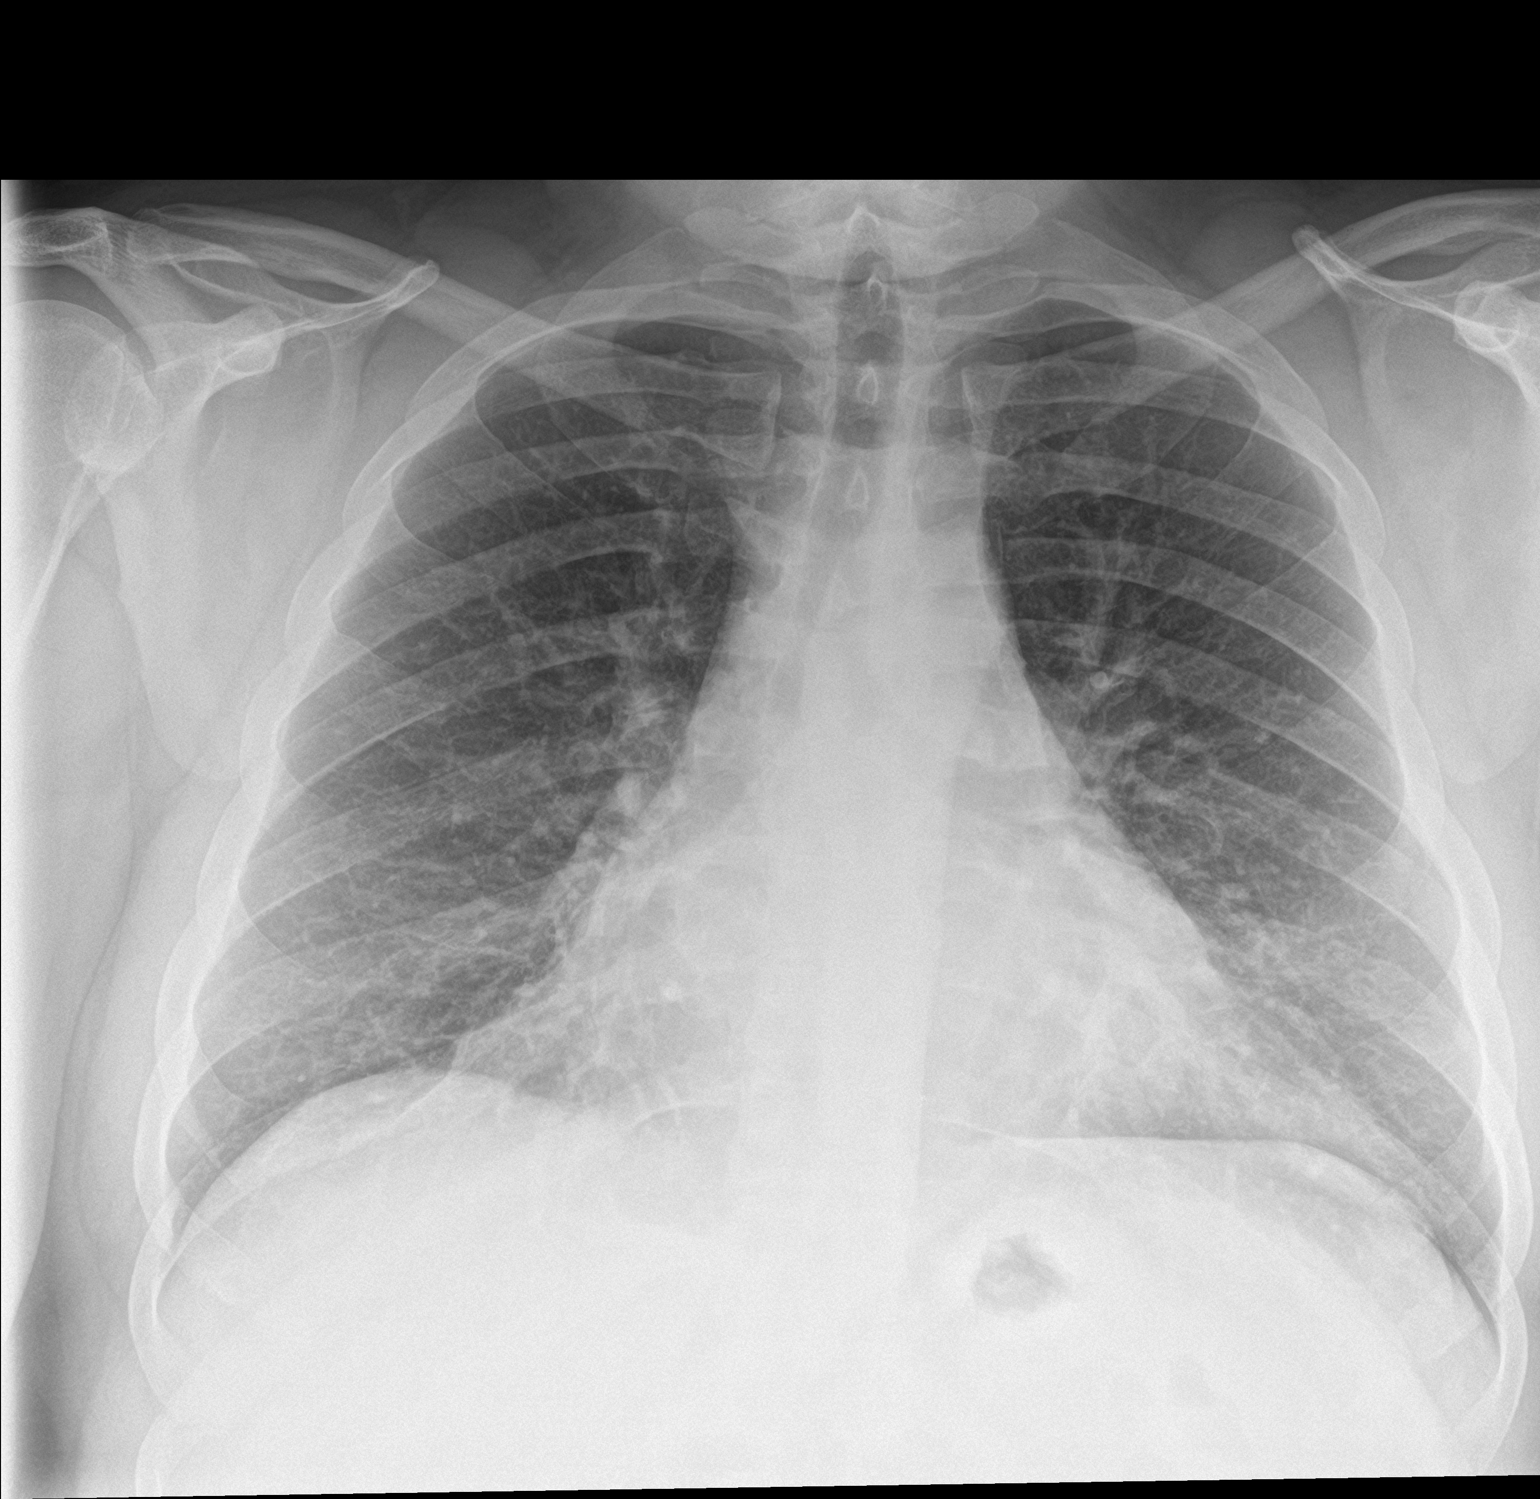

[chest lat]
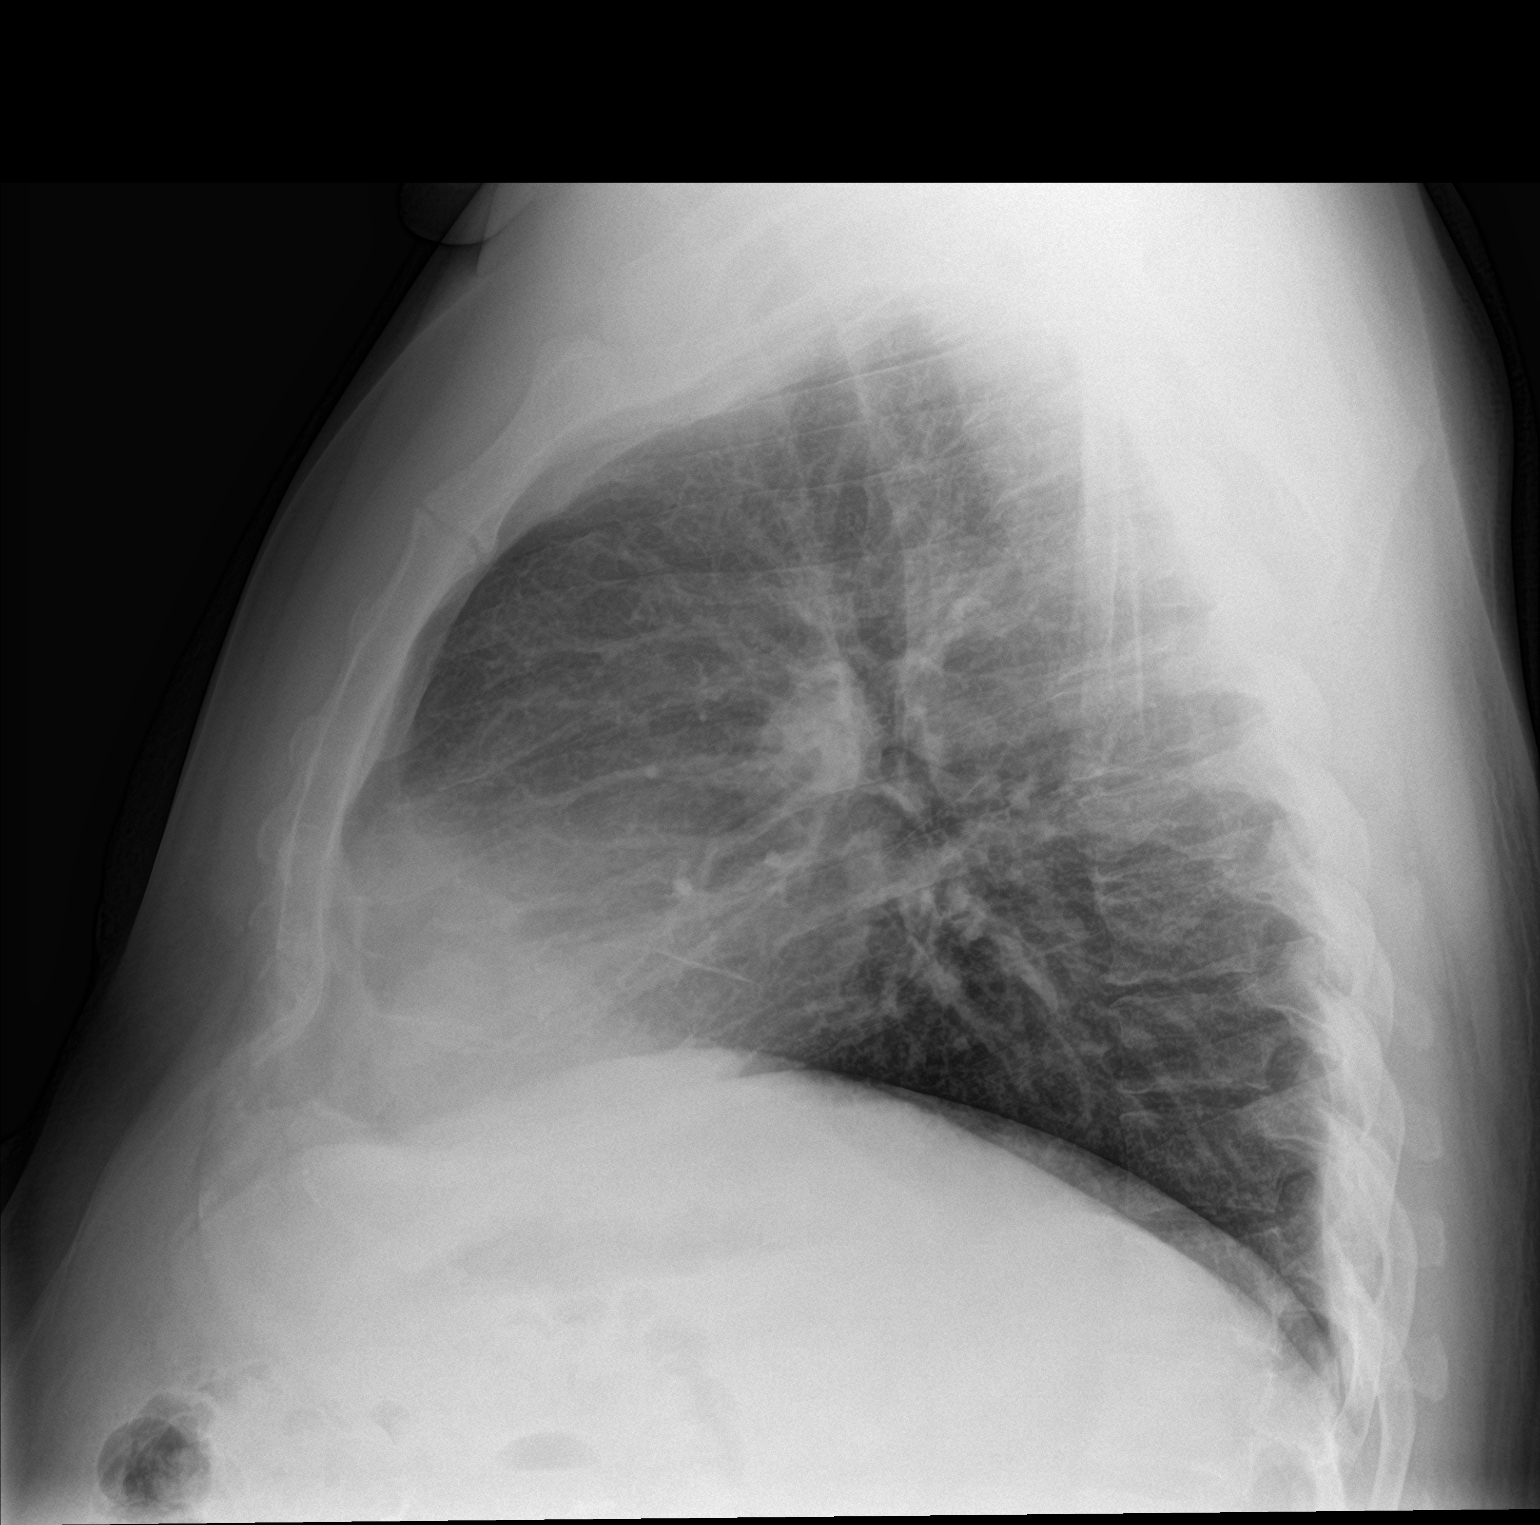

[2 of 2 positions shown; findings below may reference images not displayed]

FINDINGS: Cardiomediastinal silhouette is normal. No pleural effusions or
focal consolidations. Mild bronchitic changes. Trachea projects
midline and there is no pneumothorax. Soft tissue planes and
included osseous structures are non-suspicious.
IMPRESSION: Mild bronchitic changes without focal consolidation.

## 2018-06-25 ENCOUNTER — Other Ambulatory Visit: Payer: Self-pay | Admitting: *Deleted

## 2018-06-25 ENCOUNTER — Telehealth: Payer: Self-pay | Admitting: Family Medicine

## 2018-06-25 MED ORDER — CITALOPRAM HYDROBROMIDE 20 MG PO TABS: 10.0000 mg | ORAL_TABLET | Freq: Every day | ORAL | 3 refills | Status: DC

## 2018-06-25 MED ORDER — PANTOPRAZOLE SODIUM 20 MG PO TBEC: 20.0000 mg | DELAYED_RELEASE_TABLET | Freq: Every day | ORAL | 3 refills | Status: DC

## 2018-06-25 NOTE — Telephone Encounter (Signed)
Refill request has been handled.

## 2018-06-25 NOTE — Telephone Encounter (Signed)
Pt dropped off new rx mail order form. The med list attached the highlighted ones are what he needs refilled.  Best number to call [phone]  He wasn't for sure what he needed to to with form  In dr Damita Dunnings rx tower up front

## 2018-07-20 ENCOUNTER — Ambulatory Visit: Payer: 59 | Admitting: Allergy & Immunology

## 2018-07-20 ENCOUNTER — Encounter: Payer: Self-pay | Admitting: Allergy & Immunology

## 2018-07-20 VITALS — BP 138/86 | HR 81 | Resp 16

## 2018-07-20 DIAGNOSIS — T782XXD Anaphylactic shock, unspecified, subsequent encounter: Secondary | ICD-10-CM | POA: Diagnosis not present

## 2018-07-20 NOTE — Patient Instructions (Addendum)
1. Anaphylaxis - unknown trigger and mildly elevated IgE to alpha gal and dust mite - Maybe this is related to dust mite exposures. - Since you are no longer having the issues and still eating red meat, I would continue to eat whatever you are eating now.  - Use the AuviQ if needed in the future for life threatening reactions or use cetirizine 20mg  (two tablets) as needed if the symptoms are not severe.   2. Return if symptoms worsen or fail to improve.   Please inform us of any Emergency Department visits, hospitalizations, or changes in symptoms. Call us before going to the ED for breathing or allergy symptoms since we might be able to fit you in for a sick visit. Feel free to contact us anytime with any questions, problems, or concerns.  It was a pleasure to see you again today!  Websites that have reliable patient information: 1. American Academy of Asthma, Allergy, and Immunology: www.aaaai.org 2. Food Allergy Research and Education (FARE): foodallergy.org 3. Mothers of Asthmatics: http://www.asthmacommunitynetwork.org 4. American College of Allergy, Asthma, and Immunology: MonthlyElectricBill.co.uk   Make sure you are registered to vote! If you have moved or changed any of your contact information, you will need to get this updated before voting!

## 2018-07-20 NOTE — Progress Notes (Signed)
FOLLOW UP  Date of Service/Encounter:  07/20/18   Assessment:   Anaphylaxis - secondary to dust mites and alpha gal  Plan/Recommendations:   1. Anaphylaxis - unknown trigger and mildly elevated IgE to alpha gal and dust mite - Maybe this is related to dust mite exposures. - Since you are no longer having the issues and still eating red meat, I would continue to eat whatever you are eating now.  - Use the AuviQ if needed in the future for life threatening reactions or use cetirizine 20mg  (two tablets) as needed if the symptoms are not severe.   2. Return if symptoms worsen or fail to improve.  Subjective:   Kyle Murphy is a 43 y.o. male presenting today for follow up of  Chief Complaint  Patient presents with  . Follow-up    Kyle Murphy has a history of the following: Patient Active Problem List   Diagnosis Date Noted  . Lip swelling 04/20/2018  . Skin tag 02/28/2018  . Rash 01/27/2018  . Hematuria 07/19/2017  . Fatty liver 07/19/2017  . Anxiety 07/26/2016  . Diabetes mellitus without complication ([place]) 38/88/2800  . Paresthesia 03/30/2016  . Advance care planning 08/09/2014  . GERD (gastroesophageal reflux disease) 08/09/2014  . Cough 11/28/2013  . Plantar fasciitis 09/23/2013  . Routine general medical examination at a health care facility 04/01/2011  . Hyperlipidemia 03/28/2010  . Multinodular goiter 03/28/2010    History obtained from: chart review and patient.  Gemma Payor Primary Care Provider is Tonia Ghent, MD.     Kyle Murphy is a 43 y.o. male presenting for a follow up visit. He was last seem in November 2019. At that time we did an extensive workup for serious causes of anaphylaxis. The only thing that came back positive was dust mites, white mulberry, as well as alpha gal (0.29). The remainder of the workup was negative. We recommended using cetirizine 20mg  or AuviQ at the onset of symptoms in the future.   Since the last visit, he has  done very well. 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 He has felt much better since he has not gotten back to that station.   Regarding his diet, he has not changed anything at all. He continues to eat a large amount of [employer]. He has not been avoiding beef at all and has not felt any differently. He has not needed his AuvIQ at all and has not needed to take his cetirizine.   Otherwise, there have been no changes to his past medical history, surgical history, family history, or social history.    Review of Systems  Constitutional: Negative.  Negative for fever, malaise/fatigue and weight loss.  HENT: Negative.  Negative for congestion, ear discharge and ear pain.   Eyes: Negative for pain, discharge and redness.  Respiratory: Negative for cough, sputum production, shortness of breath and wheezing.   Cardiovascular: Negative.  Negative for chest pain and palpitations.  Gastrointestinal: Negative for abdominal pain and heartburn.  Skin: Negative.  Negative for itching and rash.  Neurological: Negative for dizziness and headaches.  Endo/Heme/Allergies: Negative for environmental allergies. Does not bruise/bleed easily.       Objective:   Blood pressure 138/86, pulse 81, resp. rate 16, SpO2 99 %. There is no height  or weight on file to calculate BMI.   Physical Exam:  Physical Exam  Constitutional: He appears well-developed.  HENT:  Head: Normocephalic and atraumatic.  Right Ear: Tympanic membrane, external ear and ear canal normal. No drainage, swelling or tenderness. Tympanic membrane is not injected, not scarred, not erythematous, not retracted and not bulging.  Left Ear: Tympanic membrane, external ear and ear canal normal. No drainage, swelling or tenderness. Tympanic membrane is not injected, not scarred,  not erythematous, not retracted and not bulging.  Nose: No mucosal edema, rhinorrhea, nasal deformity or septal deviation. No epistaxis. Right sinus exhibits no maxillary sinus tenderness and no frontal sinus tenderness. Left sinus exhibits no maxillary sinus tenderness and no frontal sinus tenderness.  Mouth/Throat: Uvula is midline and oropharynx is clear and moist. Mucous membranes are not pale and not dry.  Eyes: Pupils are equal, round, and reactive to light. Conjunctivae and EOM are normal. Right eye exhibits no chemosis and no discharge. Left eye exhibits no chemosis and no discharge. Right conjunctiva is not injected. Left conjunctiva is not injected.  Cardiovascular: Normal rate, regular rhythm and normal heart sounds.  Respiratory: Effort normal and breath sounds normal. No accessory muscle usage. No tachypnea. No respiratory distress. He has no wheezes. He has no rhonchi. He has no rales. He exhibits no tenderness.  Lymphadenopathy:       Head (right side): No submandibular, no tonsillar and no occipital adenopathy present.       Head (left side): No submandibular, no tonsillar and no occipital adenopathy present.    He has no cervical adenopathy.  Neurological: He is alert.  Skin: No abrasion, no petechiae and no rash noted. Rash is not papular, not vesicular and not urticarial. No erythema. No pallor.  Psychiatric: He has a normal mood and affect.     Diagnostic studies: none   Salvatore Marvel, MD  [facility]

## 2018-07-23 ENCOUNTER — Ambulatory Visit: Payer: 59 | Admitting: Family Medicine

## 2018-07-23 ENCOUNTER — Encounter: Payer: Self-pay | Admitting: *Deleted

## 2018-07-23 ENCOUNTER — Other Ambulatory Visit: Payer: Self-pay | Admitting: Family Medicine

## 2018-07-23 ENCOUNTER — Other Ambulatory Visit (INDEPENDENT_AMBULATORY_CARE_PROVIDER_SITE_OTHER): Payer: 59

## 2018-07-23 ENCOUNTER — Encounter: Payer: Self-pay | Admitting: Family Medicine

## 2018-07-23 VITALS — BP 118/78 | HR 89 | Temp 98.4°F | Ht 69.0 in | Wt 297.1 lb

## 2018-07-23 DIAGNOSIS — R05 Cough: Secondary | ICD-10-CM

## 2018-07-23 DIAGNOSIS — E119 Type 2 diabetes mellitus without complications: Secondary | ICD-10-CM

## 2018-07-23 DIAGNOSIS — R059 Cough, unspecified: Secondary | ICD-10-CM

## 2018-07-23 LAB — LIPID PANEL
CHOLESTEROL: 167 mg/dL (ref 0–200)
HDL: 30.3 mg/dL — ABNORMAL LOW (ref >39.00)
LDL Cholesterol: 102 mg/dL — ABNORMAL HIGH (ref 0–99)
NONHDL: 136.83
Total CHOL/HDL Ratio: 6
Triglycerides: 175 mg/dL — ABNORMAL HIGH (ref 0.0–149.0)
VLDL: 35 mg/dL (ref 0.0–40.0)

## 2018-07-23 LAB — COMPREHENSIVE METABOLIC PANEL
ALBUMIN: 4 g/dL (ref 3.5–5.2)
ALT: 21 U/L (ref 0–53)
AST: 15 U/L (ref 0–37)
Alkaline Phosphatase: 68 U/L (ref 39–117)
BILIRUBIN TOTAL: 0.3 mg/dL (ref 0.2–1.2)
BUN: 10 mg/dL (ref 6–23)
CO2: 31 mEq/L (ref 19–32)
Calcium: 9.1 mg/dL (ref 8.4–10.5)
Chloride: 102 mEq/L (ref 96–112)
Creatinine, Ser: 0.94 mg/dL (ref 0.40–1.50)
GFR: 87.51 mL/min (ref >60.00)
Glucose, Bld: 114 mg/dL — ABNORMAL HIGH (ref 70–99)
POTASSIUM: 4.3 meq/L (ref 3.5–5.1)
SODIUM: 141 meq/L (ref 135–145)
TOTAL PROTEIN: 7.3 g/dL (ref 6.0–8.3)

## 2018-07-23 LAB — MICROALBUMIN / CREATININE URINE RATIO
CREATININE, U: 155.3 mg/dL
MICROALB UR: 1.1 mg/dL (ref 0.0–1.9)
MICROALB/CREAT RATIO: 0.7 mg/g (ref 0.0–30.0)

## 2018-07-23 LAB — TSH: TSH: 3.27 u[IU]/mL (ref 0.35–4.50)

## 2018-07-23 LAB — HEMOGLOBIN A1C: HEMOGLOBIN A1C: 6.7 % — AB (ref 4.6–6.5)

## 2018-07-23 LAB — POC INFLUENZA A&B (BINAX/QUICKVUE)
INFLUENZA A, POC: NEGATIVE
Influenza B, POC: NEGATIVE

## 2018-07-23 MED ORDER — BENZONATATE 200 MG PO CAPS: 200.0000 mg | ORAL_CAPSULE | Freq: Three times a day (TID) | ORAL | 0 refills | Status: DC | PRN

## 2018-07-23 MED ORDER — HYDROCODONE-HOMATROPINE 5-1.5 MG/5ML PO SYRP: 5.0000 mL | ORAL_SOLUTION | Freq: Three times a day (TID) | ORAL | 0 refills | Status: DC | PRN

## 2018-07-23 NOTE — Progress Notes (Signed)
duration of symptoms: 2-3 days.  Recently out of work.   Rhinorrhea: no Congestion: yes sore throat: yes Cough: yes, post-tussive emesis but dry cough o/w.  Myalgias: no HA for 2 days.  Ear pressure.  No known fevers.    Per HPI unless specifically indicated in ROS section   Meds, vitals, and allergies reviewed.   GEN: nad, alert and oriented HEENT: mucous membranes moist, TM w/o erythema, nasal epithelium injected, OP with cobblestoning NECK: supple w/o LA CV: rrr. PULM: ctab, no inc wob ABD: soft, +bs EXT: no edema Sinuses not ttp.

## 2018-07-23 NOTE — Patient Instructions (Signed)
Flu negative.  Likely another virus.   Rest and fluids.  Use tessalon pills for the cough.  If needed, use hycodan.  Sedation caution.   Out of work for now.  Update me as needed.  Take care.  Glad to see you.

## 2018-07-25 ENCOUNTER — Telehealth: Payer: Self-pay | Admitting: Family Medicine

## 2018-07-25 NOTE — Telephone Encounter (Signed)
Called and LMOVM for patient.  See mychart message.

## 2018-07-25 NOTE — Assessment & Plan Note (Signed)
Flu negative.  Likely another virus.  ddx d/w pt.  Rest and fluids.  Use tessalon pills for the cough.  If needed, use hycodan.  Sedation caution.   Out of work for now.  Update me as needed.  He agrees.  Okay for outpatient f/u.

## 2018-07-26 ENCOUNTER — Encounter: Payer: 59 | Admitting: Family Medicine

## 2018-07-30 ENCOUNTER — Encounter: Payer: Self-pay | Admitting: Family Medicine

## 2018-07-30 ENCOUNTER — Ambulatory Visit (INDEPENDENT_AMBULATORY_CARE_PROVIDER_SITE_OTHER): Payer: 59 | Admitting: Family Medicine

## 2018-07-30 VITALS — BP 106/62 | HR 88 | Temp 98.6°F | Ht 69.0 in | Wt 300.5 lb

## 2018-07-30 DIAGNOSIS — R05 Cough: Secondary | ICD-10-CM

## 2018-07-30 DIAGNOSIS — Z7189 Other specified counseling: Secondary | ICD-10-CM

## 2018-07-30 DIAGNOSIS — T7840XS Allergy, unspecified, sequela: Secondary | ICD-10-CM

## 2018-07-30 DIAGNOSIS — Z0000 Encounter for general adult medical examination without abnormal findings: Secondary | ICD-10-CM | POA: Diagnosis not present

## 2018-07-30 DIAGNOSIS — T7840XA Allergy, unspecified, initial encounter: Secondary | ICD-10-CM | POA: Insufficient documentation

## 2018-07-30 DIAGNOSIS — F419 Anxiety disorder, unspecified: Secondary | ICD-10-CM

## 2018-07-30 DIAGNOSIS — R059 Cough, unspecified: Secondary | ICD-10-CM

## 2018-07-30 DIAGNOSIS — E119 Type 2 diabetes mellitus without complications: Secondary | ICD-10-CM

## 2018-07-30 DIAGNOSIS — R399 Unspecified symptoms and signs involving the genitourinary system: Secondary | ICD-10-CM | POA: Insufficient documentation

## 2018-07-30 MED ORDER — TOLTERODINE TARTRATE ER 4 MG PO CP24: 4.0000 mg | ORAL_CAPSULE | Freq: Every day | ORAL | 3 refills | Status: DC

## 2018-07-30 NOTE — Assessment & Plan Note (Signed)
ctab and he clearly feels better.  No ADE on current meds.  Continue as is.  He agrees.  Update me as needed.

## 2018-07-30 NOTE — Assessment & Plan Note (Signed)
A1c 6.7.  Due for f/u eye exam this year.  No foot sx.  Declined nutrition eval.  TSH wnl, d/w pt.  Defer statin addition at this point.  With weight loss, lipids would likely improve as would DM2 and statin indication from DM2 would not be an issue.  Discussed diet and exercise.

## 2018-07-30 NOTE — Assessment & Plan Note (Signed)
Living will d/w pt. Mother designated if patient were incapacitated.  

## 2018-07-30 NOTE — Assessment & Plan Note (Signed)
Still on citalopram.  Panic is clearly better on med. No ADE on med.  Compliant.  No Si/Hi.  Failed trial off med with inc in panic at that point. Continue as is with current med/dose. He agrees.

## 2018-07-30 NOTE — Assessment & Plan Note (Signed)
LUTs improved with detrol, no ADE on med.

## 2018-07-30 NOTE — Assessment & Plan Note (Signed)
Tetanus 2018 Flu 2019 PNA and shingles not due Colon cancer screening and PSA not due.  Living will d/w pt. Mother designated if patient were incapacitated.  Diet and exercise d/w pt.   Encouraged gradual weight loss.  He is working 3rd shift and that changes his routine, d/w pt.  D/w pt about recent labs.

## 2018-07-30 NOTE — Progress Notes (Signed)
CPE- See plan.  Routine anticipatory guidance given to patient.  See health maintenance.  The possibility exists that previously documented standard health maintenance information may have been brought forward from a previous encounter into this note.  If needed, that same information has been updated to reflect the current situation based on today's encounter.    Tetanus 2018 Flu 2019 PNA and shingles not due Colon cancer screening and PSA not due.  Living will d/w pt. Mother designated if patient were incapacitated.  Diet and exercise d/w pt.   Encouraged gradual weight loss.  He is working 3rd shift and that changes his routine, d/w pt.  D/w pt about recent labs.    He was seen at [facility] 2 days ago, rx'd levaquin, hycodan, and SABA.  CXR w/o PNA per report.  He feels better in the meantime, cough is better.  No fevers now.    Prev allergy sx improved after the machinery change at work.  Eating red meat w/o troubles.  D/w pt.  Prev allergy clinic eval noted.   DM2 by A1c.  A1c 6.7.  Due for f/u eye exam this year.  No foot sx.  Declined nutrition eval.  TSH wnl, d/w pt.   LUTs improved with detrol, no ADE on med.   Mood d/w pt.  Still on citalopram.  Panic is clearly better on med. No ADE on med.  Compliant.  No Si/Hi.  Failed trial off med with inc in panic at that point.   PMH and SH reviewed  Meds, vitals, and allergies reviewed.   ROS: Per HPI.  Unless specifically indicated otherwise in HPI, the patient denies:  General: fever. Eyes: acute vision changes ENT: sore throat Cardiovascular: chest pain Respiratory: SOB GI: vomiting GU: dysuria Musculoskeletal: acute back pain Derm: acute rash Neuro: acute motor dysfunction Psych: worsening mood Endocrine: polydipsia Heme: bleeding Allergy: hayfever  GEN: nad, alert and oriented HEENT: mucous membranes moist, OP wnl NECK: supple w/o LA CV: rrr. PULM: ctab, no inc wob, cough noted but no focal dec in BS ABD: soft,  +bs EXT: no edema SKIN: no acute rash  Diabetic foot exam: Normal inspection No skin breakdown No calluses  Normal DP pulses Normal sensation to light touch and monofilament Nails normal

## 2018-07-30 NOTE — Patient Instructions (Addendum)
Keep working on diet and exercise.  Use the eat right diet.  Recheck in 6 months.   The only lab you need to have done for your next diabetic visit is an A1c.  We can do this with a fingerstick test at the office visit.  You do not need a lab visit ahead of time for this.  It does not matter if you are fasting when the lab is done.    Take care.  Glad to see you.

## 2018-07-30 NOTE — Assessment & Plan Note (Signed)
Prev allergy sx improved after the machinery change at work.  Eating red meat w/o troubles.  D/w pt.  Prev allergy clinic eval noted. I'll defer for now.  He agrees.

## 2018-08-09 ENCOUNTER — Encounter: Payer: Self-pay | Admitting: Family Medicine

## 2018-08-09 ENCOUNTER — Ambulatory Visit: Payer: 59 | Admitting: Family Medicine

## 2018-08-09 VITALS — BP 118/76 | HR 88 | Temp 98.6°F | Resp 18 | Ht 69.0 in | Wt 299.0 lb

## 2018-08-09 DIAGNOSIS — R05 Cough: Secondary | ICD-10-CM

## 2018-08-09 DIAGNOSIS — R059 Cough, unspecified: Secondary | ICD-10-CM

## 2018-08-09 MED ORDER — BENZONATATE 200 MG PO CAPS: 200.0000 mg | ORAL_CAPSULE | Freq: Three times a day (TID) | ORAL | 0 refills | Status: DC | PRN

## 2018-08-09 MED ORDER — HYDROCODONE-HOMATROPINE 5-1.5 MG/5ML PO SYRP: 5.0000 mL | ORAL_SOLUTION | Freq: Three times a day (TID) | ORAL | 0 refills | Status: DC | PRN

## 2018-08-09 NOTE — Patient Instructions (Signed)
Good to see you today  I think your cough is related to your recent lung infection which is cleared as well as from post nasal drainage  I have sent in a refill of two cough medications- a liquid to take when you are going to sleep and a pill to take when working  Please get some cetirizine (over the counter Zyrtec) and take daily for runny nose, watery eyes, sneezing. Take for at least 2 weeks  Use your inhaler every 4 hours as needed for cough, wheeze  If not better in a week, please let us know

## 2018-08-09 NOTE — Progress Notes (Signed)
Subjective:    Patient ID: Kyle Murphy, male    DOB: 12-Oct-1975, 43 y.o.   MRN: [id number]  HPI This is a 43 yo male who presents today with continued cough. Started middle of last month, was treated with levaquin, albuterol inhaler, tessalon. Continues to have dry cough. Post nasal drainage. Taking Robitussin DM and using inhaler a couple of times a day with little relief. No SOB, no wheeze, no fever. Voice hoarse. No headache or ear pain. Feels a lot better other than cough. He had used tessalon and hycodan during acute phase of illness but is not sure if they provided cough relief.   Past Medical History:  Diagnosis Date  . Anxiety   . Diabetes ([place])   . History of heartburn   . HLD (hyperlipidemia)   . Leg cramps    history of  . Migraines    with photo and phonophobia  . Multiple thyroid nodules   . Plantar fasciitis    L foot, injected at foot center 2014  . Salmonella enteritis 2011   stool culture positive   Past Surgical History:  Procedure Laterality Date  . CHOLECYSTECTOMY    . TONSILLECTOMY     Family History  Problem Relation Age of Onset  . Hypertension Mother   . Depression Mother        bipolar  . Diabetes Mother        diet controlled  . COPD Mother   . Thyroid disease Mother   . Diabetes Father   . Thyroid disease Father   . Heart disease Father   . Thyroid disease Brother   . Hypertension Brother   . Diabetes Brother   . Colon cancer Neg Hx   . Prostate cancer Neg Hx    Social History   Tobacco Use  . Smoking status: Never Smoker  . Smokeless tobacco: Never Used  Substance Use Topics  . Alcohol use: No    Alcohol/week: 0.0 standard drinks    Comment: occassionally  . Drug use: No      Review of Systems     Objective:   Physical Exam Vitals signs reviewed.  Constitutional:      General: He is not in acute distress.    Appearance: Normal appearance. He is obese. He is not ill-appearing, toxic-appearing or diaphoretic.  HENT:       Head: Normocephalic and atraumatic.     Nose: Rhinorrhea present.     Mouth/Throat:     Mouth: Mucous membranes are moist.     Pharynx: Oropharynx is clear.     Comments: Voice hoarse.  Eyes:     Conjunctiva/sclera: Conjunctivae normal.  Neck:     Musculoskeletal: Normal range of motion and neck supple. No neck rigidity or muscular tenderness.  Cardiovascular:     Rate and Rhythm: Normal rate and regular rhythm.     Heart sounds: Normal heart sounds.  Pulmonary:     Effort: Pulmonary effort is normal.     Breath sounds: Normal breath sounds.     Comments: Frequent dry cough.  Lymphadenopathy:     Cervical: No cervical adenopathy.  Skin:    General: Skin is warm and dry.  Neurological:     Mental Status: He is alert and oriented to person, place, and time.  Psychiatric:        Mood and Affect: Mood normal.        Behavior: Behavior normal.        Thought Content:  Thought content normal.        Judgment: Judgment normal.       BP 118/76   Pulse 88   Temp 98.6 F (37 C) (Oral)   Resp 18   Ht 5\' 9"  (1.753 m)   Wt 299 lb (135.6 kg)   SpO2 95%   BMI 44.15 kg/m  Wt Readings from Last 3 Encounters:  08/09/18 299 lb (135.6 kg)  07/30/18 (!) 300 lb 8 oz (136.3 kg)  07/23/18 297 lb 2 oz (134.8 kg)       Assessment & Plan:  1. Cough - Provided written and verbal information regarding diagnosis and treatment. -  Patient Instructions  Good to see you today  I think your cough is related to your recent lung infection which is cleared as well as from post nasal drainage  I have sent in a refill of two cough medications- a liquid to take when you are going to sleep and a pill to take when working  Please get some cetirizine (over the counter Zyrtec) and take daily for runny nose, watery eyes, sneezing. Take for at least 2 weeks  Use your inhaler every 4 hours as needed for cough, wheeze  If not better in a week, please let us know     - benzonatate (TESSALON)  200 MG capsule; Take 1 capsule (200 mg total) by mouth 3 (three) times daily as needed.  Dispense: 30 capsule; Refill: 0 - HYDROcodone-homatropine (HYCODAN) 5-1.5 MG/5ML syrup; Take 5 mLs by mouth every 8 (eight) hours as needed for cough (sedation caution).  Dispense: 75 mL; Refill: 0   Clarene Reamer, FNP-BC  [facility]  08/09/2018 12:29 PM

## 2018-09-21 ENCOUNTER — Other Ambulatory Visit: Payer: Self-pay

## 2018-09-21 ENCOUNTER — Emergency Department
Admission: EM | Admit: 2018-09-21 | Discharge: 2018-09-21 | Disposition: A | Discharge status: Home / Self Care | Payer: 59 | Attending: Emergency Medicine | Admitting: Emergency Medicine

## 2018-09-21 ENCOUNTER — Emergency Department: Payer: 59

## 2018-09-21 DIAGNOSIS — Z79899 Other long term (current) drug therapy: Secondary | ICD-10-CM | POA: Insufficient documentation

## 2018-09-21 DIAGNOSIS — M545 Low back pain, unspecified: Secondary | ICD-10-CM

## 2018-09-21 DIAGNOSIS — E119 Type 2 diabetes mellitus without complications: Secondary | ICD-10-CM | POA: Diagnosis not present

## 2018-09-21 MED ORDER — KETOROLAC TROMETHAMINE 30 MG/ML IJ SOLN
30.0000 mg | Freq: Once | INTRAMUSCULAR | Status: AC
  Administered 2018-09-21: 22:00:00 30 mg via INTRAMUSCULAR
  Filled 2018-09-21: qty 1

## 2018-09-21 MED ORDER — METHOCARBAMOL 500 MG PO TABS: 500.0000 mg | ORAL_TABLET | Freq: Three times a day (TID) | ORAL | 0 refills | Status: AC | PRN

## 2018-09-21 MED ORDER — MELOXICAM 15 MG PO TABS: 15.0000 mg | ORAL_TABLET | Freq: Every day | ORAL | 1 refills | Status: AC

## 2018-09-21 MED ORDER — METHOCARBAMOL 500 MG PO TABS
1000.0000 mg | ORAL_TABLET | Freq: Once | ORAL | Status: AC
  Administered 2018-09-21: 1000 mg via ORAL
  Filled 2018-09-21: qty 2

## 2018-09-21 NOTE — ED Triage Notes (Signed)
Pt in with co lower back pain states hx of the same. Hx of sciatica, states yesterday he fell 2 feet and felt pain to back immediately.

## 2018-09-21 NOTE — ED Provider Notes (Signed)
[facility] Emergency Department Provider Note  ____________________________________________  Time seen: Approximately 9:31 PM  I have reviewed the triage vital signs and the nursing notes.   HISTORY  Chief Complaint Back Pain    HPI Kyle Murphy is a 43 y.o. male presents to the emergency department with acute 8 out of 10 right-sided low back pain after patient reports that he stepped down out of his trailer and realized that he had moved his steps.  Patient fell from approximately 2-1/2 feet.  Patient reports he feels like his back is "locked up" since incident occurred.  He denies subjective weakness.  No bowel or bladder incontinence.  Patient has been able to ambulate with some pain and discomfort.  He has a history of chronic back pain which has been well managed until today.  No other alleviating measures have been attempted.   Past Medical History:  Diagnosis Date  . Anxiety   . Diabetes ([place])   . History of heartburn   . HLD (hyperlipidemia)   . Leg cramps    history of  . Migraines    with photo and phonophobia  . Multiple thyroid nodules   . Plantar fasciitis    L foot, injected at foot center 2014  . Salmonella enteritis 2011   stool culture positive    Patient Active Problem List   Diagnosis Date Noted  . Lower urinary tract symptoms (LUTS) 07/30/2018  . Allergy 07/30/2018  . Lip swelling 04/20/2018  . Hematuria 07/19/2017  . Fatty liver 07/19/2017  . Anxiety 07/26/2016  . Diabetes mellitus without complication ([place]) 46/56/8127  . Paresthesia 03/30/2016  . Advance care planning 08/09/2014  . GERD (gastroesophageal reflux disease) 08/09/2014  . Cough 11/28/2013  . Plantar fasciitis 09/23/2013  . Routine general medical examination at a health care facility 04/01/2011  . Hyperlipidemia 03/28/2010  . Multinodular goiter 03/28/2010    Past Surgical History:  Procedure Laterality Date  . CHOLECYSTECTOMY    . TONSILLECTOMY       Prior to Admission medications   Medication Sig Start Date End Date Taking? Authorizing Provider  albuterol (PROVENTIL HFA;VENTOLIN HFA) 108 (90 Base) MCG/ACT inhaler Inhale 1-2 puffs into the lungs every 6 (six) hours as needed for wheezing or shortness of breath.    [provider]  benzonatate (TESSALON) 200 MG capsule Take 1 capsule (200 mg total) by mouth 3 (three) times daily as needed. 08/09/18   Elby Beck, FNP  citalopram (CELEXA) 20 MG tablet Take 0.5 tablets (10 mg total) by mouth daily. 06/25/18   Tonia Ghent, MD  EPINEPHrine (AUVI-Q) 0.3 mg/0.3 mL IJ SOAJ injection Inject 0.3 mg into the muscle as needed for anaphylaxis.    [provider]  HYDROcodone-homatropine (HYCODAN) 5-1.5 MG/5ML syrup Take 5 mLs by mouth every 8 (eight) hours as needed for cough (sedation caution). 08/09/18   Elby Beck, FNP  loratadine (CLARITIN) 10 MG tablet Take 1 tablet (10 mg total) by mouth daily as needed for allergies. 04/19/18   Tonia Ghent, MD  meloxicam (MOBIC) 15 MG tablet Take 1 tablet (15 mg total) by mouth daily for 7 days. 09/21/18 09/28/18  Lannie Fields, PA-C  methocarbamol (ROBAXIN) 500 MG tablet Take 1 tablet (500 mg total) by mouth every 8 (eight) hours as needed for up to 5 days. 09/21/18 09/26/18  Lannie Fields, PA-C  pantoprazole (PROTONIX) 20 MG tablet Take 1 tablet (20 mg total) by mouth daily. 06/25/18   Damita Dunnings,  Elveria Rising, MD  tolterodine (DETROL LA) 4 MG 24 hr capsule Take 1 capsule (4 mg total) by mouth daily. 07/30/18   Tonia Ghent, MD    Allergies Ace inhibitors; Angiotensin receptor blockers; and Penicillins  Family History  Problem Relation Age of Onset  . Hypertension Mother   . Depression Mother        bipolar  . Diabetes Mother        diet controlled  . COPD Mother   . Thyroid disease Mother   . Diabetes Father   . Thyroid disease Father   . Heart disease Father   . Thyroid disease Brother   . Hypertension Brother    . Diabetes Brother   . Colon cancer Neg Hx   . Prostate cancer Neg Hx     Social History Social History   Tobacco Use  . Smoking status: Never Smoker  . Smokeless tobacco: Never Used  Substance Use Topics  . Alcohol use: No    Alcohol/week: 0.0 standard drinks    Comment: occassionally  . Drug use: No     Review of Systems  Constitutional: No fever/chills Eyes: No visual changes. No discharge ENT: No upper respiratory complaints. Cardiovascular: no chest pain. Respiratory: no cough. No SOB. Gastrointestinal: No abdominal pain.  No nausea, no vomiting.  No diarrhea.  No constipation. Musculoskeletal: Patient has low back pain. Skin: Negative for rash, abrasions, lacerations, ecchymosis. Neurological: Negative for headaches, focal weakness or numbness.   ____________________________________________   PHYSICAL EXAM:  VITAL SIGNS: ED Triage Vitals [09/21/18 2049]  Enc Vitals Group     BP 136/86     Pulse Rate 89     Resp 20     Temp 98.9 F (37.2 C)     Temp Source Oral     SpO2 100 %     Weight 295 lb (133.8 kg)     Height      Head Circumference      Peak Flow      Pain Score 8     Pain Loc      Pain Edu?      Excl. in [place]?      Constitutional: Alert and oriented. Well appearing and in no acute distress. Eyes: Conjunctivae are normal. PERRL. EOMI. Head: Atraumatic. Cardiovascular: Normal rate, regular rhythm. Normal S1 and S2.  Good peripheral circulation. Respiratory: Normal respiratory effort without tachypnea or retractions. Lungs CTAB. Good air entry to the bases with no decreased or absent breath sounds. Musculoskeletal: Patient has 5 out of 5 strength in the upper and lower extremities bilaterally and symmetrically.  Patient has paraspinal muscle tenderness along the lumbar spine.  No midline lumbar spine tenderness. Neurologic:  Normal speech and language. No gross focal neurologic deficits are appreciated.  Skin:  Skin is warm, dry and intact. No  rash noted. Psychiatric: Mood and affect are normal. Speech and behavior are normal. Patient exhibits appropriate insight and judgement.   ____________________________________________   LABS (all labs ordered are listed, but only abnormal results are displayed)  Labs Reviewed - No data to display ____________________________________________  EKG   ____________________________________________  RADIOLOGY I personally viewed and evaluated these images as part of my medical decision making, as well as reviewing the written report by the radiologist    Dg Lumbar Spine 2-3 Views  Result Date: 09/21/2018 CLINICAL DATA:  Fall yesterday with low back pain, initial encounter EXAM: LUMBAR SPINE - 3 VIEW COMPARISON:  None. FINDINGS: Five lumbar type  vertebral bodies are well visualized. Vertebral body height is well maintained. No anterolisthesis is noted. Mild disc space narrowing at L4-5 and L5-S1 is noted. IMPRESSION: Mild degenerative change without acute abnormality. Electronically Signed   By: Inez Catalina M.D.   On: 09/21/2018 22:08    ____________________________________________    PROCEDURES  Procedure(s) performed:    Procedures    Medications  ketorolac (TORADOL) 30 MG/ML injection 30 mg (30 mg Intramuscular Given 09/21/18 2136)  methocarbamol (ROBAXIN) tablet 1,000 mg (1,000 mg Oral Given 09/21/18 2136)     ____________________________________________   INITIAL IMPRESSION / ASSESSMENT AND PLAN / ED COURSE  Pertinent labs & imaging results that were available during my care of the patient were reviewed by me and considered in my medical decision making (see chart for details).  Review of the  [place]RS was performed in accordance of the [place] prior to dispensing any controlled drugs.      Assessment and Plan:  Low back pain Patient presents to the emergency department with low back pain on the right side after patient reports that he stepped down from his trailer  and stairs were not present.  X-ray examination of the lumbar spine revealed no acute bony abnormality.  Patient was given Robaxin and Toradol in the emergency department he reported that his pain improved significantly.  He was discharged with meloxicam and Robaxin.  Strict return precautions were given to return for new or worsening symptoms.  All patient questions were answered.  ____________________________________________  FINAL CLINICAL IMPRESSION(S) / ED DIAGNOSES  Final diagnoses:  Acute bilateral low back pain without sciatica      NEW MEDICATIONS STARTED DURING THIS VISIT:  ED Discharge Orders         Ordered    meloxicam (MOBIC) 15 MG tablet  Daily     09/21/18 2227    methocarbamol (ROBAXIN) 500 MG tablet  Every 8 hours PRN     09/21/18 2227              This chart was dictated using voice recognition software/Dragon. Despite best efforts to proofread, errors can occur which can change the meaning. Any change was purely unintentional.    Karren Cobble 09/21/18 2231    Carrie Mew, MD 09/21/18 2300

## 2018-10-18 ENCOUNTER — Ambulatory Visit: Payer: 59 | Admitting: Urology

## 2018-11-02 ENCOUNTER — Encounter: Payer: Self-pay | Admitting: Family Medicine

## 2018-11-04 ENCOUNTER — Telehealth: Payer: Self-pay | Admitting: Family Medicine

## 2018-11-04 ENCOUNTER — Ambulatory Visit: Payer: Self-pay | Admitting: *Deleted

## 2018-11-04 NOTE — Telephone Encounter (Signed)
I think this already went to the [facility] for triage due to sugar elevation- see my chart message.  I wanted to make sure this patient got triaged.  Thanks.

## 2018-11-04 NOTE — Telephone Encounter (Signed)
Pt has appt tomorrow , secured by practice for "Headache."   'Robin' at practice states pt then mentioned BS 420 yesterday. Pt states he checked BS with his brothers monitor.Denies any headache today. Had pt check BS during call; 149.  Pt states BS yesterday at 420 was mid day, had been eating "All those good foods." Checked Tuesday, 360. Pt states he was told he was pre-diabetic. Care advise given per protocol, advised to go to ED if severe headache, rapid breathing,confusion, vomiting occurs. Reviewed diet, pt states "Everyone in my family is diabetic, I know what I should do." Pt verbalizes understanding.   Answer Assessment - Initial Assessment Questions 1. LOCATION: "Where does it hurt?"      Not presently 2. ONSET: "When did the headache start?" (Minutes, hours or days)     HAd and BS was  3. PATTERN: "Does the pain come and go, or has it been constant since it started?"     HAd headache when BS 420 4. SEVERITY: "How bad is the pain?" and "What does it keep you from doing?"  (e.g., Scale 1-10; mild, moderate, or severe)   - MILD (1-3): doesn't interfere with normal activities    - MODERATE (4-7): interferes with normal activities or awakens from sleep    - SEVERE (8-10): excruciating pain, unable to do any normal activities        moderate 5. RECURRENT SYMPTOM: "Have you ever had headaches before?" If so, ask: "When was the last time?" and "What happened that time?"     no 6. CAUSE: "What do you think is causing the headache?"    High BS 7. MIGRAINE: "Have you been diagnosed with migraine headaches?" If so, ask: "Is this headache similar?"       8. HEAD INJURY: "Has there been any recent injury to the head?"      no 9. OTHER SYMPTOMS: "Do you have any other symptoms?" (fever, stiff neck, eye pain, sore throat, cold symptoms)     BS 420, then 360. During call 149  Protocols used: HEADACHE-A-AH

## 2018-11-04 NOTE — Telephone Encounter (Signed)
If no HA now and glucose is better, then keep the OV as scheduled and avoid high carbs foods in the meantime.  Thanks.

## 2018-11-04 NOTE — Telephone Encounter (Signed)
FYI Dr Damita Dunnings I made pt appointment for tomorrow 5/29 When I was doing [phone] questions he stated he wanted to talk to about his blood sugar.  He stated he had a headache and he checked is blood sugar with his brother machine the other night.   His blood sugar was 420.  I sent him to [facility] to be triaged

## 2018-11-04 NOTE — Telephone Encounter (Signed)
Spoken and notified patient of Dr Duncan's comments. Patient verbalized understanding.  

## 2018-11-05 ENCOUNTER — Encounter: Payer: Self-pay | Admitting: Family Medicine

## 2018-11-05 ENCOUNTER — Other Ambulatory Visit: Payer: Self-pay

## 2018-11-05 ENCOUNTER — Ambulatory Visit: Payer: 59 | Admitting: Family Medicine

## 2018-11-05 VITALS — BP 138/84 | HR 83 | Temp 98.0°F | Wt 304.4 lb

## 2018-11-05 DIAGNOSIS — L989 Disorder of the skin and subcutaneous tissue, unspecified: Secondary | ICD-10-CM

## 2018-11-05 DIAGNOSIS — M5431 Sciatica, right side: Secondary | ICD-10-CM | POA: Diagnosis not present

## 2018-11-05 DIAGNOSIS — E119 Type 2 diabetes mellitus without complications: Secondary | ICD-10-CM | POA: Diagnosis not present

## 2018-11-05 LAB — POCT GLYCOSYLATED HEMOGLOBIN (HGB A1C): Hemoglobin A1C: 7.8 % — AB (ref 4.0–5.6)

## 2018-11-05 MED ORDER — GABAPENTIN 100 MG PO CAPS: 100.0000 mg | ORAL_CAPSULE | Freq: Three times a day (TID) | ORAL | 1 refills | Status: DC | PRN

## 2018-11-05 MED ORDER — METFORMIN HCL 500 MG PO TABS: 500.0000 mg | ORAL_TABLET | Freq: Two times a day (BID) | ORAL | 3 refills | Status: DC

## 2018-11-05 NOTE — Progress Notes (Signed)
Diabetes:  No meds.  Hypoglycemic episodes: yes, see prev notes, he had HA with prev sugar elevation that resolved with getting sugar lower. Hyperglycemic episodes: no Feet problems: no Blood Sugars averaging: 115-420 eye exam within last year: due, d/w pt.   He was on a split shift but then moved back to 3rd shift.   Due for A1c.  Done at office visit, discussed with patient.  See plan.  Leg pain.  R leg pain.  Radicular.  More pain after standing.  Going on for greater than 6 weeks.  Prev imaging done, reviewed, discussed with patient. No B/B sx.  In the past was treated with steroid and muscle relaxer, in the distant past.   PMH and SH reviewed  ROS: Per HPI unless specifically indicated in ROS section   Meds, vitals, and allergies reviewed.   GEN: nad, alert and oriented HEENT: mucous membranes moist NECK: supple w/o LA CV: rrr. PULM: ctab, no inc wob ABD: soft, +bs EXT: no edema SKIN: flesh colored, slightly darker macular lesion- possible ringworm- on medial R ankle, present for about 1-2 months.  Not itchy.   SLR neg at office visit.  Strength and sensation grossly intact for the bilateral lower extremities.  Able to bear weight. Back not tender to palpation midline.  Diabetic foot exam: Normal inspection No skin breakdown No calluses  Normal DP pulses Normal sensation to light touch and monofilament Nails normal

## 2018-11-05 NOTE — Patient Instructions (Addendum)
We'll call you about getting the MRI set up.  Out of work for now.  Use the gabapentin if needed for pain.  Sedation caution.   Recheck A1c in 3 months before or at a visit.  Start taking metformin 500mg  daily.  See which strips are cheaper/covered and we can send in that meter.   Take care.  Glad to see you.

## 2018-11-07 ENCOUNTER — Encounter: Payer: Self-pay | Admitting: Family Medicine

## 2018-11-07 DIAGNOSIS — M5431 Sciatica, right side: Secondary | ICD-10-CM | POA: Insufficient documentation

## 2018-11-07 NOTE — Assessment & Plan Note (Signed)
Greater than 6 weeks of symptoms. Out of work for now.  Use the gabapentin if needed for pain.  Sedation caution.  Rationale for use discussed with patient. Set up MRI. Await follow-up imaging. Routine cautions given to patient.  He agrees.

## 2018-11-07 NOTE — Assessment & Plan Note (Signed)
Looks like a benign fungal lesion and he can use a topical antifungal and update me as needed.

## 2018-11-07 NOTE — Assessment & Plan Note (Signed)
Worse control recently.   Diet discussed with patient.  Labs discussed with patient. Recheck A1c in 3 months before or at a visit.  Start taking metformin 500mg  daily.  He will see which strips are cheaper/covered and we can send in that meter.  He will update me as needed about that.

## 2018-11-11 ENCOUNTER — Encounter: Payer: Self-pay | Admitting: Family Medicine

## 2018-11-14 ENCOUNTER — Other Ambulatory Visit: Payer: Self-pay | Admitting: Family Medicine

## 2018-11-14 MED ORDER — DIAZEPAM 5 MG PO TABS: 2.5000 mg | ORAL_TABLET | Freq: Once | ORAL | 0 refills | Status: DC

## 2018-11-18 ENCOUNTER — Ambulatory Visit
Admission: RE | Admit: 2018-11-18 | Discharge: 2018-11-18 | Disposition: A | Discharge status: Home / Self Care | Payer: 59 | Source: Ambulatory Visit | Attending: Family Medicine | Admitting: Family Medicine

## 2018-11-18 DIAGNOSIS — M5431 Sciatica, right side: Secondary | ICD-10-CM

## 2018-11-20 ENCOUNTER — Other Ambulatory Visit: Payer: Self-pay

## 2018-11-20 ENCOUNTER — Emergency Department
Admission: EM | Admit: 2018-11-20 | Discharge: 2018-11-20 | Disposition: A | Discharge status: Home / Self Care | Payer: 59 | Attending: Emergency Medicine | Admitting: Emergency Medicine

## 2018-11-20 ENCOUNTER — Encounter: Payer: Self-pay | Admitting: Emergency Medicine

## 2018-11-20 DIAGNOSIS — E119 Type 2 diabetes mellitus without complications: Secondary | ICD-10-CM | POA: Diagnosis not present

## 2018-11-20 DIAGNOSIS — M545 Low back pain, unspecified: Secondary | ICD-10-CM

## 2018-11-20 DIAGNOSIS — G8929 Other chronic pain: Secondary | ICD-10-CM

## 2018-11-20 DIAGNOSIS — Z79899 Other long term (current) drug therapy: Secondary | ICD-10-CM | POA: Diagnosis not present

## 2018-11-20 DIAGNOSIS — Z7984 Long term (current) use of oral hypoglycemic drugs: Secondary | ICD-10-CM | POA: Insufficient documentation

## 2018-11-20 MED ORDER — PREDNISONE 10 MG PO TABS: ORAL_TABLET | ORAL | 0 refills | Status: DC

## 2018-11-20 MED ORDER — CYCLOBENZAPRINE HCL 5 MG PO TABS: 5.0000 mg | ORAL_TABLET | Freq: Three times a day (TID) | ORAL | 0 refills | Status: DC | PRN

## 2018-11-20 MED ORDER — LIDOCAINE 5 % EX PTCH
1.0000 | MEDICATED_PATCH | CUTANEOUS | Status: DC
  Administered 2018-11-20: 1 via TRANSDERMAL
  Filled 2018-11-20: qty 1

## 2018-11-20 MED ORDER — PREDNISONE 20 MG PO TABS
60.0000 mg | ORAL_TABLET | ORAL | Status: AC
  Administered 2018-11-20: 60 mg via ORAL
  Filled 2018-11-20: qty 3

## 2018-11-20 MED ORDER — LIDOCAINE 5 % EX PTCH: 1.0000 | MEDICATED_PATCH | Freq: Two times a day (BID) | CUTANEOUS | 0 refills | Status: DC

## 2018-11-20 MED ORDER — OXYCODONE-ACETAMINOPHEN 5-325 MG PO TABS
2.0000 | ORAL_TABLET | Freq: Once | ORAL | Status: AC
  Administered 2018-11-20: 2 via ORAL
  Filled 2018-11-20: qty 2

## 2018-11-20 NOTE — Discharge Instructions (Signed)
As we discussed, the MRI you had as an outpatient shows some disc disease where it is bulging in some places and narrowing and others.  This certainly can cause the kind of pain you have been experiencing for an extended period of time.  Please take the prescribed prednisone taper but be aware that it will cause your blood glucose to go up, sometimes substantially.  Continue taking your metformin as prescribed.  Use the Flexeril as needed and Lidoderm patches as needed.  You may continue to take over-the-counter Tylenol as well (up to 1 g every 6 hours).    I gave you follow-up information with a [facility] neurosurgeon who sees patients and has a clinic at [facility].  Please call the number provided on Monday to schedule a follow-up appointment; explain you were seen in the [facility] emergency department and referred to Dr. Lacinda Axon or 1 of his colleagues for neurosurgical follow-up for your back problems.  Return to the ED for worsening back pain, fever, weakness or numbness of either leg, or if you develop either (1) an inability to urinate or have bowel movements, or (2) loss of your ability to control your bathroom functions (if you start having "accidents"), or if you develop other new symptoms that concern you.

## 2018-11-20 NOTE — ED Provider Notes (Signed)
[facility] Emergency Department Provider Note  ____________________________________________   First MD Initiated Contact with Patient 11/20/18 [phone]     (approximate)  I have reviewed the triage vital signs and the nursing notes.   HISTORY  Chief Complaint Back Pain    HPI Erica Osuna is a 43 y.o. male with medical history as listed below who presents for evaluation of about a month and a half of the low back pain which seems to be getting worse.  He reports that he is seen his primary care doctor about this and in fact had a lumbar spine MRI performed 2 days ago but he does not have the results.  He said that he was doing okay but then went for a short drive and afterwards his back pain was so bad that he could not work.  He is having difficulty with ambulation.  He is not having any difficulty with urination or bowel movements.  Sometimes the pain radiates down both of his legs and sometimes it does not.  He is not having any urinary incontinence.  He denies fever, sore throat, chest pain, shortness of breath, cough, nausea, vomiting, and abdominal pain.  He has not seen a back specialist.  He has a diabetic and takes metformin.  He said that in the past, prednisone and a muscle relaxer have helped.  He rates the pain is severe and moving around makes it worse, nothing in particular makes it better.         Past Medical History:  Diagnosis Date   Anxiety    Diabetes ([place])    History of heartburn    HLD (hyperlipidemia)    Leg cramps    history of   Migraines    with photo and phonophobia   Multiple thyroid nodules    Plantar fasciitis    L foot, injected at foot center 2014   Salmonella enteritis 2011   stool culture positive    Patient Active Problem List   Diagnosis Date Noted   Sciatica of right side 11/07/2018   Lower urinary tract symptoms (LUTS) 07/30/2018   Allergy 07/30/2018   Lip swelling 04/20/2018   Skin lesion  01/27/2018   Hematuria 07/19/2017   Fatty liver 07/19/2017   Anxiety 07/26/2016   Diabetes mellitus without complication ([place]) 94/85/4627   Paresthesia 03/30/2016   Advance care planning 08/09/2014   GERD (gastroesophageal reflux disease) 08/09/2014   Cough 11/28/2013   Plantar fasciitis 09/23/2013   Routine general medical examination at a health care facility 04/01/2011   Hyperlipidemia 03/28/2010   Multinodular goiter 03/28/2010    Past Surgical History:  Procedure Laterality Date   CHOLECYSTECTOMY     TONSILLECTOMY      Prior to Admission medications   Medication Sig Start Date End Date Taking? Authorizing Provider  albuterol (PROVENTIL HFA;VENTOLIN HFA) 108 (90 Base) MCG/ACT inhaler Inhale 1-2 puffs into the lungs every 6 (six) hours as needed for wheezing or shortness of breath.    [provider]  citalopram (CELEXA) 20 MG tablet Take 0.5 tablets (10 mg total) by mouth daily. 06/25/18   Tonia Ghent, MD  cyclobenzaprine (FLEXERIL) 5 MG tablet Take 1 tablet (5 mg total) by mouth 3 (three) times daily as needed for muscle spasms. 11/20/18   Hinda Kehr, MD  EPINEPHrine (AUVI-Q) 0.3 mg/0.3 mL IJ SOAJ injection Inject 0.3 mg into the muscle as needed for anaphylaxis.    [provider]  gabapentin (NEURONTIN) 100 MG capsule  Take 1-2 capsules (100-200 mg total) by mouth 3 (three) times daily as needed (sedation caution). 11/05/18   Tonia Ghent, MD  lidocaine (LIDODERM) 5 % Place 1 patch onto the skin every 12 (twelve) hours. Remove & Discard patch within 12 hours or as directed by MD.  Pershing Proud the patch off for 12 hours before applying a new one. 11/20/18 11/20/19  Hinda Kehr, MD  loratadine (CLARITIN) 10 MG tablet Take 1 tablet (10 mg total) by mouth daily as needed for allergies. 04/19/18   Tonia Ghent, MD  metFORMIN (GLUCOPHAGE) 500 MG tablet Take 1 tablet (500 mg total) by mouth 2 (two) times daily with a meal. 11/05/18   Tonia Ghent, MD  pantoprazole (PROTONIX) 20 MG tablet Take 1 tablet (20 mg total) by mouth daily. 06/25/18   Tonia Ghent, MD  predniSONE (DELTASONE) 10 MG tablet Take 6 tabs (60 mg) PO x 3 days, then take 4 tabs (40 mg) PO x 3 days, then take 2 tabs (20 mg) PO x 3 days, then take 1 tab (10 mg) PO x 3 days, then take 1/2 tab (5 mg) PO x 4 days. 11/20/18   Hinda Kehr, MD  tolterodine (DETROL LA) 4 MG 24 hr capsule Take 1 capsule (4 mg total) by mouth daily. 07/30/18   Tonia Ghent, MD    Allergies Ace inhibitors, Angiotensin receptor blockers, and Penicillins  Family History  Problem Relation Age of Onset   Hypertension Mother    Depression Mother        bipolar   Diabetes Mother        diet controlled   COPD Mother    Thyroid disease Mother    Diabetes Father    Thyroid disease Father    Heart disease Father    Thyroid disease Brother    Hypertension Brother    Diabetes Brother    Colon cancer Neg Hx    Prostate cancer Neg Hx     Social History Social History   Tobacco Use   Smoking status: Never Smoker   Smokeless tobacco: Never Used  Substance Use Topics   Alcohol use: No    Alcohol/week: 0.0 standard drinks    Comment: occassionally   Drug use: No    Review of Systems Constitutional: No fever/chills Cardiovascular: Denies chest pain. Respiratory: Denies shortness of breath. Gastrointestinal: No abdominal pain.  No nausea, no vomiting.  No diarrhea.  No constipation. Genitourinary: Negative for dysuria. Musculoskeletal: Low back pain as described above Integumentary: Negative for rash. Neurological: Occasional pain radiating from low back to legs.  Negative for headaches, focal weakness or numbness.   ____________________________________________   PHYSICAL EXAM:  VITAL SIGNS: ED Triage Vitals  Enc Vitals Group     BP 11/20/18 0339 (!) 143/84     Pulse Rate 11/20/18 0339 75     Resp 11/20/18 0339 18     Temp 11/20/18 0339 98.4 F (36.9  C)     Temp Source 11/20/18 0339 Oral     SpO2 11/20/18 0339 95 %     Weight 11/20/18 0340 135.8 kg (299 lb 6.2 oz)     Height 11/20/18 0340 1.753 m (5\' 9" )     Head Circumference --      Peak Flow --      Pain Score 11/20/18 0340 9     Pain Loc --      Pain Edu? --      Excl. in [place]? --  Constitutional: Alert and oriented.  Generally well-appearing but does appear uncomfortable. Eyes: Conjunctivae are normal.  Cardiovascular: Normal rate, regular rhythm. Good peripheral circulation. Respiratory: Normal respiratory effort.  No retractions.  Musculoskeletal: Tenderness to palpation of the soft tissues of the lower back on both sides with no point tenderness of the lumbar spine.  No evidence of cellulitis, no fluctuance nor induration.  The patient is ambulatory but with difficulty due to the pain. Neurologic:  Normal speech and language. No gross focal neurologic deficits are appreciated although his ability to participate in a neurological exam is limited by pain. Skin:  Skin is warm, dry and intact. No rash noted. Psychiatric: Mood and affect are normal. Speech and behavior are normal.  ____________________________________________   LABS (all labs ordered are listed, but only abnormal results are displayed)  Labs Reviewed - No data to display ____________________________________________  EKG  None - EKG not ordered by ED physician ____________________________________________  RADIOLOGY   ED MD interpretation: I reviewed the MRI that was obtained as an outpatient 2 days ago and he has disc disease with disc height loss and bulging disks and some foraminal stenosis primarily in the L3-L4 and L4-L5 spaces.  Official radiology report(s): No results found.  ____________________________________________   PROCEDURES   Procedure(s) performed (including Critical Care):  Procedures   ____________________________________________   INITIAL IMPRESSION / MDM /  [place] / ED COURSE  As part of my medical decision making, I reviewed the following data within the [facility] notes reviewed and incorporated, Labs reviewed , Old chart reviewed, Notes from prior ED visits and [place] Controlled Substance Database      *Lakendrick Paradis was evaluated in Emergency Department on 11/20/2018 for the symptoms described in the history of present illness. He was evaluated in the context of the global COVID-19 pandemic, which necessitated consideration that the patient might be at risk for infection with the SARS-CoV-2 virus that causes COVID-19. Institutional protocols and algorithms that pertain to the evaluation of patients at risk for COVID-19 are in a state of rapid change based on information released by regulatory bodies including the CDC and federal and state organizations. These policies and algorithms were followed during the patient's care in the ED.  Some ED evaluations and interventions may be delayed as a result of limited staffing during the pandemic.*  Differential diagnosis includes, but is not limited to, acute on chronic back pain, musculoskeletal strain, cauda equina syndrome, degenerative disc disease.  The patient is obviously uncomfortable and I gave him 2 Percocet.  I reviewed his MRI results and I think he would benefit from seeing a back specialist but there is no evidence of an acute or emergent condition tonight.  There is no evidence that he has developed cauda equina syndrome since the imaging was obtained less than 48 hours ago.  I went over the results of the MRI with him and he agrees to the plan for outpatient follow-up with Dr. Lacinda Axon with neurosurgery.  I sent Dr. Lacinda Axon a message through [facility] as well as giving the patient the appropriate contact information.  I am prescribing him a prednisone taper but I warned him that this will drive up his blood glucose.  He is willing to give it a try because it has been successful  in the past.  I am also giving him a prescription for Flexeril and Lidoderm patches.  I gave my usual customary return precautions.      ____________________________________________  FINAL  CLINICAL IMPRESSION(S) / ED DIAGNOSES  Final diagnoses:  Chronic bilateral low back pain, unspecified whether sciatica present     MEDICATIONS GIVEN DURING THIS VISIT:  Medications  lidocaine (LIDODERM) 5 % 1 patch (1 patch Transdermal Patch Applied 11/20/18 0611)  predniSONE (DELTASONE) tablet 60 mg (60 mg Oral Given 11/20/18 0610)  oxyCODONE-acetaminophen (PERCOCET/ROXICET) 5-325 MG per tablet 2 tablet (2 tablets Oral Given 11/20/18 2725)     ED Discharge Orders         Ordered    cyclobenzaprine (FLEXERIL) 5 MG tablet  3 times daily PRN     11/20/18 0603    predniSONE (DELTASONE) 10 MG tablet     11/20/18 0603    lidocaine (LIDODERM) 5 %  Every 12 hours     11/20/18 0603           Note:  This document was prepared using Dragon voice recognition software and may include unintentional dictation errors.   Hinda Kehr, MD 11/20/18 [phone]

## 2018-11-20 NOTE — ED Triage Notes (Signed)
Patient ambulatory to triage with complaints of lower back for approx 45 days.  Constant Sharp pain in lower lower back.   Pt denies taking back pain medications before coming to ED.  Speaking in complete coherent sentences. No acute breathing distress noted.  Hx of DM   MRI on 6/11, PCP states lower back "normal"

## 2018-11-22 ENCOUNTER — Other Ambulatory Visit: Payer: Self-pay

## 2018-11-22 ENCOUNTER — Ambulatory Visit: Payer: 59 | Admitting: Urology

## 2018-11-22 ENCOUNTER — Encounter: Payer: Self-pay | Admitting: Urology

## 2018-11-22 VITALS — BP 138/84 | HR 69 | Ht 69.0 in | Wt 306.0 lb

## 2018-11-22 DIAGNOSIS — R350 Frequency of micturition: Secondary | ICD-10-CM

## 2018-11-22 LAB — BLADDER SCAN AMB NON-IMAGING

## 2018-11-22 MED ORDER — TOLTERODINE TARTRATE ER 4 MG PO CP24: 4.0000 mg | ORAL_CAPSULE | Freq: Every day | ORAL | 3 refills | Status: DC

## 2018-11-22 NOTE — Progress Notes (Signed)
11/22/2018 2:32 PM   Joslyn Hy 1976-05-30 [id number]  Referring provider: Tonia Ghent, MD [address] [place],  [place] [address]  Chief Complaint  Patient presents with  . Urinary Frequency    HPI: The patient has microscopic hematuria. The urinalysis today was negative. The role of adding contrast to a CT scan and cystoscopy was discussed. He does have significant nighttime frequency and with his age I wonder if he should follow-up with his primary care physician to recheck his glucose. The role of desmopressin may be discussed next time  The more I spoke to the patient he does work night shifts and voids frequently day and night. His hemoglobin A1c is a little bit worse and I urged him to see his primary care physician.   CT scan and cystoscopy normal.   Frequency and nighttime frequency much better and I believe he is on tolterodine.  The Myrbetriq worked but Insurance underwriter did not cover it well.     Frequency and nighttime frequency greatly improved on Detrol.  Very pleased.  No bladder infections  He is 42 and we did not do a rectal examination  903 sent to pharmacy and I will see him in 1 year   PMH: Past Medical History:  Diagnosis Date  . Anxiety   . Diabetes ([place])   . History of heartburn   . HLD (hyperlipidemia)   . Leg cramps    history of  . Migraines    with photo and phonophobia  . Multiple thyroid nodules   . Plantar fasciitis    L foot, injected at foot center 2014  . Salmonella enteritis 2011   stool culture positive    Surgical History: Past Surgical History:  Procedure Laterality Date  . CHOLECYSTECTOMY    . TONSILLECTOMY      Home Medications:  Allergies as of 11/22/2018      Reactions   Ace Inhibitors    H/o lip swelling.    Angiotensin Receptor Blockers    H/o lip swelling.    Penicillins    Has patient had a PCN reaction causing immediate rash, facial/tongue/throat swelling, SOB or lightheadedness with  hypotension: No Has patient had a PCN reaction causing severe rash involving mucus membranes or skin necrosis: No Has patient had a PCN reaction that required hospitalization: No Has patient had a PCN reaction occurring within the last 10 years: No If all of the above answers are "NO", then may proceed with Cephalosporin use.      Medication List       Accurate as of November 22, 2018  2:32 PM. If you have any questions, ask your nurse or doctor.        albuterol 108 (90 Base) MCG/ACT inhaler Commonly known as: VENTOLIN HFA Inhale 1-2 puffs into the lungs every 6 (six) hours as needed for wheezing or shortness of breath.   Auvi-Q 0.3 mg/0.3 mL Soaj injection Generic drug: EPINEPHrine Inject 0.3 mg into the muscle as needed for anaphylaxis.   citalopram 20 MG tablet Commonly known as: CELEXA Take 0.5 tablets (10 mg total) by mouth daily.   cyclobenzaprine 5 MG tablet Commonly known as: FLEXERIL Take 1 tablet (5 mg total) by mouth 3 (three) times daily as needed for muscle spasms.   gabapentin 100 MG capsule Commonly known as: NEURONTIN Take 1-2 capsules (100-200 mg total) by mouth 3 (three) times daily as needed (sedation caution).   lidocaine 5 % Commonly known as: Lidoderm Place 1  patch onto the skin every 12 (twelve) hours. Remove & Discard patch within 12 hours or as directed by MD.  [place]hing Proud the patch off for 12 hours before applying a new one.   loratadine 10 MG tablet Commonly known as: CLARITIN Take 1 tablet (10 mg total) by mouth daily as needed for allergies.   meloxicam 15 MG tablet Commonly known as: MOBIC Take 15 mg by mouth daily.   metFORMIN 500 MG tablet Commonly known as: GLUCOPHAGE Take 1 tablet (500 mg total) by mouth 2 (two) times daily with a meal.   pantoprazole 20 MG tablet Commonly known as: Protonix Take 1 tablet (20 mg total) by mouth daily.   predniSONE 10 MG tablet Commonly known as: DELTASONE Take 6 tabs (60 mg) PO x 3 days, then take 4  tabs (40 mg) PO x 3 days, then take 2 tabs (20 mg) PO x 3 days, then take 1 tab (10 mg) PO x 3 days, then take 1/2 tab (5 mg) PO x 4 days.   tolterodine 4 MG 24 hr capsule Commonly known as: Detrol LA Take 1 capsule (4 mg total) by mouth daily.       Allergies:  Allergies  Allergen Reactions  . Ace Inhibitors     H/o lip swelling.   . Angiotensin Receptor Blockers     H/o lip swelling.   Marland Kitchen Penicillins     Has patient had a PCN reaction causing immediate rash, facial/tongue/throat swelling, SOB or lightheadedness with hypotension: No Has patient had a PCN reaction causing severe rash involving mucus membranes or skin necrosis: No Has patient had a PCN reaction that required hospitalization: No Has patient had a PCN reaction occurring within the last 10 years: No If all of the above answers are "NO", then may proceed with Cephalosporin use.     Family History: Family History  Problem Relation Age of Onset  . Hypertension Mother   . Depression Mother        bipolar  . Diabetes Mother        diet controlled  . COPD Mother   . Thyroid disease Mother   . Diabetes Father   . Thyroid disease Father   . Heart disease Father   . Thyroid disease Brother   . Hypertension Brother   . Diabetes Brother   . Colon cancer Neg Hx   . Prostate cancer Neg Hx     Social History:  reports that he has never smoked. He has never used smokeless tobacco. He reports that he does not drink alcohol or use drugs.  ROS:                                        Physical Exam: There were no vitals taken for this visit.  Constitutional:  Alert and oriented, No acute distress.   Laboratory Data: Lab Results  Component Value Date   WBC 11.2 (H) 07/14/2017   HGB 15.0 07/14/2017   HCT 45.0 07/14/2017   MCV 89.9 07/14/2017   PLT 256 07/14/2017    Lab Results  Component Value Date   CREATININE 0.94 07/23/2018    No results found for: PSA  No results found for:  TESTOSTERONE  Lab Results  Component Value Date   HGBA1C 7.8 (A) 11/05/2018    Urinalysis    Component Value Date/Time   COLORURINE YELLOW 07/17/2017 0900   APPEARANCEUR  Clear 10/19/2017 0916   LABSPEC 1.025 07/17/2017 0900   PHURINE 6.5 07/17/2017 0900   GLUCOSEU Negative 10/19/2017 0916   GLUCOSEU NEGATIVE 07/17/2017 0900   HGBUR TRACE-INTACT (A) 07/17/2017 0900   BILIRUBINUR Negative 10/19/2017 0916   KETONESUR NEGATIVE 07/17/2017 0900   PROTEINUR Negative 10/19/2017 0916   PROTEINUR NEGATIVE 07/14/2017 1206   UROBILINOGEN 0.2 07/17/2017 0900   NITRITE Negative 10/19/2017 0916   NITRITE NEGATIVE 07/17/2017 0900   LEUKOCYTESUR Trace (A) 10/19/2017 0916    Pertinent Imaging:   Assessment & Plan: 1 year  1. Urinary frequency  - BLADDER SCAN AMB NON-IMAGING   No follow-ups on file.  Reece Packer, MD  [facility] [address], [place], [place] [address] [phone]

## 2019-01-18 ENCOUNTER — Other Ambulatory Visit: Payer: Self-pay

## 2019-01-18 MED ORDER — METFORMIN HCL 500 MG PO TABS: 500.0000 mg | ORAL_TABLET | Freq: Two times a day (BID) | ORAL | 3 refills | Status: DC

## 2019-01-18 NOTE — Telephone Encounter (Signed)
Spoke with patient to let him know we were getting fax from [facility] for refill on Metformin. This was sent to [facility] in may. Patient is using [facility] so RX is sent there now.

## 2019-01-28 ENCOUNTER — Ambulatory Visit: Payer: 59 | Admitting: Family Medicine

## 2019-01-28 ENCOUNTER — Other Ambulatory Visit: Payer: Self-pay

## 2019-01-28 ENCOUNTER — Encounter: Payer: Self-pay | Admitting: Family Medicine

## 2019-01-28 VITALS — BP 122/76 | HR 72 | Temp 98.0°F | Ht 69.0 in | Wt 294.4 lb

## 2019-01-28 DIAGNOSIS — R059 Cough, unspecified: Secondary | ICD-10-CM

## 2019-01-28 DIAGNOSIS — Z23 Encounter for immunization: Secondary | ICD-10-CM

## 2019-01-28 DIAGNOSIS — E119 Type 2 diabetes mellitus without complications: Secondary | ICD-10-CM

## 2019-01-28 DIAGNOSIS — R05 Cough: Secondary | ICD-10-CM

## 2019-01-28 DIAGNOSIS — R21 Rash and other nonspecific skin eruption: Secondary | ICD-10-CM

## 2019-01-28 LAB — POCT GLYCOSYLATED HEMOGLOBIN (HGB A1C): Hemoglobin A1C: 7.3 % — AB (ref 4.0–5.6)

## 2019-01-28 MED ORDER — TRIAMCINOLONE ACETONIDE 0.1 % EX CREA: 1.0000 "application " | TOPICAL_CREAM | Freq: Two times a day (BID) | CUTANEOUS | 1 refills | Status: DC | PRN

## 2019-01-28 MED ORDER — FLUTICASONE PROPIONATE 50 MCG/ACT NA SUSP: 2.0000 | Freq: Every day | NASAL | 6 refills | Status: DC

## 2019-01-28 NOTE — Progress Notes (Signed)
Diabetes:  Using medications without difficulties: has been taking metformin 500mg  qAM Hypoglycemic episodes: no Hyperglycemic episodes: no Feet problems: no Blood Sugars averaging: ~160 eye exam within last year: due, he called with visit delayed until the fall.   A1c 7.8---->7.3.   Diet and exercise d/w pt.  He got moved to second shift.   Flu shot today.    His back pain is better.  Not taking gabapentin since initial use.   Itchy rash on the L ankle.  TAC prev helped, needed refill.   He has episodic flare of cough.  No sputum, no fevers.  He doesn't feel unwell.  Some post nasal gtt.  Afrin helped.  D/w pt about trial of flonase.   Meds, vitals, and allergies reviewed.   ROS: Per HPI unless specifically indicated in ROS section   GEN: nad, alert and oriented HEENT: ncat, OP wnl.  NECK: supple w/o LA CV: rrr. PULM: ctab, no inc wob ABD: soft, +bs EXT: no edema SKIN: blanching faint rash on the L anterior ankle.    Diabetic foot exam: Normal inspection No skin breakdown No calluses  Normal DP pulses Normal sensation to light touch and monofilament Nails normal

## 2019-01-28 NOTE — Patient Instructions (Addendum)
Flu shot today.  Increase the metformin to twice a day.   If your have trouble tolerating it, then cut back to one a day.  Start flonase and let me know if the cough isn't better.  Recheck in about 3 months, labs at the visit.   Use triamcinolone as needed for the rash.  Take care.  Glad to see you.

## 2019-01-30 DIAGNOSIS — R21 Rash and other nonspecific skin eruption: Secondary | ICD-10-CM | POA: Insufficient documentation

## 2019-01-30 NOTE — Assessment & Plan Note (Signed)
Use triamcinolone and update me as needed.  He agrees.

## 2019-01-30 NOTE — Assessment & Plan Note (Signed)
A1c 7.8---->7.3.   Diet and exercise d/w pt.  He got moved to second shift.  Increase metformin to twice a day.   If any trouble tolerating it, then cut back to one a day.  Recheck in about 3 months, labs at the visit.

## 2019-01-30 NOTE — Assessment & Plan Note (Signed)
Start flonase and he will let me know if the cough isn't better.  Lungs are clear.

## 2019-04-25 ENCOUNTER — Other Ambulatory Visit: Payer: Self-pay | Admitting: Family Medicine

## 2019-05-02 ENCOUNTER — Ambulatory Visit: Payer: 59 | Admitting: Family Medicine

## 2019-05-02 ENCOUNTER — Other Ambulatory Visit: Payer: Self-pay

## 2019-05-02 ENCOUNTER — Encounter: Payer: Self-pay | Admitting: Family Medicine

## 2019-05-02 VITALS — BP 130/78 | HR 71 | Temp 97.9°F | Ht 69.0 in | Wt 303.3 lb

## 2019-05-02 DIAGNOSIS — R21 Rash and other nonspecific skin eruption: Secondary | ICD-10-CM

## 2019-05-02 DIAGNOSIS — E119 Type 2 diabetes mellitus without complications: Secondary | ICD-10-CM | POA: Diagnosis not present

## 2019-05-02 DIAGNOSIS — M7741 Metatarsalgia, right foot: Secondary | ICD-10-CM | POA: Diagnosis not present

## 2019-05-02 LAB — POCT GLYCOSYLATED HEMOGLOBIN (HGB A1C): Hemoglobin A1C: 7.4 % — AB (ref 4.0–5.6)

## 2019-05-02 NOTE — Patient Instructions (Addendum)
Dropped metatarsal head- use the metartasal pad that that should help.  Use the TAC cream as needed.  Take care.  Glad to see you.  Thanks for getting a flu shot.  Schedule a physical for Feb or March 2021.  Labs ahead of time.  A1c 7.4 today.

## 2019-05-02 NOTE — Progress Notes (Signed)
This visit occurred during the SARS-CoV-2 public health emergency.  Safety protocols were in place, including screening questions prior to the visit, additional usage of staff PPE, and extensive cleaning of exam room while observing appropriate contact time as indicated for disinfecting solutions.   Diabetes:  Using medications without difficulties: yes Hypoglycemic episodes:no Hyperglycemic episodes:no Feet problems: itching skin on the R dorsal foot, better with TAC cream, recent restarted.  Distal R 2nd/3rd MT head tenderness.   Blood Sugars averaging: none >200.  Closer to ~100 eye exam within last year: pending for 05/13/2019.   Diet and exercise d/w pt.   A1c discussed with patient  His mood/panic is clearly better off 3rd shift, he stopped SSRI and is doing well.  He is on 2nd shift.    He is off gabapentin.   Meds, vitals, and allergies reviewed.   ROS: Per HPI unless specifically indicated in ROS section   GEN: nad, alert and oriented HEENT: ncat NECK: supple w/o LA CV: rrr. PULM: ctab, no inc wob ABD: soft, +bs EXT: no edema SKIN: No rash other than some mildly irritated skin on the dorsum of the right foot  Diabetic foot exam: Normal inspection except for right foot rash as described above. No skin breakdown No calluses  Normal DP pulses Normal sensation to light touch and monofilament Nails normal He does have pain along the right third distal metatarsal head that clearly improves with weightbearing while using a metatarsal pad proximally.

## 2019-05-04 DIAGNOSIS — M7740 Metatarsalgia, unspecified foot: Secondary | ICD-10-CM | POA: Insufficient documentation

## 2019-05-04 NOTE — Assessment & Plan Note (Signed)
Discussed with patient Dropped metatarsal head- use the metartasal pad that that should help.  He can update me as needed.

## 2019-05-04 NOTE — Assessment & Plan Note (Signed)
Use triamcinolone as needed and update me as needed.  He agrees.

## 2019-05-04 NOTE — Assessment & Plan Note (Signed)
I thanked him for getting a flu shot. Recheck at a physical for Feb or March 2021.  Labs ahead of time.  A1c 7.4 today.  Continue work on diet and exercise.

## 2019-05-13 LAB — HM DIABETES EYE EXAM

## 2019-06-07 ENCOUNTER — Encounter: Payer: Self-pay | Admitting: Family Medicine

## 2019-06-24 ENCOUNTER — Other Ambulatory Visit: Payer: Self-pay

## 2019-06-24 ENCOUNTER — Other Ambulatory Visit: Payer: Self-pay | Admitting: Podiatry

## 2019-06-24 ENCOUNTER — Ambulatory Visit: Payer: 59 | Admitting: Podiatry

## 2019-06-24 ENCOUNTER — Encounter: Payer: Self-pay | Admitting: Podiatry

## 2019-06-24 ENCOUNTER — Ambulatory Visit (INDEPENDENT_AMBULATORY_CARE_PROVIDER_SITE_OTHER): Payer: 59

## 2019-06-24 DIAGNOSIS — M722 Plantar fascial fibromatosis: Secondary | ICD-10-CM

## 2019-06-24 DIAGNOSIS — M7751 Other enthesopathy of right foot: Secondary | ICD-10-CM

## 2019-06-24 MED ORDER — DICLOFENAC SODIUM 75 MG PO TBEC: 75.0000 mg | DELAYED_RELEASE_TABLET | Freq: Two times a day (BID) | ORAL | 1 refills | Status: DC

## 2019-06-26 NOTE — Progress Notes (Signed)
   HPI: 44 y.o. male presenting today with a chief complaint of intermittent right plantar forefoot pain that started about 3-4 weeks ago. He states it feels like there is a ball under the foot. He states the pain radiates into the arch. Walking and driving increases the pain. He has not had any treatment for the symptoms. Patient is here for further evaluation and treatment.   Past Medical History:  Diagnosis Date  . Anxiety   . Diabetes ([place])   . History of heartburn   . HLD (hyperlipidemia)   . Leg cramps    history of  . Migraines    with photo and phonophobia  . Multiple thyroid nodules   . Plantar fasciitis    L foot, injected at foot center 2014  . Salmonella enteritis 2011   stool culture positive     Physical Exam: General: The patient is alert and oriented x3 in no acute distress.  Dermatology: Skin is warm, dry and supple bilateral lower extremities. Negative for open lesions or macerations.  Vascular: Palpable pedal pulses bilaterally. No edema or erythema noted. Capillary refill within normal limits.  Neurological: Epicritic and protective threshold grossly intact bilaterally.   Musculoskeletal Exam: Pain with palpation noted to the 3rd MPJ of the right foot. Range of motion within normal limits to all pedal and ankle joints bilateral. Muscle strength 5/5 in all groups bilateral.   Radiographic Exam:  Normal osseous mineralization. Joint spaces preserved. No fracture/dislocation/boney destruction.    Assessment: 1. 3rd MPJ capsulitis right    Plan of Care:  1. Patient evaluated. X-Rays reviewed.  2. Injection of 0.5 mLs Celestone Soluspan injected into the 3rd MPJ of the right foot.  3. Prescription for Diclofenac provided to patient. 4. Met pads dispensed.  5. Return to clinic in 4 weeks.   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      Edrick Kins, DPM [facility]  Dr. Edrick Kins, DPM    [address], [place] [address]                Office [phone]  Fax [phone]

## 2019-07-22 ENCOUNTER — Ambulatory Visit: Payer: 59 | Admitting: Podiatry

## 2019-08-03 ENCOUNTER — Other Ambulatory Visit: Payer: Self-pay | Admitting: Family Medicine

## 2019-08-03 DIAGNOSIS — E119 Type 2 diabetes mellitus without complications: Secondary | ICD-10-CM

## 2019-08-10 ENCOUNTER — Other Ambulatory Visit (INDEPENDENT_AMBULATORY_CARE_PROVIDER_SITE_OTHER): Payer: 59

## 2019-08-10 ENCOUNTER — Other Ambulatory Visit: Payer: Self-pay

## 2019-08-10 DIAGNOSIS — E119 Type 2 diabetes mellitus without complications: Secondary | ICD-10-CM

## 2019-08-10 LAB — LIPID PANEL
Cholesterol: 160 mg/dL (ref 0–200)
HDL: 30 mg/dL — ABNORMAL LOW (ref >39.00)
LDL Cholesterol: 108 mg/dL — ABNORMAL HIGH (ref 0–99)
NonHDL: 129.57
Total CHOL/HDL Ratio: 5
Triglycerides: 106 mg/dL (ref 0.0–149.0)
VLDL: 21.2 mg/dL (ref 0.0–40.0)

## 2019-08-10 LAB — COMPREHENSIVE METABOLIC PANEL
ALT: 26 U/L (ref 0–53)
AST: 15 U/L (ref 0–37)
Albumin: 3.8 g/dL (ref 3.5–5.2)
Alkaline Phosphatase: 65 U/L (ref 39–117)
BUN: 13 mg/dL (ref 6–23)
CO2: 33 mEq/L — ABNORMAL HIGH (ref 19–32)
Calcium: 9 mg/dL (ref 8.4–10.5)
Chloride: 99 mEq/L (ref 96–112)
Creatinine, Ser: 0.79 mg/dL (ref 0.40–1.50)
GFR: 106.44 mL/min (ref >60.00)
Glucose, Bld: 169 mg/dL — ABNORMAL HIGH (ref 70–99)
Potassium: 4 mEq/L (ref 3.5–5.1)
Sodium: 137 mEq/L (ref 135–145)
Total Bilirubin: 0.5 mg/dL (ref 0.2–1.2)
Total Protein: 7.2 g/dL (ref 6.0–8.3)

## 2019-08-10 LAB — MICROALBUMIN / CREATININE URINE RATIO
Creatinine,U: 107.8 mg/dL
Microalb Creat Ratio: 0.8 mg/g (ref 0.0–30.0)
Microalb, Ur: 0.9 mg/dL (ref 0.0–1.9)

## 2019-08-10 LAB — HEMOGLOBIN A1C: Hgb A1c MFr Bld: 8.4 % — ABNORMAL HIGH (ref 4.6–6.5)

## 2019-08-10 NOTE — Addendum Note (Signed)
Addended by: Ellamae Sia on: 08/10/2019 08:47 AM   Modules accepted: Orders

## 2019-08-15 ENCOUNTER — Encounter: Payer: Self-pay | Admitting: Family Medicine

## 2019-08-15 ENCOUNTER — Other Ambulatory Visit: Payer: Self-pay

## 2019-08-15 ENCOUNTER — Ambulatory Visit (INDEPENDENT_AMBULATORY_CARE_PROVIDER_SITE_OTHER): Payer: 59 | Admitting: Family Medicine

## 2019-08-15 VITALS — BP 112/76 | HR 72 | Temp 97.6°F | Resp 12 | Ht 69.25 in | Wt 308.1 lb

## 2019-08-15 DIAGNOSIS — E119 Type 2 diabetes mellitus without complications: Secondary | ICD-10-CM

## 2019-08-15 DIAGNOSIS — Z7189 Other specified counseling: Secondary | ICD-10-CM

## 2019-08-15 DIAGNOSIS — F419 Anxiety disorder, unspecified: Secondary | ICD-10-CM

## 2019-08-15 DIAGNOSIS — K219 Gastro-esophageal reflux disease without esophagitis: Secondary | ICD-10-CM

## 2019-08-15 DIAGNOSIS — Z0000 Encounter for general adult medical examination without abnormal findings: Secondary | ICD-10-CM | POA: Diagnosis not present

## 2019-08-15 MED ORDER — METFORMIN HCL 500 MG PO TABS: 500.0000 mg | ORAL_TABLET | Freq: Two times a day (BID) | ORAL | 3 refills | Status: DC

## 2019-08-15 NOTE — Progress Notes (Signed)
This visit occurred during the SARS-CoV-2 public health emergency.  Safety protocols were in place, including screening questions prior to the visit, additional usage of staff PPE, and extensive cleaning of exam room while observing appropriate contact time as indicated for disinfecting solutions.  CPE- See plan.  Routine anticipatory guidance given to patient.  See health maintenance.  The possibility exists that previously documented standard health maintenance information may have been brought forward from a previous encounter into this note.  If needed, that same information has been updated to reflect the current situation based on today's encounter.    He is working swing shifts, d/w pt.   He had foot injection done with relief of pain.    He is off celexa and panic is resolved.  He is doing well.  No SI/HI.  He felt well enough to stay off the medication at this point.  GERD controlled with PPI.  No ADE on med.    Tetanus 2018 Flu 2020 PNA and shingles not due covid vaccine d/w pt.   Colon cancer screening and PSA not due.  Living will d/w pt. Mother designated if patient were incapacitated.  Diet and exercise d/w pt.  Encouraged gradual weight loss.  D/w pt about recent labs.    Diabetes:  Using medications without difficulties: yes Hypoglycemic episodes: no Hyperglycemic episodes:  See below.  Feet problems: see above.   Blood Sugars averaging: up to 200s recently.   eye exam within last year: yes He has been off his diet, affected by work schedule.   Labs d/w pt.    PMH and SH reviewed  Meds, vitals, and allergies reviewed.   ROS: Per HPI.  Unless specifically indicated otherwise in HPI, the patient denies:  General: fever. Eyes: acute vision changes ENT: sore throat Cardiovascular: chest pain Respiratory: SOB GI: vomiting GU: dysuria Musculoskeletal: acute back pain Derm: acute rash Neuro: acute motor dysfunction Psych: worsening mood Endocrine:  polydipsia Heme: bleeding Allergy: hayfever  GEN: nad, alert and oriented HEENT: ncat NECK: supple w/o LA CV: rrr. PULM: ctab, no inc wob ABD: soft, +bs EXT: no edema SKIN: no acute rash  Diabetic foot exam: Normal inspection No skin breakdown No calluses  Normal DP pulses Normal sensation to light touch and monofilament Nails normal

## 2019-08-15 NOTE — Patient Instructions (Signed)
Increase the metformin to 2 pills twice a day if tolerated.  If not tolerated or if your sugar isn't getting better, then let me know.  We'll call about the referral for diabetes education.  Check to see if your insurance will cover it.  Recheck in about 3 months.  A1c at the visit.  Take care.  Glad to see you.

## 2019-08-17 NOTE — Assessment & Plan Note (Signed)
GERD controlled with PPI.  No ADE on med.  Continue as is.

## 2019-08-17 NOTE — Assessment & Plan Note (Signed)
He is off celexa and panic is resolved.  He is doing well.  No SI/HI.  He felt well enough to stay off the medication at this point.

## 2019-08-17 NOTE — Assessment & Plan Note (Signed)
Living will d/w pt. Mother designated if patient were incapacitated.  

## 2019-08-17 NOTE — Assessment & Plan Note (Signed)
Tetanus 2018 Flu 2020 PNA and shingles not due covid vaccine d/w pt.   Colon cancer screening and PSA not due.  Living will d/w pt. Mother designated if patient were incapacitated.  Diet and exercise d/w pt.  Encouraged gradual weight loss.  D/w pt about recent labs.

## 2019-08-17 NOTE — Assessment & Plan Note (Signed)
He has been off his diet, affected by work schedule.   Labs d/w pt.   Increase the metformin to 2 pills twice a day if tolerated.  If not tolerated or if sugar isn't getting better, then he will let me know.  We'll call about the referral for diabetes education.  He can check to see if his insurance will cover it.  Recheck in about 3 months.  A1c at the visit.

## 2019-08-27 ENCOUNTER — Encounter: Payer: Self-pay | Admitting: Family Medicine

## 2019-08-27 NOTE — Telephone Encounter (Signed)
Will send to Dr Damita Dunnings was FYI  Awaiting results so that we can give further recommendations.

## 2019-08-29 ENCOUNTER — Telehealth: Payer: Self-pay | Admitting: Family Medicine

## 2019-08-29 NOTE — Telephone Encounter (Signed)
Patient's mother called today She wanted to let you know that the patient's covid test came back negative and that he has also returned to work

## 2019-08-30 NOTE — Telephone Encounter (Signed)
Noted.  Thanks.  Glad to hear.  ? ?

## 2019-09-22 ENCOUNTER — Telehealth: Payer: Self-pay

## 2019-09-22 NOTE — Telephone Encounter (Signed)
Noted  

## 2019-09-22 NOTE — Telephone Encounter (Signed)
Pt states his insurance will not pay for nutrition services and wants referral closed.

## 2019-10-12 ENCOUNTER — Other Ambulatory Visit: Payer: Self-pay

## 2019-10-12 ENCOUNTER — Emergency Department
Admission: EM | Admit: 2019-10-12 | Discharge: 2019-10-12 | Disposition: A | Discharge status: Home / Self Care | Payer: 59 | Attending: Emergency Medicine | Admitting: Emergency Medicine

## 2019-10-12 ENCOUNTER — Encounter: Payer: Self-pay | Admitting: Emergency Medicine

## 2019-10-12 DIAGNOSIS — M5416 Radiculopathy, lumbar region: Secondary | ICD-10-CM | POA: Diagnosis not present

## 2019-10-12 DIAGNOSIS — Z9049 Acquired absence of other specified parts of digestive tract: Secondary | ICD-10-CM | POA: Insufficient documentation

## 2019-10-12 DIAGNOSIS — Z7984 Long term (current) use of oral hypoglycemic drugs: Secondary | ICD-10-CM | POA: Diagnosis not present

## 2019-10-12 DIAGNOSIS — M5431 Sciatica, right side: Secondary | ICD-10-CM | POA: Diagnosis not present

## 2019-10-12 DIAGNOSIS — M545 Low back pain: Secondary | ICD-10-CM | POA: Diagnosis present

## 2019-10-12 DIAGNOSIS — E119 Type 2 diabetes mellitus without complications: Secondary | ICD-10-CM | POA: Diagnosis not present

## 2019-10-12 DIAGNOSIS — Z79899 Other long term (current) drug therapy: Secondary | ICD-10-CM | POA: Insufficient documentation

## 2019-10-12 MED ORDER — LIDOCAINE 5 % EX PTCH
2.0000 | MEDICATED_PATCH | CUTANEOUS | Status: DC
  Administered 2019-10-12: 2 via TRANSDERMAL
  Filled 2019-10-12: qty 2

## 2019-10-12 MED ORDER — OXYCODONE-ACETAMINOPHEN 5-325 MG PO TABS
1.0000 | ORAL_TABLET | Freq: Once | ORAL | Status: AC
  Administered 2019-10-12: 1 via ORAL
  Filled 2019-10-12: qty 1

## 2019-10-12 MED ORDER — CYCLOBENZAPRINE HCL 10 MG PO TABS: 10.0000 mg | ORAL_TABLET | Freq: Three times a day (TID) | ORAL | 0 refills | Status: DC | PRN

## 2019-10-12 NOTE — ED Triage Notes (Signed)
Patient ambulatory to triage with steady gait, without difficulty or distress noted; pt reports got OOB and began having lower back pain; st hx of same; denies any accomp symptoms

## 2019-10-12 NOTE — ED Notes (Signed)
Pt c/o lower back spasm started last night while laying in bed. Pt states this has happened in past. Pt had relief with muscle relaxers and steroids.

## 2019-10-12 NOTE — ED Provider Notes (Signed)
[facility] Emergency Department Provider Note  ____________________________________________   First MD Initiated Contact with Patient 10/12/19 [phone]     (approximate)  I have reviewed the triage vital signs and the nursing notes.   HISTORY  Chief Complaint Back Pain    HPI Kyle Murphy is a 44 y.o. male with below list of previous medical conditions including chronic low back pain presents to the emergency department secondary to acute onset of low back pain with the patient was arising from bed.  Patient states that the pain does radiate down his posterior right leg.  Patient denies any weakness no numbness.  Patient denies any change in bowel or bladder control.  Patient states that current pain score is 9 out of 10 and worse with movement and walking        Past Medical History:  Diagnosis Date  . Anxiety   . Diabetes ([place])   . History of heartburn   . HLD (hyperlipidemia)   . Leg cramps    history of  . Migraines    with photo and phonophobia  . Multiple thyroid nodules   . Plantar fasciitis    L foot, [facility] at [facility] center 2014  . Salmonella enteritis 2011   stool culture positive    Patient Active Problem List   Diagnosis Date Noted  . Metatarsalgia 05/04/2019  . Rash 01/30/2019  . Sciatica of right side 11/07/2018  . Lower urinary tract symptoms (LUTS) 07/30/2018  . Allergy 07/30/2018  . Lip swelling 04/20/2018  . Skin lesion 01/27/2018  . Hematuria 07/19/2017  . Fatty liver 07/19/2017  . Anxiety 07/26/2016  . Diabetes mellitus without complication ([place]) 123456  . Paresthesia 03/30/2016  . Advance care planning 08/09/2014  . GERD (gastroesophageal reflux disease) 08/09/2014  . Cough 11/28/2013  . Plantar fasciitis 09/23/2013  . Routine general medical examination at a health care facility 04/01/2011  . Hyperlipidemia 03/28/2010  . Multinodular goiter 03/28/2010    Past Surgical History:  Procedure Laterality  Date  . CHOLECYSTECTOMY    . TONSILLECTOMY      Prior to Admission medications   Medication Sig Start Date End Date Taking? Authorizing Provider  diclofenac (VOLTAREN) 75 MG EC tablet Take 1 tablet (75 mg total) by mouth 2 (two) times daily. 06/24/19   Edrick Kins, DPM  EPINEPHrine (AUVI-Q) 0.3 mg/0.3 mL IJ SOAJ injection Inject 0.3 mg into the muscle as needed for anaphylaxis.    [provider]  loratadine (CLARITIN) 10 MG tablet Take 1 tablet (10 mg total) by mouth daily as needed for allergies. 04/19/18   Tonia Ghent, MD  metFORMIN (GLUCOPHAGE) 500 MG tablet Take 1-2 tablets (500-1,000 mg total) by mouth 2 (two) times daily with a meal. 08/15/19   Tonia Ghent, MD  pantoprazole (PROTONIX) 20 MG tablet TAKE 1 TABLET BY MOUTH  DAILY 04/25/19   Tonia Ghent, MD  triamcinolone cream (KENALOG) 0.1 % Apply 1 application topically 2 (two) times daily as needed. 01/28/19   Tonia Ghent, MD    Allergies Ace inhibitors, Angiotensin receptor blockers, and Penicillins  Family History  Problem Relation Age of Onset  . Hypertension Mother   . Depression Mother        bipolar  . Diabetes Mother        diet controlled  . COPD Mother   . Thyroid disease Mother   . Diabetes Father   . Thyroid disease Father   . Heart disease Father   .  Thyroid disease Brother   . Hypertension Brother   . Diabetes Brother   . Colon cancer Neg Hx   . Prostate cancer Neg Hx     Social History Social History   Tobacco Use  . Smoking status: Never Smoker  . Smokeless tobacco: Never Used  Substance Use Topics  . Alcohol use: No    Alcohol/week: 0.0 standard drinks    Comment: occassionally  . Drug use: No    Review of Systems Constitutional: No fever/chills Eyes: No visual changes. ENT: No sore throat. Cardiovascular: Denies chest pain. Respiratory: Denies shortness of breath. Gastrointestinal: No abdominal pain.  No nausea, no vomiting.  No diarrhea.  No  constipation. Genitourinary: Negative for dysuria. Musculoskeletal: Negative for neck pain.  Positive for back pain. Integumentary: Negative for rash. Neurological: Negative for headaches, focal weakness or numbness.  ____________________________________________   PHYSICAL EXAM:  VITAL SIGNS: ED Triage Vitals  Enc Vitals Group     BP 10/12/19 0202 (!) 147/83     Pulse Rate 10/12/19 0202 92     Resp 10/12/19 0202 18     Temp 10/12/19 0202 98.4 F (36.9 C)     Temp Source 10/12/19 0202 Oral     SpO2 10/12/19 0202 96 %     Weight 10/12/19 0202 (!) 139.3 kg (307 lb)     Height 10/12/19 0202 1.778 m (5\' 10" )     Head Circumference --      Peak Flow --      Pain Score 10/12/19 0208 9     Pain Loc --      Pain Edu? --      Excl. in [place]? --     Constitutional: Alert and oriented.  Eyes: Conjunctivae are normal.  Mouth/Throat: Patient is wearing a mask. Neck: No stridor.  No meningeal signs.   Cardiovascular: Normal rate, regular rhythm. Good peripheral circulation. Grossly normal heart sounds. Respiratory: Normal respiratory effort.  No retractions. Gastrointestinal: Soft and nontender. No distention.  Musculoskeletal: No lower extremity tenderness nor edema. No gross deformities of extremities.  No midline tenderness with palpation. Neurologic:  Normal speech and language. No gross focal neurologic deficits are appreciated.  Skin:  Skin is warm, dry and intact. Psychiatric: Mood and affect are normal. Speech and behavior are normal.     Procedures   ____________________________________________   INITIAL IMPRESSION / MDM / ASSESSMENT AND PLAN / ED COURSE  As part of my medical decision making, I reviewed the following data within the electronic MEDICAL RECORD NUMBER   44 year old male presented with above-stated history and physical exam a differential diagnosis including but not limited to lumbar radiculopathy including degenerative disc disease herniated disc sciatica.   Patient symptoms consistent with sciatica.  Patient was given Percocet and a Lidoderm patch was applied to the lumbar region in the emergency department with pain improvement.  Patient will be prescribed Flexeril for home referral to neurosurgery.  ____________________________________________  FINAL CLINICAL IMPRESSION(S) / ED DIAGNOSES  Final diagnoses:  Lumbar radiculopathy  Sciatica of right side     MEDICATIONS GIVEN DURING THIS VISIT:  Medications  lidocaine (LIDODERM) 5 % 2 patch (2 patches Transdermal Patch Applied 10/12/19 0622)  oxyCODONE-acetaminophen (PERCOCET/ROXICET) 5-325 MG per tablet 1 tablet (1 tablet Oral Given 10/12/19 [id number])     ED Discharge Orders    None      *Please note:  Kyle Murphy was evaluated in Emergency Department on 10/12/2019 for the symptoms described in the history of present illness.  He was evaluated in the context of the global COVID-19 pandemic, which necessitated consideration that the patient might be at risk for infection with the SARS-CoV-2 virus that causes COVID-19. Institutional protocols and algorithms that pertain to the evaluation of patients at risk for COVID-19 are in a state of rapid change based on information released by regulatory bodies including the CDC and federal and state organizations. These policies and algorithms were followed during the patient's care in the ED.  Some ED evaluations and interventions may be delayed as a result of limited staffing during the pandemic.*  Note:  This document was prepared using Dragon voice recognition software and may include unintentional dictation errors.   Gregor Hams, MD 10/13/19 [phone]

## 2019-10-30 ENCOUNTER — Encounter: Payer: Self-pay | Admitting: Family Medicine

## 2019-11-02 ENCOUNTER — Other Ambulatory Visit: Payer: Self-pay | Admitting: Family Medicine

## 2019-11-02 MED ORDER — GLIMEPIRIDE 2 MG PO TABS: 2.0000 mg | ORAL_TABLET | Freq: Every day | ORAL | 3 refills | Status: DC

## 2019-11-02 MED ORDER — METFORMIN HCL 500 MG PO TABS: 1000.0000 mg | ORAL_TABLET | Freq: Two times a day (BID) | ORAL | Status: DC

## 2019-11-14 ENCOUNTER — Other Ambulatory Visit: Payer: Self-pay

## 2019-11-14 ENCOUNTER — Ambulatory Visit (INDEPENDENT_AMBULATORY_CARE_PROVIDER_SITE_OTHER): Payer: 59 | Admitting: Family Medicine

## 2019-11-14 ENCOUNTER — Encounter: Payer: Self-pay | Admitting: Family Medicine

## 2019-11-14 VITALS — BP 124/84 | HR 82 | Temp 97.4°F | Ht 70.0 in | Wt 300.1 lb

## 2019-11-14 DIAGNOSIS — E119 Type 2 diabetes mellitus without complications: Secondary | ICD-10-CM

## 2019-11-14 LAB — POCT GLYCOSYLATED HEMOGLOBIN (HGB A1C): Hemoglobin A1C: 9.5 % — AB (ref 4.0–5.6)

## 2019-11-14 MED ORDER — GLIMEPIRIDE 2 MG PO TABS: 2.0000 mg | ORAL_TABLET | Freq: Every day | ORAL | 3 refills | Status: DC

## 2019-11-14 NOTE — Progress Notes (Signed)
This visit occurred during the SARS-CoV-2 public health emergency.  Safety protocols were in place, including screening questions prior to the visit, additional usage of staff PPE, and extensive cleaning of exam room while observing appropriate contact time as indicated for disinfecting solutions.  Diabetes:  Using medications without difficulties: now on metformin 1000mg  BID and 2mg  glimepiride daily.  He feels better with addition of glimepiride with lower sugar.   Hypoglycemic episodes: no Hyperglycemic episodes:up to 400s prev, now down to ~160 with addition of glimepiride  Feet problems: no Blood Sugars averaging: see above.    eye exam within last year: yes  A1c up to 9.5.  Diet d/w pt.  He stopped going to [employer].  He is eating more salads and fruit.  He had is covid vaccine.  D/w pt.   He has ortho f/u pending about his R knee.  He has pain and popping with ROM.    Meds, vitals, and allergies reviewed.  ROS: Per HPI unless specifically indicated in ROS section   GEN: nad, alert and oriented HEENT: ncat NECK: supple w/o LA CV: rrr. PULM: ctab, no inc wob ABD: soft, +bs EXT: no edema SKIN: no acute rash  Diabetic foot exam: Normal inspection No skin breakdown No calluses  Normal DP pulses Normal sensation to light touch and monofilament Nails normal

## 2019-11-14 NOTE — Patient Instructions (Signed)
Keep going with your meds as is.   If you have higher sugars in the meantime then let me know.  Otherwise recheck in about 3 months with A1c at the visit.  Take care.  Glad to see you.

## 2019-11-16 NOTE — Assessment & Plan Note (Signed)
A1c up to 9.5.  Diet d/w pt.  He stopped going to [employer].  He is eating more salads and fruit. His sugar was clearly higher before he added on glimepiride.  That was a recent change.  Recheck A1c at next office visit.  If not a lot better than consider Trulicity.  His brother is taking Trulicity with good effect.  Discussed.  He agrees with plan.

## 2019-11-28 ENCOUNTER — Ambulatory Visit: Payer: 59 | Admitting: Urology

## 2020-02-14 ENCOUNTER — Ambulatory Visit: Payer: 59 | Admitting: Family Medicine

## 2020-02-14 ENCOUNTER — Other Ambulatory Visit: Payer: Self-pay | Admitting: *Deleted

## 2020-02-14 ENCOUNTER — Other Ambulatory Visit: Payer: Self-pay

## 2020-02-14 ENCOUNTER — Encounter: Payer: Self-pay | Admitting: Family Medicine

## 2020-02-14 VITALS — BP 128/82 | HR 77 | Temp 97.1°F | Ht 70.0 in | Wt 308.0 lb

## 2020-02-14 DIAGNOSIS — E119 Type 2 diabetes mellitus without complications: Secondary | ICD-10-CM | POA: Diagnosis not present

## 2020-02-14 LAB — POCT GLYCOSYLATED HEMOGLOBIN (HGB A1C): Hemoglobin A1C: 7 % — AB (ref 4.0–5.6)

## 2020-02-14 MED ORDER — BLOOD GLUCOSE MONITOR KIT: PACK | 0 refills | Status: DC

## 2020-02-14 MED ORDER — KETOCONAZOLE 2 % EX CREA: 1.0000 "application " | TOPICAL_CREAM | Freq: Every day | CUTANEOUS | Status: DC

## 2020-02-14 NOTE — Patient Instructions (Addendum)
Please sign a record release for [facility] to get your records from this year.   Thanks for your effort.  Use ketoconazole on your feet.   You can get that OTC.  We'll work on sending in a new meter.  Plan on recheck at a physical with labs ahead of time in 6 months.  Recheck sooner if needed. Take care.  Glad to see you.

## 2020-02-14 NOTE — Progress Notes (Signed)
This visit occurred during the SARS-CoV-2 public health emergency.  Safety protocols were in place, including screening questions prior to the visit, additional usage of staff PPE, and extensive cleaning of exam room while observing appropriate contact time as indicated for disinfecting solutions.  Diabetes:  Using medications without difficulties: yes, metformin and glimepiride.   Hypoglycemic episodes: no sx, cautions d/w pt about prolonged fasting Hyperglycemic episodes: no sx Feet problems:  Some tinea changes on the toes Blood Sugars averaging: not checked recently.   eye exam within last year: yes A1c down to 7.   He needs new rx for meter.    viscoinjection helped his R knee pain.  Per [facility].  Has f/u pending.    His daughter is living mostly with him now, she is 66.  Discussed.  Has tinea on the B feet, he has to wear steel toe boots at work.    Meds, vitals, and allergies reviewed.   ROS: Per HPI unless specifically indicated in ROS section   GEN: nad, alert and oriented HEENT: ncat NECK: supple w/o LA CV: rrr. PULM: ctab, no inc wob ABD: soft, +bs EXT: no edema SKIN: no acute rash  Diabetic foot exam: Normal inspection except for mild tinea changes.   No skin breakdown No calluses  Normal DP pulses Normal sensation to light touch and monofilament Nails normal

## 2020-02-15 NOTE — Assessment & Plan Note (Signed)
With incidentally noted tinea on the feet. A1c improved.  He can use ketoconazole on his feet.  Discussed. We'll work on sending in a new meter.  Plan on recheck at a physical with labs ahead of time in 6 months.  Recheck sooner if needed. Continue work on diet and exercise and weight.  No change in glimepiride and Metformin at this point.

## 2020-02-22 ENCOUNTER — Encounter: Payer: Self-pay | Admitting: Family Medicine

## 2020-02-22 DIAGNOSIS — E119 Type 2 diabetes mellitus without complications: Secondary | ICD-10-CM

## 2020-02-22 MED ORDER — MICROLET NEXT LANCING DEVICE MISC: 0 refills | Status: DC

## 2020-02-22 MED ORDER — CONTOUR NEXT TEST VI STRP: ORAL_STRIP | 11 refills | Status: DC

## 2020-02-22 MED ORDER — MICROLET LANCETS MISC: 11 refills | Status: DC

## 2020-02-22 MED ORDER — CONTOUR NEXT MONITOR W/DEVICE KIT: PACK | 0 refills | Status: DC

## 2020-02-22 MED ORDER — CONTOUR NEXT CONTROL NORMAL VI SOLN: 3 refills | Status: DC

## 2020-02-22 MED ORDER — FREESTYLE LANCETS MISC: 11 refills | Status: DC

## 2020-02-22 NOTE — Addendum Note (Signed)
Addended by: Carter Kitten on: 02/22/2020 03:57 PM   Modules accepted: Orders

## 2020-03-20 ENCOUNTER — Encounter: Payer: Self-pay | Admitting: Family Medicine

## 2020-04-04 ENCOUNTER — Other Ambulatory Visit: Payer: Self-pay | Admitting: Family Medicine

## 2020-06-19 ENCOUNTER — Ambulatory Visit: Payer: 59

## 2020-06-20 ENCOUNTER — Other Ambulatory Visit: Payer: Self-pay

## 2020-06-20 ENCOUNTER — Ambulatory Visit (INDEPENDENT_AMBULATORY_CARE_PROVIDER_SITE_OTHER): Payer: BC Managed Care – PPO

## 2020-06-20 DIAGNOSIS — Z23 Encounter for immunization: Secondary | ICD-10-CM

## 2020-07-10 ENCOUNTER — Other Ambulatory Visit: Payer: Self-pay | Admitting: Family Medicine

## 2020-08-05 ENCOUNTER — Other Ambulatory Visit: Payer: Self-pay | Admitting: Family Medicine

## 2020-08-05 DIAGNOSIS — E119 Type 2 diabetes mellitus without complications: Secondary | ICD-10-CM

## 2020-08-14 ENCOUNTER — Other Ambulatory Visit: Payer: Self-pay

## 2020-08-14 ENCOUNTER — Other Ambulatory Visit (INDEPENDENT_AMBULATORY_CARE_PROVIDER_SITE_OTHER): Payer: BC Managed Care – PPO

## 2020-08-14 DIAGNOSIS — E119 Type 2 diabetes mellitus without complications: Secondary | ICD-10-CM

## 2020-08-14 LAB — COMPREHENSIVE METABOLIC PANEL
ALT: 26 U/L (ref 0–53)
AST: 18 U/L (ref 0–37)
Albumin: 3.8 g/dL (ref 3.5–5.2)
Alkaline Phosphatase: 62 U/L (ref 39–117)
BUN: 10 mg/dL (ref 6–23)
CO2: 32 mEq/L (ref 19–32)
Calcium: 9.1 mg/dL (ref 8.4–10.5)
Chloride: 101 mEq/L (ref 96–112)
Creatinine, Ser: 0.85 mg/dL (ref 0.40–1.50)
GFR: 105.14 mL/min (ref >60.00)
Glucose, Bld: 120 mg/dL — ABNORMAL HIGH (ref 70–99)
Potassium: 4.5 mEq/L (ref 3.5–5.1)
Sodium: 139 mEq/L (ref 135–145)
Total Bilirubin: 0.6 mg/dL (ref 0.2–1.2)
Total Protein: 6.8 g/dL (ref 6.0–8.3)

## 2020-08-14 LAB — LIPID PANEL
Cholesterol: 160 mg/dL (ref 0–200)
HDL: 29.3 mg/dL — ABNORMAL LOW (ref >39.00)
LDL Cholesterol: 106 mg/dL — ABNORMAL HIGH (ref 0–99)
NonHDL: 130.64
Total CHOL/HDL Ratio: 5
Triglycerides: 122 mg/dL (ref 0.0–149.0)
VLDL: 24.4 mg/dL (ref 0.0–40.0)

## 2020-08-14 LAB — MICROALBUMIN / CREATININE URINE RATIO
Creatinine,U: 113.4 mg/dL
Microalb Creat Ratio: 0.8 mg/g (ref 0.0–30.0)
Microalb, Ur: 0.9 mg/dL (ref 0.0–1.9)

## 2020-08-14 LAB — HEMOGLOBIN A1C: Hgb A1c MFr Bld: 7.6 % — ABNORMAL HIGH (ref 4.6–6.5)

## 2020-08-14 NOTE — Addendum Note (Signed)
Addended by: Cloyd Stagers on: 08/14/2020 09:57 AM   Modules accepted: Orders

## 2020-08-17 ENCOUNTER — Encounter: Payer: 59 | Admitting: Family Medicine

## 2020-08-24 LAB — HM DIABETES EYE EXAM

## 2020-08-31 ENCOUNTER — Other Ambulatory Visit: Payer: Self-pay | Admitting: Family Medicine

## 2020-08-31 ENCOUNTER — Encounter: Payer: Self-pay | Admitting: Family Medicine

## 2020-08-31 ENCOUNTER — Ambulatory Visit (INDEPENDENT_AMBULATORY_CARE_PROVIDER_SITE_OTHER): Payer: BC Managed Care – PPO | Admitting: Family Medicine

## 2020-08-31 ENCOUNTER — Other Ambulatory Visit: Payer: Self-pay

## 2020-08-31 VITALS — BP 128/80 | HR 74 | Temp 97.8°F | Ht 70.0 in | Wt 305.0 lb

## 2020-08-31 DIAGNOSIS — Z0000 Encounter for general adult medical examination without abnormal findings: Secondary | ICD-10-CM | POA: Diagnosis not present

## 2020-08-31 DIAGNOSIS — E119 Type 2 diabetes mellitus without complications: Secondary | ICD-10-CM

## 2020-08-31 DIAGNOSIS — M549 Dorsalgia, unspecified: Secondary | ICD-10-CM

## 2020-08-31 DIAGNOSIS — Z7189 Other specified counseling: Secondary | ICD-10-CM

## 2020-08-31 MED ORDER — CYCLOBENZAPRINE HCL 10 MG PO TABS: 10.0000 mg | ORAL_TABLET | Freq: Three times a day (TID) | ORAL | 1 refills | Status: DC | PRN

## 2020-08-31 MED ORDER — GLIMEPIRIDE 2 MG PO TABS: 2.0000 mg | ORAL_TABLET | Freq: Every day | ORAL | 3 refills | Status: DC

## 2020-08-31 MED ORDER — PRAVASTATIN SODIUM 20 MG PO TABS: 20.0000 mg | ORAL_TABLET | Freq: Every day | ORAL | 3 refills | Status: DC

## 2020-08-31 NOTE — Patient Instructions (Addendum)
Plan on recheck in about 6 months with A1c at the visit.  Take care.  Glad to see you. Use flexeril as needed in the meantime, sedation caution.  Start taking pravastatin.  If you have aches then cut back to 1/2 tab a day.  If still not tolerated then let me know.

## 2020-08-31 NOTE — Progress Notes (Signed)
This visit occurred during the SARS-CoV-2 public health emergency.  Safety protocols were in place, including screening questions prior to the visit, additional usage of staff PPE, and extensive cleaning of exam room while observing appropriate contact time as indicated for disinfecting solutions.  CPE- See plan.  Routine anticipatory guidance given to patient.  See health maintenance.  The possibility exists that previously documented standard health maintenance information may have been brought forward from a previous encounter into this note.  If needed, that same information has been updated to reflect the current situation based on today's encounter.    Tetanus 2018 Flu 2022 PNA 2019 shingles not due covid vaccine 2021 Colon cancer screening and PSA not due.  Living will d/w pt. Mother designated if patient were incapacitated.  Diet and exercise d/w pt.  D/w pt about recent labs. We talked about HCV screening and he can consider.    He is working nights.  Recent change.  This affected his sugar and med dosing.  Discussed.  Diabetes:  Using medications without difficulties: yes but see above.   Hypoglycemic episodes: lowest was down to 90.  Cautions d/w pt.   Hyperglycemic episodes:no Feet problems: no Blood Sugars averaging: ~120 eye exam within last year: yes He cut out fast food.  D/w pt.   Statin indication d/w pt.    R lower back pain since picking his dad up after his dad fell, about 1 month ago.  He is putting up with pain.  More pain with prolonged sitting.  No weakness.  PMH and SH reviewed  Meds, vitals, and allergies reviewed.   ROS: Per HPI.  Unless specifically indicated otherwise in HPI, the patient denies:  General: fever. Eyes: acute vision changes ENT: sore throat Cardiovascular: chest pain Respiratory: SOB GI: vomiting GU: dysuria Musculoskeletal: acute back pain Derm: acute rash Neuro: acute motor dysfunction Psych: worsening mood Endocrine:  polydipsia Heme: bleeding Allergy: hayfever  GEN: nad, alert and oriented HEENT: ncat NECK: supple w/o LA CV: rrr. PULM: ctab, no inc wob ABD: soft, +bs EXT: no edema SKIN: no acute rash Spasm noted in R lower back.  Midline and left lower back not tender to palpation.  Able to bear weight.  Strength and sensation grossly intact in lower extremities.  Diabetic foot exam: Normal inspection No skin breakdown No calluses  Normal DP pulses Normal sensation to light touch and monofilament Nails normal  The 10-year ASCVD risk score Mikey Bussing DC Jr., et al., 2013) is: 5.1%   Values used to calculate the score:     Age: 45 years     Sex: Male     Is Non-Hispanic African American: No     Diabetic: Yes     Tobacco smoker: No     Systolic Blood Pressure: 397 mmHg     Is BP treated: No     HDL Cholesterol: 29.3 mg/dL     Total Cholesterol: 160 mg/dL

## 2020-09-02 NOTE — Assessment & Plan Note (Signed)
Tetanus 2018 Flu 2022 PNA 2019 shingles not due covid vaccine 2021 Colon cancer screening and PSA not due.  Living will d/w pt. Mother designated if patient were incapacitated.  Diet and exercise d/w pt.  D/w pt about recent labs. We talked about HCV screening and he can consider.

## 2020-09-02 NOTE — Assessment & Plan Note (Signed)
He cut out fast food.  D/w pt.   Statin indication d/w pt.   Start pravastatin with routine cautions.  Update me if not tolerated.  Plan on recheck periodically.  See after visit summary.  Hopefully getting back on day shift will improve his situation with diet/exercise/med administration.  Continue Metformin and glimepiride in the meantime.

## 2020-09-02 NOTE — Assessment & Plan Note (Signed)
Living will d/w pt. Mother designated if patient were incapacitated.  

## 2020-09-02 NOTE — Assessment & Plan Note (Signed)
He can try Flexeril the meantime and update me as needed.  He agrees.

## 2020-09-14 ENCOUNTER — Telehealth: Payer: Self-pay

## 2020-09-14 NOTE — Telephone Encounter (Signed)
Still not able to reach pt by phone.

## 2020-09-14 NOTE — Telephone Encounter (Signed)
Unable to reach pt on phone x 3 and left v/m for pt with info from Dr Damita Dunnings. In v/m per DPR asked pt to call [facility] and let us know if pt went to UC and if so which one. Sending note to Sherrilee Gilles CMA.

## 2020-09-14 NOTE — Telephone Encounter (Signed)
[facility] - Client TELEPHONE ADVICE RECORD [facility] Patient Name: Kyle Murphy [place] Gender: Male DOB: [birth year] Age: 45 Y 3 M 30 D Return Phone Number: [phone] (Primary) Address: City/ State/ Zip: [place] [address] Client [facility] - Client Client Site [place] Physician Renford Dills - MD Contact Type Call Who Is Calling Patient / Member / Family / Caregiver Call Type Triage / Clinical Relationship To Patient Self Return Phone Number [phone] (Primary) Chief Complaint Croup Reason for Call Symptomatic / Request for Health Information Initial Comment Transferred from office, PT has a bad cough, gaging because he is coughing so hard that started last night. Translation No Nurse Assessment Nurse: Raphael Gibney, RN, Vanita Ingles Date/Time (Eastern Time): 09/14/2020 9:47:43 AM Confirm and document reason for call. If symptomatic, describe symptoms. ---Caller states he has a cough that started last night. No fever. Coughing so hard he is gagging. no body aches or headache. has nasal congestion Does the patient have any new or worsening symptoms? ---Yes Will a triage be completed? ---Yes Related visit to physician within the last 2 weeks? ---No Does the PT have any chronic conditions? (i.e. diabetes, asthma, this includes High risk factors for pregnancy, etc.) ---Yes List chronic conditions. ---diabetes Is this a behavioral health or substance abuse call? ---No Guidelines Guideline Title Affirmed Question Affirmed Notes Nurse Date/Time ([place] Time) COVID-19 - Diagnosed or Suspected [1] Continuous (nonstop) coughing interferes with work or school AND [2] no improvement using cough treatment per Care Advice Raphael Gibney, RN, Vanita Ingles 09/14/2020 9:49:29 AM Disp. Time Eilene Ghazi Time) Disposition Final User PLEASE NOTE: All timestamps contained within this report are represented as  [place] Standard Time. CONFIDENTIALTY NOTICE: This fax transmission is intended only for the addressee. It contains information that is legally privileged, confidential or otherwise protected from use or disclosure. If you are not the intended recipient, you are strictly prohibited from reviewing, disclosing, copying using or disseminating any of this information or taking any action in reliance on or regarding this information. If you have received this fax in error, please notify us immediately by telephone so that we can arrange for its return to [place]. Phone: [phone], Toll-Free: [phone], Fax: [phone] Page: 2 of 2 Call Id: [id number] 09/14/2020 9:55:16 AM Call PCP within 24 Hours Yes Raphael Gibney, RN, Doreatha Lew Disagree/Comply Comply Caller Understands Yes PreDisposition Call Doctor Care Advice Given Per Guideline CALL PCP WITHIN 24 HOURS: * You need to discuss this with your doctor (or NP/PA) within the next 24 hours. GENERAL CARE ADVICE FOR COVID-19 SYMPTOMS: * The symptoms are generally treated the same whether you have COVID-19, influenza or some other respiratory virus. COUGH MEDICINES: * COUGH DROPS: Over-the-counter cough drops can help a lot, especially for mild coughs. They soothe an irritated throat and remove the tickle sensation in the back of the throat. Cough drops are easy to carry with you. CALL BACK IF: * Chest pain or difficulty breathing occurs * Fever over 103 F (39.4 C) * You become worse CARE ADVICE given per COVID-19 - DIAGNOSED OR SUSPECTED (Adult) guideline. Comments User: Dannielle Burn, RN Date/Time Eilene Ghazi Time): 09/14/2020 9:55:09 AM called office and there are no appts available for today. advised pt to go to urgent care. States he will. Referrals REFERRED TO PCP OFFICE GO TO FACILITY UNDECIDED

## 2020-09-14 NOTE — Telephone Encounter (Signed)
Would get face to face visit scheduled/done when possible.  Thanks.

## 2020-09-16 NOTE — Telephone Encounter (Signed)
Please see if you can get update on patient on 09/17/2020.  Thanks.

## 2020-09-17 NOTE — Telephone Encounter (Signed)
Spoke with patient and he is doing much better. No sx currently.

## 2020-09-22 ENCOUNTER — Emergency Department: Payer: BC Managed Care – PPO

## 2020-09-22 ENCOUNTER — Emergency Department
Admission: EM | Admit: 2020-09-22 | Discharge: 2020-09-22 | Disposition: A | Discharge status: Home / Self Care | Payer: BC Managed Care – PPO | Attending: Emergency Medicine | Admitting: Emergency Medicine

## 2020-09-22 ENCOUNTER — Other Ambulatory Visit: Payer: Self-pay

## 2020-09-22 DIAGNOSIS — E119 Type 2 diabetes mellitus without complications: Secondary | ICD-10-CM | POA: Diagnosis not present

## 2020-09-22 DIAGNOSIS — Z7984 Long term (current) use of oral hypoglycemic drugs: Secondary | ICD-10-CM | POA: Insufficient documentation

## 2020-09-22 DIAGNOSIS — J40 Bronchitis, not specified as acute or chronic: Secondary | ICD-10-CM | POA: Diagnosis not present

## 2020-09-22 DIAGNOSIS — R059 Cough, unspecified: Secondary | ICD-10-CM | POA: Diagnosis not present

## 2020-09-22 DIAGNOSIS — M62830 Muscle spasm of back: Secondary | ICD-10-CM | POA: Diagnosis not present

## 2020-09-22 DIAGNOSIS — J209 Acute bronchitis, unspecified: Secondary | ICD-10-CM | POA: Insufficient documentation

## 2020-09-22 MED ORDER — AZITHROMYCIN 250 MG PO TABS: ORAL_TABLET | ORAL | 0 refills | Status: DC

## 2020-09-22 MED ORDER — BENZONATATE 100 MG PO CAPS: 100.0000 mg | ORAL_CAPSULE | Freq: Three times a day (TID) | ORAL | 0 refills | Status: DC | PRN

## 2020-09-22 MED ORDER — BACLOFEN 10 MG PO TABS: 10.0000 mg | ORAL_TABLET | Freq: Three times a day (TID) | ORAL | 0 refills | Status: AC

## 2020-09-22 MED ORDER — METHYLPREDNISOLONE 4 MG PO TBPK: ORAL_TABLET | ORAL | 0 refills | Status: DC

## 2020-09-22 NOTE — ED Triage Notes (Signed)
Pt comes pov with a cough off and on for 2 weeks and lower back pain. States hx of bulging discs.

## 2020-09-22 NOTE — ED Provider Notes (Signed)
[facility] Emergency Department Provider Note  ____________________________________________   Event Date/Time   First MD Initiated Contact with Patient 09/22/20 1405     (approximate)  I have reviewed the triage vital signs and the nursing notes.   HISTORY  Chief Complaint Cough and Back Pain    HPI Kyle Murphy is a 45 y.o. male presents emergency department complaining of a cough on and off for about 2 weeks.  Screening lower back pain in which she normally needs steroids and muscle relaxers.  States minimal shortness of breath.  Cough is nonproductive but he does feel like he needs to vomit with cough.  He denies chest pain.  No fever or chills.  No known exposure to COVID.    Past Medical History:  Diagnosis Date  . Anxiety   . Diabetes ([place])   . History of heartburn   . HLD (hyperlipidemia)   . Leg cramps    history of  . Migraines    with photo and phonophobia  . Multiple thyroid nodules   . Plantar fasciitis    L foot, injected at foot center 2014  . Salmonella enteritis 2011   stool culture positive    Patient Active Problem List   Diagnosis Date Noted  . Metatarsalgia 05/04/2019  . Rash 01/30/2019  . Sciatica of right side 11/07/2018  . Lower urinary tract symptoms (LUTS) 07/30/2018  . Allergy 07/30/2018  . Lip swelling 04/20/2018  . Skin lesion 01/27/2018  . Hematuria 07/19/2017  . Fatty liver 07/19/2017  . Anxiety 07/26/2016  . Diabetes mellitus without complication ([place]) 46/80/3212  . Paresthesia 03/30/2016  . Advance care planning 08/09/2014  . GERD (gastroesophageal reflux disease) 08/09/2014  . Cough 11/28/2013  . Plantar fasciitis 09/23/2013  . Routine general medical examination at a health care facility 04/01/2011  . Back pain 04/01/2011  . Hyperlipidemia 03/28/2010  . Multinodular goiter 03/28/2010    Past Surgical History:  Procedure Laterality Date  . CHOLECYSTECTOMY    . TONSILLECTOMY      Prior to  Admission medications   Medication Sig Start Date End Date Taking? Authorizing Provider  azithromycin (ZITHROMAX Z-PAK) 250 MG tablet 2 pills today then 1 pill a day for 4 days 09/22/20  Yes Math Brazie, Linden Dolin, PA-C  baclofen (LIORESAL) 10 MG tablet Take 1 tablet (10 mg total) by mouth 3 (three) times daily for 7 days. 09/22/20 09/29/20 Yes Aniya Jolicoeur, Linden Dolin, PA-C  benzonatate (TESSALON PERLES) 100 MG capsule Take 1 capsule (100 mg total) by mouth 3 (three) times daily as needed for cough. 09/22/20 09/22/21 Yes Wandy Bossler, Linden Dolin, PA-C  methylPREDNISolone (MEDROL DOSEPAK) 4 MG TBPK tablet Take 6 pills on day one then decrease by 1 pill each day 09/22/20  Yes Reace Breshears, Linden Dolin, PA-C  Blood Glucose Calibration (CONTOUR NEXT CONTROL) Normal SOLN Use to check controls on glucose meter. 02/22/20   Tonia Ghent, MD  blood glucose meter kit and supplies KIT Dispense based on patient and insurance preference. Use up to four times daily as directed. (FOR ICD-10  E11.9).  Non insulin dependent. 02/14/20   Tonia Ghent, MD  Blood Glucose Monitoring Suppl (CONTOUR NEXT MONITOR) w/Device KIT Use to check blood sugar up to four times daily as directed. (FOR ICD-10 E11.9). Non insulin dependent. 02/22/20   Tonia Ghent, MD  cyclobenzaprine (FLEXERIL) 10 MG tablet Take 1 tablet (10 mg total) by mouth 3 (three) times daily as needed for muscle spasms (sedation caution). 08/31/20  Tonia Ghent, MD  diclofenac (VOLTAREN) 75 MG EC tablet Take 1 tablet (75 mg total) by mouth 2 (two) times daily. 06/24/19   Edrick Kins, DPM  EPINEPHrine 0.3 mg/0.3 mL IJ SOAJ injection Inject 0.3 mg into the muscle as needed for anaphylaxis.    [provider]  glimepiride (AMARYL) 2 MG tablet TAKE 1 TABLET BY MOUTH  DAILY BEFORE BREAKFAST 09/03/20   Tonia Ghent, MD  glucose blood (CONTOUR NEXT TEST) test strip Use to check blood sugar up to four times daily as directed. (FOR ICD-10 E11.9). Non insulin dependent. 02/22/20   Tonia Ghent, MD  ketoconazole (NIZORAL) 2 % cream Apply 1 application topically daily. 02/14/20   Tonia Ghent, MD  Lancet Devices (MICROLET NEXT LANCING DEVICE) MISC Use to check blood sugar up to four times daily as directed. (FOR ICD-10 E11.9). Non insulin dependent 02/22/20   Tonia Ghent, MD  loratadine (CLARITIN) 10 MG tablet Take 1 tablet (10 mg total) by mouth daily as needed for allergies. 04/19/18   Tonia Ghent, MD  metFORMIN (GLUCOPHAGE) 500 MG tablet TAKE 1 TO 2 TABLETS BY  MOUTH TWICE DAILY WITH  MEALS 07/11/20   Tonia Ghent, MD  pantoprazole (PROTONIX) 20 MG tablet TAKE 1 TABLET BY MOUTH  DAILY 04/05/20   Tonia Ghent, MD  pravastatin (PRAVACHOL) 20 MG tablet Take 1 tablet (20 mg total) by mouth daily. 08/31/20   Tonia Ghent, MD    Allergies Ace inhibitors, Angiotensin receptor blockers, and Penicillins  Family History  Problem Relation Age of Onset  . Hypertension Mother   . Depression Mother        bipolar  . Diabetes Mother        diet controlled  . COPD Mother   . Thyroid disease Mother   . Diabetes Father   . Thyroid disease Father   . Heart disease Father   . Thyroid disease Brother   . Hypertension Brother   . Diabetes Brother   . Colon cancer Neg Hx   . Prostate cancer Neg Hx     Social History Social History   Tobacco Use  . Smoking status: Never Smoker  . Smokeless tobacco: Never Used  Vaping Use  . Vaping Use: Never used  Substance Use Topics  . Alcohol use: No    Alcohol/week: 0.0 standard drinks    Comment: occassionally  . Drug use: No    Review of Systems  Constitutional: No fever/chills Eyes: No visual changes. ENT: No sore throat. Respiratory: Positive cough Cardiovascular: Denies chest pain Gastrointestinal: Denies abdominal pain Genitourinary: Negative for dysuria. Musculoskeletal: Negative for back pain. Skin: Negative for rash. Psychiatric: no mood changes,      ____________________________________________   PHYSICAL EXAM:  VITAL SIGNS: ED Triage Vitals  Enc Vitals Group     BP 09/22/20 1401 (!) 141/87     Pulse Rate 09/22/20 1401 83     Resp 09/22/20 1401 18     Temp 09/22/20 1401 98.5 F (36.9 C)     Temp Source 09/22/20 1401 Oral     SpO2 09/22/20 1401 97 %     Weight 09/22/20 1402 (!) 305 lb (138.3 kg)     Height 09/22/20 1402 5' 10"  (1.778 m)     Head Circumference --      Peak Flow --      Pain Score 09/22/20 1401 6     Pain Loc --  Pain Edu? --      Excl. in [place]? --     Constitutional: Alert and oriented. Well appearing and in no acute distress. Eyes: Conjunctivae are normal.  Head: Atraumatic. Nose: No congestion/rhinnorhea. Mouth/Throat: Mucous membranes are moist.   Neck:  supple no lymphadenopathy noted Cardiovascular: Normal rate, regular rhythm. Heart sounds are normal Respiratory: Normal respiratory effort.  No retractions, lungs c t a, cough is dry and hacking GU: deferred Musculoskeletal: FROM all extremities, warm and well perfused, patient is able to walk without difficulty Neurologic:  Normal speech and language.  Skin:  Skin is warm, dry and intact. No rash noted. Psychiatric: Mood and affect are normal. Speech and behavior are normal.  ____________________________________________   LABS (all labs ordered are listed, but only abnormal results are displayed)  Labs Reviewed - No data to display ____________________________________________   ____________________________________________  RADIOLOGY  Chest x-ray  ____________________________________________   PROCEDURES  Procedure(s) performed: No  Procedures    ____________________________________________   INITIAL IMPRESSION / ASSESSMENT AND PLAN / ED COURSE  Pertinent labs & imaging results that were available during my care of the patient were reviewed by me and considered in my medical decision making (see chart for  details).   Patient is a 45 year old male presents with URI symptoms.  See HPI.  Physical exam shows patient appears well.  At this time plan is to get a chest x-ray to rule out pneumonia.  Patient will be hopefully discharged with antibiotic and steroids along with muscle relaxer for his lower back spasms.   Chest x-ray reviewed by me confirmed by radiology to be normal  I did explain the findings to the patient.  He was given a prescription for Z-Pak, Tessalon Perles, Medrol Dosepak, and baclofen for muscle spasms.  He is to follow-up with his regular doctor if not improving in 2 to 3 days.  Return emergency department worsening.  He was discharged in stable condition.  Kyle Murphy was evaluated in Emergency Department on 09/22/2020 for the symptoms described in the history of present illness. He was evaluated in the context of the global COVID-19 pandemic, which necessitated consideration that the patient might be at risk for infection with the SARS-CoV-2 virus that causes COVID-19. Institutional protocols and algorithms that pertain to the evaluation of patients at risk for COVID-19 are in a state of rapid change based on information released by regulatory bodies including the CDC and federal and state organizations. These policies and algorithms were followed during the patient's care in the ED.    As part of my medical decision making, I reviewed the following data within the [facility] notes reviewed and incorporated, Old chart reviewed, Radiograph reviewed , Notes from prior ED visits and [place] Controlled Substance Database  ____________________________________________   FINAL CLINICAL IMPRESSION(S) / ED DIAGNOSES  Final diagnoses:  Acute bronchitis, unspecified organism      NEW MEDICATIONS STARTED DURING THIS VISIT:  Discharge Medication List as of 09/22/2020  2:45 PM    START taking these medications   Details  azithromycin (ZITHROMAX Z-PAK) 250 MG  tablet 2 pills today then 1 pill a day for 4 days, Normal    baclofen (LIORESAL) 10 MG tablet Take 1 tablet (10 mg total) by mouth 3 (three) times daily for 7 days., Starting Sat 09/22/2020, Until Sat 09/29/2020, Normal    benzonatate (TESSALON PERLES) 100 MG capsule Take 1 capsule (100 mg total) by mouth 3 (three) times daily as needed for cough.,  Starting Sat 09/22/2020, Until Sun 09/22/2021 at 2359, Normal    methylPREDNISolone (MEDROL DOSEPAK) 4 MG TBPK tablet Take 6 pills on day one then decrease by 1 pill each day, Normal         Note:  This document was prepared using Dragon voice recognition software and may include unintentional dictation errors.    Versie Starks, PA-C 09/22/20 1705    Naaman Plummer, MD 09/23/20 [phone]

## 2020-10-15 ENCOUNTER — Encounter: Payer: Self-pay | Admitting: Family Medicine

## 2020-10-15 ENCOUNTER — Ambulatory Visit: Payer: BC Managed Care – PPO | Admitting: Family Medicine

## 2020-10-15 ENCOUNTER — Other Ambulatory Visit: Payer: Self-pay

## 2020-10-15 VITALS — BP 122/70 | HR 76 | Temp 98.1°F | Ht 70.0 in | Wt 317.0 lb

## 2020-10-15 DIAGNOSIS — Z20822 Contact with and (suspected) exposure to covid-19: Secondary | ICD-10-CM

## 2020-10-15 DIAGNOSIS — R059 Cough, unspecified: Secondary | ICD-10-CM | POA: Diagnosis not present

## 2020-10-15 MED ORDER — BENZONATATE 100 MG PO CAPS: 100.0000 mg | ORAL_CAPSULE | Freq: Three times a day (TID) | ORAL | 0 refills | Status: DC | PRN

## 2020-10-15 NOTE — Patient Instructions (Signed)
Will test for Covid - if positive I will send in Paxlovid to take for treatment.   If positive you can end isolation and wear a mask 100% of the time starting 10/17/2020.   If negative - continue symptomatic care.    Based on your symptoms, it looks like you have a virus.   Antibiotics are not need for a viral infection but the following will help:   1. Drink plenty of fluids 2. Get lots of rest  Sinus Congestion 1) Neti Pot (Saline rinse) -- 2 times day -- if tolerated 2) Flonase (Store Brand ok) - once daily 3) Over the counter congestion medications  Cough 1) Cough drops can be helpful 2) Nyquil (or nighttime cough medication) 3) Honey is proven to be one of the best cough medications  4) Cough medicine with Dextromethorphan can also be helpful  Sore Throat 1) Honey as above, cough drops 2) Ibuprofen or Aleve can be helpful 3) Salt water Gargles  If you develop fevers (Temperature >100.4), chills, worsening symptoms or symptoms lasting longer than 10 days return to clinic.

## 2020-10-15 NOTE — Progress Notes (Signed)
Subjective:     Kyle Murphy is a 45 y.o. male presenting for Cough (F/u from ED for bronchitis)     HPI   #Cough - started on 10/11/2020 - no known exposure to covid - did not get tested for cough - did recover from bronchitis last month - has a chest congestion - no ear pain - endorses back pain again  - does not take daily allergy pill - taking nyquil and rest for 2 days and nights   Review of Systems  Constitutional: Negative for chills and fever.  HENT: Positive for congestion. Negative for postnasal drip, rhinorrhea, sinus pressure and sinus pain.   Eyes: Negative for itching.  Respiratory: Positive for cough. Negative for shortness of breath.   Gastrointestinal: Positive for diarrhea. Negative for constipation, nausea and vomiting.  Musculoskeletal: Negative for arthralgias and myalgias.  Allergic/Immunologic: Positive for environmental allergies.     Social History   Tobacco Use  Smoking Status Never Smoker  Smokeless Tobacco Never Used        Objective:    BP Readings from Last 3 Encounters:  10/15/20 122/70  09/22/20 (!) 132/93  08/31/20 128/80   Wt Readings from Last 3 Encounters:  10/15/20 (!) 317 lb (143.8 kg)  09/22/20 (!) 305 lb (138.3 kg)  08/31/20 (!) 305 lb (138.3 kg)    BP 122/70   Pulse 76   Temp 98.1 F (36.7 C) (Temporal)   Ht 5\' 10"  (1.778 m)   Wt (!) 317 lb (143.8 kg)   SpO2 97%   BMI 45.48 kg/m    Physical Exam Constitutional:      General: He is not in acute distress.    Appearance: He is well-developed. He is not ill-appearing.  HENT:     Head: Normocephalic and atraumatic.     Right Ear: Tympanic membrane and ear canal normal.     Left Ear: Tympanic membrane and ear canal normal.     Nose: Mucosal edema and rhinorrhea present.     Right Sinus: No maxillary sinus tenderness or frontal sinus tenderness.     Left Sinus: No maxillary sinus tenderness or frontal sinus tenderness.     Mouth/Throat:     Pharynx:  Uvula midline. No oropharyngeal exudate or posterior oropharyngeal erythema.     Tonsils: 0 on the right. 0 on the left.  Cardiovascular:     Rate and Rhythm: Normal rate and regular rhythm.     Heart sounds: No murmur heard.   Pulmonary:     Effort: Pulmonary effort is normal. No respiratory distress.     Breath sounds: Normal breath sounds.  Musculoskeletal:     Cervical back: Neck supple.  Lymphadenopathy:     Cervical: No cervical adenopathy.  Skin:    General: Skin is warm and dry.     Capillary Refill: Capillary refill takes less than 2 seconds.  Neurological:     Mental Status: He is alert.           Assessment & Plan:   Problem List Items Addressed This Visit      Other   Cough - Primary   Relevant Medications   benzonatate (TESSALON PERLES) 100 MG capsule   Other Relevant Orders   SAR CoV2 Serology (COVID 19)AB(IGG)IA    Other Visit Diagnoses    Suspected COVID-19 virus infection       Relevant Medications   benzonatate (TESSALON PERLES) 100 MG capsule   Other Relevant Orders   SAR CoV2  Serology (COVID 19)AB(IGG)IA     Covid testing today Advised isolation Candidate for Paxlovid if positive Tessalon perles for symptom control  ER precautions  Return if symptoms worsen or fail to improve.  Lesleigh Noe, MD  This visit occurred during the SARS-CoV-2 public health emergency.  Safety protocols were in place, including screening questions prior to the visit, additional usage of staff PPE, and extensive cleaning of exam room while observing appropriate contact time as indicated for disinfecting solutions.

## 2020-10-22 ENCOUNTER — Encounter: Payer: Self-pay | Admitting: Family Medicine

## 2020-11-11 DIAGNOSIS — Z20822 Contact with and (suspected) exposure to covid-19: Secondary | ICD-10-CM | POA: Diagnosis not present

## 2020-12-09 ENCOUNTER — Encounter: Payer: Self-pay | Admitting: Family Medicine

## 2020-12-12 ENCOUNTER — Other Ambulatory Visit: Payer: Self-pay | Admitting: Family Medicine

## 2020-12-12 MED ORDER — PRAVASTATIN SODIUM 20 MG PO TABS: ORAL_TABLET | ORAL | 3 refills | Status: DC

## 2021-01-23 ENCOUNTER — Other Ambulatory Visit: Payer: Self-pay | Admitting: Family Medicine

## 2021-02-04 ENCOUNTER — Other Ambulatory Visit: Payer: Self-pay

## 2021-02-04 ENCOUNTER — Ambulatory Visit: Payer: BC Managed Care – PPO | Admitting: Family Medicine

## 2021-02-04 ENCOUNTER — Encounter: Payer: Self-pay | Admitting: Family Medicine

## 2021-02-04 VITALS — BP 144/80 | HR 80 | Temp 98.7°F | Ht 70.0 in | Wt 313.0 lb

## 2021-02-04 DIAGNOSIS — M5416 Radiculopathy, lumbar region: Secondary | ICD-10-CM | POA: Diagnosis not present

## 2021-02-04 DIAGNOSIS — E119 Type 2 diabetes mellitus without complications: Secondary | ICD-10-CM

## 2021-02-04 MED ORDER — PREDNISONE 20 MG PO TABS: ORAL_TABLET | ORAL | 0 refills | Status: DC

## 2021-02-04 NOTE — Progress Notes (Signed)
[employer] T. Mabeline Varas, MD, [place] at [facility] [place], [address]  Phone: [phone]  FAX: [phone]  Ceylon Arenson - 45 y.o. male  MRN [id number]  Date of Birth: 03-Jun-1976  Date: 02/04/2021  PCP: Tonia Ghent, MD  Referral: Tonia Ghent, MD  Chief Complaint  Patient presents with  . Back Pain    This visit occurred during the SARS-CoV-2 public health emergency.  Safety protocols were in place, including screening questions prior to the visit, additional usage of staff PPE, and extensive cleaning of exam room while observing appropriate contact time as indicated for disinfecting solutions.   Subjective:   Prynce Jacober is a 45 y.o. very pleasant male patient with Body mass index is 44.91 kg/m. who presents with the following:  Back pain with prior known history of back pain.  MRI essentially normal with minimal concerning features.   Historically long time of back pain on the order of years.   I personally reviewed the patient's MRI of the lumbar spine from November 18, 2018.  He only has mild disc desiccation from L3-S1 without any spinal stenosis, foraminal stenosis, and he has only low level degenerative changes. Electronically Signed  By: Owens Loffler, MD On: 02/04/2021  8:40 AM EDT   R buttocks - goes all the way down his leg.   Muscle relaxers  - could not walk at work. He is having a hard time lifting.  He denies saddle anesthesia and no other global neurological complaints.  No prior surgery.  Has had some muscle relaxers and steroids in the past  Does have a chiropractor.  Lab Results  Component Value Date   HGBA1C 7.6 (H) 08/14/2020     Review of Systems is noted in the HPI, as appropriate   Objective:   BP (!) 144/80   Pulse 80   Temp 98.7 F (37.1 C) (Temporal)   Ht 5' 10" (1.778 m)   Wt (!) 313 lb (142 kg)   SpO2 97%   BMI 44.91 kg/m    Range of motion at  the  waist: Are grossly normal with mild impairment only  No echymosis or edema Rises to examination table with no difficulty Gait: non antalgic  Inspection/Deformity: N Paraspinus Tenderness: More in the buttocks on the right and to a much lesser extent L4-S1 on the right  B Ankle Dorsiflexion (L5,4): 5/5 B Great Toe Dorsiflexion (L5,4): 5/5 Heel Walk (L5): WNL Toe Walk (S1): WNL Rise/Squat (L4): WNL  SENSORY B Medial Foot (L4): WNL B Dorsum (L5): WNL B Lateral (S1): WNL Light Touch: WNL Pinprick: WNL  REFLEXES Knee (L4): 2+ Ankle (S1): 2+  B SLR, seated: neg B SLR, supine: neg B FABER: neg B Reverse FABER: neg B Greater Troch: NT B Log Roll: neg B Sciatic Notch: NT   Radiology: CLINICAL DATA:  45 year old male with insidious, progressive low back pain radiating to the right buttock and leg with weakness x2 months. No known injury.   EXAM: MRI LUMBAR SPINE WITHOUT CONTRAST   TECHNIQUE: Multiplanar, multisequence MR imaging of the lumbar spine was performed. No intravenous contrast was administered.   COMPARISON:  Lumbar radiographs 09/21/2018. CT Abdomen and Pelvis 09/02/2017.   FINDINGS: Segmentation:  Normal on the comparison Speer   Alignment: Stable mild straightening of lordosis since 2019. Stable trace retrolisthesis of L5 on S1.   Vertebrae: No marrow edema or evidence of acute osseous abnormality. Visualized bone marrow signal  is within normal limits. Intact visible sacrum and SI joints.   Conus medullaris and cauda equina: Conus extends to the L1 level. No lower spinal cord or conus signal abnormality.   Paraspinal and other soft tissues: Negative.   Disc levels:   T11-T12: Negative.   T12-L1:  Negative.   L1-L2:  Negative.   L2-L3:  Negative.   L3-L4: Mild disc desiccation. Posterior annular fissure (series 14, image 25). Minimal disc bulge. No stenosis.   L4-L5: Disc desiccation and disc space loss. Posterior annular fissure and  small central to left paracentral disc protrusion (series 14, image 31). Mild underlying disc bulging and endplate spurring. No spinal or convincing lateral recess stenosis. No foraminal stenosis.   L5-S1: Disc desiccation. Mild disc bulge and endplate spurring. Posterior annular fissure most apparent on series 6, image 7. Mild facet hypertrophy. Epidural lipomatosis. No stenosis.   IMPRESSION: Lower lumbar disc and to a lesser extent endplate degeneration. Annular fissures of the discs with mild disc bulging, and a subtle disc protrusion at L4-L5. No spinal stenosis or convincing neural impingement.     Electronically Signed   By: Genevie Ann M.D.   On: 11/18/2018 22:31  Assessment and Plan:     ICD-10-CM   1. Lumbar radiculopathy, acute  M54.16 Ambulatory referral to Physical Therapy    2. Diabetes mellitus without complication (HCC)  S49.6      Low back pain with referral to the buttocks and down the leg without global neurological impairment.  Exam is relatively benign.  Back pain with exacerbation.  Have the patient do some basic range of motion work.    He has had back pain off and on for years, and think that some formal physical therapy would help acutely, but also for the rest of his life.  He is young.  Meds ordered this encounter  Medications  . predniSONE (DELTASONE) 20 MG tablet    Sig: 2 tabs po for 7 days, then 1 tab po for 7 days    Dispense:  21 tablet    Refill:  0   Medications Discontinued During This Encounter  Medication Reason  . azithromycin (ZITHROMAX Z-PAK) 250 MG tablet Completed Course   Orders Placed This Encounter  Procedures  . Ambulatory referral to Physical Therapy    Follow-up: 6 weeks, none if better  Dragon [employer] One speech-to-text software was used for transcription in this dictation.  Possible transcriptional errors can occur using Editor, commissioning.   Signed,  Maud Deed. Copland, MD   Outpatient Encounter Medications as of  02/04/2021  Medication Sig  . predniSONE (DELTASONE) 20 MG tablet 2 tabs po for 7 days, then 1 tab po for 7 days  . benzonatate (TESSALON PERLES) 100 MG capsule Take 1 capsule (100 mg total) by mouth 3 (three) times daily as needed for cough.  . Blood Glucose Calibration (CONTOUR NEXT CONTROL) Normal SOLN Use to check controls on glucose meter.  . blood glucose meter kit and supplies KIT Dispense based on patient and insurance preference. Use up to four times daily as directed. (FOR ICD-10  E11.9).  Non insulin dependent.  . Blood Glucose Monitoring Suppl (CONTOUR NEXT MONITOR) w/Device KIT Use to check blood sugar up to four times daily as directed. (FOR ICD-10 E11.9). Non insulin dependent.  . cyclobenzaprine (FLEXERIL) 10 MG tablet Take 1 tablet (10 mg total) by mouth 3 (three) times daily as needed for muscle spasms (sedation caution).  Marland Kitchen diclofenac (VOLTAREN) 75 MG EC tablet  Take 1 tablet (75 mg total) by mouth 2 (two) times daily.  Marland Kitchen EPINEPHrine 0.3 mg/0.3 mL IJ SOAJ injection Inject 0.3 mg into the muscle as needed for anaphylaxis. (Patient not taking: Reported on 10/15/2020)  . glimepiride (AMARYL) 2 MG tablet TAKE 1 TABLET BY MOUTH  DAILY BEFORE BREAKFAST  . glucose blood (CONTOUR NEXT TEST) test strip Use to check blood sugar up to four times daily as directed. (FOR ICD-10 E11.9). Non insulin dependent.  Marland Kitchen ketoconazole (NIZORAL) 2 % cream Apply 1 application topically daily.  Elmore Guise Devices (MICROLET NEXT LANCING DEVICE) MISC Use to check blood sugar up to four times daily as directed. (FOR ICD-10 E11.9). Non insulin dependent  . loratadine (CLARITIN) 10 MG tablet Take 1 tablet (10 mg total) by mouth daily as needed for allergies.  . metFORMIN (GLUCOPHAGE) 500 MG tablet TAKE 1 TO 2 TABLETS BY  MOUTH TWICE DAILY WITH  MEALS  . pantoprazole (PROTONIX) 20 MG tablet TAKE 1 TABLET BY MOUTH  DAILY  . pravastatin (PRAVACHOL) 20 MG tablet Held 12/12/20  . [DISCONTINUED] azithromycin (ZITHROMAX  Z-PAK) 250 MG tablet 2 pills today then 1 pill a day for 4 days   No facility-administered encounter medications on file as of 02/04/2021.

## 2021-02-22 DIAGNOSIS — M2560 Stiffness of unspecified joint, not elsewhere classified: Secondary | ICD-10-CM | POA: Diagnosis not present

## 2021-02-22 DIAGNOSIS — M545 Low back pain, unspecified: Secondary | ICD-10-CM | POA: Diagnosis not present

## 2021-03-01 DIAGNOSIS — M545 Low back pain, unspecified: Secondary | ICD-10-CM | POA: Diagnosis not present

## 2021-03-01 DIAGNOSIS — M2560 Stiffness of unspecified joint, not elsewhere classified: Secondary | ICD-10-CM | POA: Diagnosis not present

## 2021-03-04 ENCOUNTER — Other Ambulatory Visit: Payer: Self-pay

## 2021-03-04 ENCOUNTER — Ambulatory Visit: Payer: BC Managed Care – PPO | Admitting: Family Medicine

## 2021-03-04 ENCOUNTER — Encounter: Payer: Self-pay | Admitting: Family Medicine

## 2021-03-04 VITALS — BP 140/80 | HR 88 | Temp 98.3°F | Ht 70.0 in | Wt 310.0 lb

## 2021-03-04 DIAGNOSIS — E119 Type 2 diabetes mellitus without complications: Secondary | ICD-10-CM

## 2021-03-04 DIAGNOSIS — M545 Low back pain, unspecified: Secondary | ICD-10-CM | POA: Diagnosis not present

## 2021-03-04 DIAGNOSIS — M5431 Sciatica, right side: Secondary | ICD-10-CM

## 2021-03-04 DIAGNOSIS — Z23 Encounter for immunization: Secondary | ICD-10-CM | POA: Diagnosis not present

## 2021-03-04 LAB — POCT GLYCOSYLATED HEMOGLOBIN (HGB A1C): Hemoglobin A1C: 9.9 % — AB (ref 4.0–5.6)

## 2021-03-04 MED ORDER — OZEMPIC (0.25 OR 0.5 MG/DOSE) 2 MG/1.5ML ~~LOC~~ SOPN: PEN_INJECTOR | SUBCUTANEOUS | 3 refills | Status: DC

## 2021-03-04 MED ORDER — GABAPENTIN 100 MG PO CAPS: 100.0000 mg | ORAL_CAPSULE | Freq: Three times a day (TID) | ORAL | 3 refills | Status: DC

## 2021-03-04 MED ORDER — DIAZEPAM 5 MG PO TABS: 2.5000 mg | ORAL_TABLET | Freq: Once | ORAL | 0 refills | Status: AC

## 2021-03-04 NOTE — Progress Notes (Signed)
This visit occurred during the SARS-CoV-2 public health emergency.  Safety protocols were in place, including screening questions prior to the visit, additional usage of staff PPE, and extensive cleaning of exam room while observing appropriate contact time as indicated for disinfecting solutions.  Diabetes:  Using medications without difficulties: yes, taking 4 metformin a day.  Taking 2 glimepiride a day recently, since sugar was up.   Hypoglycemic episodes: up to 400 recently.   Hyperglycemic episodes: no Feet problems: no Blood Sugars averaging: see above.   eye exam within last year: yes A1c 9.9.  he was not expecting his A1c to be at goal.   Statin intolerant.    He is still working swing shifts.  Discussed.  He is trying to get on a regular shift.  GI upset started with pravastatin use and got better with med cessation.  Allergy list updated.    Back pain.  Going on for several months.  B lower back pain, paraspinal are B.  Steroid taper helped prev but then pain returned.  PT didn't help.  He was doing HEP.  He has numbness in the R leg.  Pain radiates into the R leg and affects his gait.  He has trouble picking up in the R foot occ.  No FCNAVD.    Prev MR L spine: Lower lumbar disc and to a lesser extent endplate degeneration. Annular fissures of the discs with mild disc bulging, and a subtle disc protrusion at L4-L5. No spinal stenosis or convincing neural impingement.  Valium rx sent for MRI, d/w pt.    Meds, vitals, and allergies reviewed.   ROS: Per HPI unless specifically indicated in ROS section   GEN: nad, alert and oriented HEENT: ncat NECK: supple w/o LA CV: rrr. PULM: ctab, no inc wob ABD: soft, +bs EXT: no edema SKIN: no acute rash B lower back sore but not ttp.  No midline pain.  S/S wnl BLE and SLR neg at exam.    Diabetic foot exam: Normal inspection No skin breakdown No calluses  Normal DP pulses Normal sensation to light touch and  monofilament Nails normal  30 minutes were devoted to patient care in this encounter (this includes time spent reviewing the patient's file/history, interviewing and examining the patient, counseling/reviewing plan with patient).

## 2021-03-04 NOTE — Patient Instructions (Addendum)
Add on ozempic and then recheck in 3 months.  Labs at the visit.  Start with 0.25 mg once weekly for 4 weeks, then increase to 0.5 mg once weekly.    Let me know if you have trouble with the injection (abdominal pain, etc).   We'll call about getting the MRI set up.  Try gabapentin for pain in the meantime.  Sedation caution.  Take care.  Glad to see you.

## 2021-03-06 NOTE — Assessment & Plan Note (Signed)
Discussed options We'll call about getting the MRI L-spine set up.  Try gabapentin for pain in the meantime.  Sedation caution.  Routine cautions given to patient.  He agrees with plan.

## 2021-03-06 NOTE — Assessment & Plan Note (Signed)
A1c 9.9.  he was not expecting his A1c to be at goal.   Statin intolerant.  GI upset started with pravastatin use and got better with med cessation.  Allergy list updated.    He is still working swing shifts.  Discussed.  He is trying to get on a regular shift.  Add on ozempic and then recheck in 3 months.  His brother reportedly tolerated the medication with good effect.  Discussed rationale for use and routine cautions.  Labs at the visit at follow-up. Start with 0.25 mg once weekly for 4 weeks, then increase to 0.5 mg once weekly.    He will let me know if having trouble with the injection (abdominal pain, etc).

## 2021-03-19 ENCOUNTER — Other Ambulatory Visit: Payer: Self-pay

## 2021-03-19 ENCOUNTER — Ambulatory Visit
Admission: RE | Admit: 2021-03-19 | Discharge: 2021-03-19 | Disposition: A | Discharge status: Home / Self Care | Payer: BC Managed Care – PPO | Source: Ambulatory Visit | Attending: Family Medicine | Admitting: Family Medicine

## 2021-03-19 DIAGNOSIS — M545 Low back pain, unspecified: Secondary | ICD-10-CM | POA: Insufficient documentation

## 2021-03-22 ENCOUNTER — Other Ambulatory Visit: Payer: Self-pay | Admitting: Family Medicine

## 2021-03-22 DIAGNOSIS — M5431 Sciatica, right side: Secondary | ICD-10-CM

## 2021-03-27 DIAGNOSIS — M461 Sacroiliitis, not elsewhere classified: Secondary | ICD-10-CM | POA: Diagnosis not present

## 2021-03-27 DIAGNOSIS — Z6841 Body Mass Index (BMI) 40.0 and over, adult: Secondary | ICD-10-CM | POA: Diagnosis not present

## 2021-03-27 DIAGNOSIS — R030 Elevated blood-pressure reading, without diagnosis of hypertension: Secondary | ICD-10-CM | POA: Diagnosis not present

## 2021-04-15 DIAGNOSIS — G8929 Other chronic pain: Secondary | ICD-10-CM | POA: Diagnosis not present

## 2021-04-15 DIAGNOSIS — M549 Dorsalgia, unspecified: Secondary | ICD-10-CM | POA: Diagnosis not present

## 2021-05-08 ENCOUNTER — Encounter: Payer: Self-pay | Admitting: Family Medicine

## 2021-05-12 ENCOUNTER — Other Ambulatory Visit: Payer: Self-pay | Admitting: Family Medicine

## 2021-05-12 MED ORDER — OZEMPIC (0.25 OR 0.5 MG/DOSE) 2 MG/1.5ML ~~LOC~~ SOPN: 0.5000 mg | PEN_INJECTOR | SUBCUTANEOUS | 3 refills | Status: DC

## 2021-05-19 ENCOUNTER — Other Ambulatory Visit: Payer: Self-pay | Admitting: Family Medicine

## 2021-05-19 MED ORDER — OZEMPIC (0.25 OR 0.5 MG/DOSE) 2 MG/1.5ML ~~LOC~~ SOPN: 0.5000 mg | PEN_INJECTOR | SUBCUTANEOUS | 3 refills | Status: DC

## 2021-05-24 ENCOUNTER — Encounter: Payer: Self-pay | Admitting: Family Medicine

## 2021-05-24 MED ORDER — CITALOPRAM HYDROBROMIDE 20 MG PO TABS: 10.0000 mg | ORAL_TABLET | Freq: Every day | ORAL | 0 refills | Status: DC

## 2021-05-24 NOTE — Telephone Encounter (Signed)
I spoke with pt; pt said had panic attacks awhile back and pt was on lexapro in 2019 and that helped and then panic attacks just stopped and pt did not have to take any meds for panic attacks. Pt said recently panic attacks have started back and pt having at least one panic attack every other night. Pt had panic attack last night and had to go outside to get his breath. Pt said same thing happened yesterday during the day; pt was getting in his dad's car and had to get out of car to get his breath.  Pt said that his dad is in nursing home and pt is under a lot of stress. Pt requesting refill on lexapro. Dr Damita Dunnings out of office today and not sure if he is going on computer or not. No available appts at [facility] No SI/HI. Pt is presently at work in [place] and will not get home til 8 PM tonight. Pt does not want to go to UC and request appt at [facility] on 05/27/21. Dr Damita Dunnings does not have available appt and pt scheduled appt with Dr Darnell Level on 05/27/21 at 10;30 at [facility], pt said the rest of his family sees Dr Darnell Level and pt feels like he needs to be seen by [facility]. UC & ED precautions given and pt voiced understanding. Sending note to DR Damita Dunnings as PCP and Dr Darnell Level and Lattie Haw CMA.

## 2021-05-24 NOTE — Telephone Encounter (Signed)
I agree with and greatly appreciate Dr. Synthia Innocent input.  I thank all involved.

## 2021-05-27 ENCOUNTER — Encounter: Payer: Self-pay | Admitting: Family Medicine

## 2021-05-27 ENCOUNTER — Ambulatory Visit: Payer: BC Managed Care – PPO | Admitting: Family Medicine

## 2021-05-27 ENCOUNTER — Other Ambulatory Visit: Payer: Self-pay

## 2021-05-27 DIAGNOSIS — F418 Other specified anxiety disorders: Secondary | ICD-10-CM

## 2021-05-27 MED ORDER — HYDROXYZINE HCL 25 MG PO TABS: 12.5000 mg | ORAL_TABLET | Freq: Two times a day (BID) | ORAL | 1 refills | Status: DC | PRN

## 2021-05-27 NOTE — Assessment & Plan Note (Addendum)
Describes increase in anxiety attacks since father's illness - he's been helping care for him which has been an increased stressor.  Will restart celexa at 10mg  daily (previously effective). Discussed PRN medication to use in interim while celexa takes effect - will start hydroxyzine 12.5-25mg  PRN. Reviewed sedation precautions.  Update with effect of above treatment regimen.

## 2021-05-27 NOTE — Progress Notes (Signed)
Patient ID: Kyle Murphy, male    DOB: May 10, 1976, 45 y.o.   MRN: [id number]  This visit was conducted in person.  BP 140/82    Pulse 82    Temp (!) 97.5 F (36.4 C) (Temporal)    Ht 5' 10"  (1.778 m)    Wt (!) 319 lb 1 oz (144.7 kg) Comment: Wanted to weigh againg w/o jacket and sweaters.   SpO2 97%    BMI 45.78 kg/m    CC: anxiety attacks Subjective:   HPI: Kyle Murphy is a 45 y.o. male presenting on 05/27/2021 for Panic Attack (C/o panic attacks off and on. )   See last week's mychart message.  Notes increasing anxiety attacks over the past few weeks - manifest as chest tightness, shortness of breath, trouble sleeping. Has had them in the past and this was similar.  No headaches, palpitations, dizziness, abd pain, nausea, chest pain is not exertional.   Treats with going outdoors to cool off and trying to take deep breaths.   High stress at home helping care for father who's recently been in and out of hospital then rehab. Increased anxiety correlates to father's illness.   Work has been going well.      Relevant past medical, surgical, family and social history reviewed and updated as indicated. Interim medical history since our last visit reviewed. Allergies and medications reviewed and updated. Outpatient Medications Prior to Visit  Medication Sig Dispense Refill   Blood Glucose Calibration (CONTOUR NEXT CONTROL) Normal SOLN Use to check controls on glucose meter. 1 each 3   Blood Glucose Monitoring Suppl (CONTOUR NEXT MONITOR) w/Device KIT Use to check blood sugar up to four times daily as directed. (FOR ICD-10 E11.9). Non insulin dependent. 1 kit 0   citalopram (CELEXA) 20 MG tablet Take 0.5 tablets (10 mg total) by mouth daily. 45 tablet 0   cyclobenzaprine (FLEXERIL) 10 MG tablet Take 1 tablet (10 mg total) by mouth 3 (three) times daily as needed for muscle spasms (sedation caution). 30 tablet 1   gabapentin (NEURONTIN) 100 MG capsule Take 1-2 capsules (100-200 mg  total) by mouth 3 (three) times daily. 90 capsule 3   glimepiride (AMARYL) 2 MG tablet TAKE 1 TABLET BY MOUTH  DAILY BEFORE BREAKFAST 90 tablet 3   glucose blood (CONTOUR NEXT TEST) test strip Use to check blood sugar up to four times daily as directed. (FOR ICD-10 E11.9). Non insulin dependent. 100 each 11   Lancet Devices (MICROLET NEXT LANCING DEVICE) MISC Use to check blood sugar up to four times daily as directed. (FOR ICD-10 E11.9). Non insulin dependent 1 each 0   metFORMIN (GLUCOPHAGE) 500 MG tablet TAKE 1 TO 2 TABLETS BY  MOUTH TWICE DAILY WITH  MEALS 360 tablet 3   Semaglutide,0.25 or 0.5MG/DOS, (OZEMPIC, 0.25 OR 0.5 MG/DOSE,) 2 MG/1.5ML SOPN Inject 0.5 mg as directed once a week. 3 mL 3   loratadine (CLARITIN) 10 MG tablet Take 1 tablet (10 mg total) by mouth daily as needed for allergies.     No facility-administered medications prior to visit.     Per HPI unless specifically indicated in ROS section below Review of Systems  Objective:  BP 140/82    Pulse 82    Temp (!) 97.5 F (36.4 C) (Temporal)    Ht 5' 10"  (1.778 m)    Wt (!) 319 lb 1 oz (144.7 kg) Comment: Wanted to weigh againg w/o jacket and sweaters.   SpO2 97%  BMI 45.78 kg/m   Wt Readings from Last 3 Encounters:  05/27/21 (!) 319 lb 1 oz (144.7 kg)  03/04/21 (!) 310 lb (140.6 kg)  02/04/21 (!) 313 lb (142 kg)      Physical Exam Vitals and nursing note reviewed.  Constitutional:      Appearance: Normal appearance. He is obese. He is not ill-appearing.  Cardiovascular:     Rate and Rhythm: Normal rate and regular rhythm.     Pulses: Normal pulses.     Heart sounds: Normal heart sounds. No murmur heard. Pulmonary:     Effort: Pulmonary effort is normal. No respiratory distress.     Breath sounds: Normal breath sounds. No wheezing, rhonchi or rales.  Neurological:     Mental Status: He is alert.  Psychiatric:        Mood and Affect: Mood normal.        Behavior: Behavior normal.      Results for orders  placed or performed in visit on 03/04/21  POCT glycosylated hemoglobin (Hb A1C)  Result Value Ref Range   Hemoglobin A1C 9.9 (A) 4.0 - 5.6 %   HbA1c POC (<> result, manual entry)     HbA1c, POC (prediabetic range)     HbA1c, POC (controlled diabetic range)     Depression screen [facility] 2/9 05/27/2021 08/31/2020 08/15/2019 11/25/2016  Decreased Interest 0 0 0 0  Down, Depressed, Hopeless 0 0 0 0  PHQ - 2 Score 0 0 0 0  Altered sleeping 1 - - -  Tired, decreased energy 1 - - -  Change in appetite 0 - - -  Feeling bad or failure about yourself  0 - - -  Trouble concentrating 0 - - -  Moving slowly or fidgety/restless 0 - - -  Suicidal thoughts 0 - - -  PHQ-9 Score 2 - - -    GAD 7 : Generalized Anxiety Score 05/27/2021  Nervous, Anxious, on Edge 1  Control/stop worrying 0  Worry too much - different things 1  Trouble relaxing 0  Restless 1  Easily annoyed or irritable 1  Afraid - awful might happen 0  Total GAD 7 Score 4   Assessment & Plan:  This visit occurred during the SARS-CoV-2 public health emergency.  Safety protocols were in place, including screening questions prior to the visit, additional usage of staff PPE, and extensive cleaning of exam room while observing appropriate contact time as indicated for disinfecting solutions.   Problem List Items Addressed This Visit     Anxiety with limited-symptom attacks    Describes increase in anxiety attacks since father's illness - he's been helping care for him which has been an increased stressor.  Will restart celexa at 11m daily (previously effective). Discussed PRN medication to use in interim while celexa takes effect - will start hydroxyzine 12.5-25mg PRN. Reviewed sedation precautions.  Update with effect of above treatment regimen.       Relevant Medications   hydrOXYzine (ATARAX) 25 MG tablet     Meds ordered this encounter  Medications   hydrOXYzine (ATARAX) 25 MG tablet    Sig: Take 0.5-1 tablets (12.5-25 mg total)  by mouth 2 (two) times daily as needed for anxiety (sedation precautions).    Dispense:  30 tablet    Refill:  1   No orders of the defined types were placed in this encounter.    Patient Instructions  Continue celexa 1/2 tablet daily.  Add on hydroxyzine 275m1/2-1 tablet  as needed for anxiety attack.  Let us know how you do with this.   Follow up plan: Return if symptoms worsen or fail to improve.  Ria Bush, MD

## 2021-05-27 NOTE — Patient Instructions (Addendum)
Continue celexa 1/2 tablet daily.  Add on hydroxyzine 25mg  1/2-1 tablet as needed for anxiety attack.  Let us know how you do with this.

## 2021-06-05 ENCOUNTER — Other Ambulatory Visit: Payer: Self-pay | Admitting: Family Medicine

## 2021-06-12 ENCOUNTER — Other Ambulatory Visit: Payer: Self-pay | Admitting: Family Medicine

## 2021-06-12 DIAGNOSIS — E119 Type 2 diabetes mellitus without complications: Secondary | ICD-10-CM

## 2021-06-13 ENCOUNTER — Telehealth: Payer: Self-pay | Admitting: Family Medicine

## 2021-06-13 NOTE — Telephone Encounter (Signed)
-----   Message from Tonia Ghent, MD sent at 06/12/2021  5:53 PM EST ----- I put in the order for a POC A1c.  He needs that at lab visit.  Please see about getting a phone or video visit set up after that.  Thanks.   Brigitte Pulse

## 2021-06-13 NOTE — Telephone Encounter (Signed)
Called Kyle Murphy to remind him the lab apt and schedule him for a ov

## 2021-06-17 ENCOUNTER — Other Ambulatory Visit (INDEPENDENT_AMBULATORY_CARE_PROVIDER_SITE_OTHER): Payer: BC Managed Care – PPO

## 2021-06-17 ENCOUNTER — Other Ambulatory Visit: Payer: Self-pay

## 2021-06-17 DIAGNOSIS — E119 Type 2 diabetes mellitus without complications: Secondary | ICD-10-CM

## 2021-06-17 LAB — POCT GLYCOSYLATED HEMOGLOBIN (HGB A1C): Hemoglobin A1C: 6.9 % — AB (ref 4.0–5.6)

## 2021-06-30 ENCOUNTER — Other Ambulatory Visit: Payer: Self-pay | Admitting: Family Medicine

## 2021-07-15 ENCOUNTER — Other Ambulatory Visit: Payer: Self-pay | Admitting: Family Medicine

## 2021-08-16 ENCOUNTER — Other Ambulatory Visit: Payer: Self-pay | Admitting: Family Medicine

## 2021-08-28 LAB — HM DIABETES EYE EXAM

## 2021-09-01 ENCOUNTER — Other Ambulatory Visit: Payer: Self-pay | Admitting: Family Medicine

## 2021-09-01 DIAGNOSIS — E119 Type 2 diabetes mellitus without complications: Secondary | ICD-10-CM

## 2021-09-02 ENCOUNTER — Other Ambulatory Visit (INDEPENDENT_AMBULATORY_CARE_PROVIDER_SITE_OTHER): Payer: BC Managed Care – PPO

## 2021-09-02 ENCOUNTER — Other Ambulatory Visit: Payer: Self-pay

## 2021-09-02 DIAGNOSIS — E119 Type 2 diabetes mellitus without complications: Secondary | ICD-10-CM

## 2021-09-02 LAB — COMPREHENSIVE METABOLIC PANEL
ALT: 24 U/L (ref 0–53)
AST: 15 U/L (ref 0–37)
Albumin: 2.9 g/dL — ABNORMAL LOW (ref 3.5–5.2)
Alkaline Phosphatase: 51 U/L (ref 39–117)
BUN: 12 mg/dL (ref 6–23)
CO2: 30 mEq/L (ref 19–32)
Calcium: 8.4 mg/dL (ref 8.4–10.5)
Chloride: 105 mEq/L (ref 96–112)
Creatinine, Ser: 0.77 mg/dL (ref 0.40–1.50)
GFR: 107.53 mL/min (ref >60.00)
Glucose, Bld: 123 mg/dL — ABNORMAL HIGH (ref 70–99)
Potassium: 3.9 mEq/L (ref 3.5–5.1)
Sodium: 141 mEq/L (ref 135–145)
Total Bilirubin: 0.2 mg/dL (ref 0.2–1.2)
Total Protein: 5.9 g/dL — ABNORMAL LOW (ref 6.0–8.3)

## 2021-09-02 LAB — MICROALBUMIN / CREATININE URINE RATIO
Creatinine,U: 61.8 mg/dL
Microalb Creat Ratio: 423.4 mg/g — ABNORMAL HIGH (ref 0.0–30.0)
Microalb, Ur: 261.6 mg/dL — ABNORMAL HIGH (ref 0.0–1.9)

## 2021-09-02 LAB — HEMOGLOBIN A1C: Hgb A1c MFr Bld: 7 % — ABNORMAL HIGH (ref 4.6–6.5)

## 2021-09-02 LAB — LIPID PANEL
Cholesterol: 220 mg/dL — ABNORMAL HIGH (ref 0–200)
HDL: 31.8 mg/dL — ABNORMAL LOW (ref >39.00)
Total CHOL/HDL Ratio: 7
Triglycerides: 585 mg/dL — ABNORMAL HIGH (ref 0.0–149.0)

## 2021-09-02 LAB — LDL CHOLESTEROL, DIRECT: Direct LDL: 111 mg/dL

## 2021-09-09 ENCOUNTER — Ambulatory Visit: Payer: BC Managed Care – PPO | Admitting: Family Medicine

## 2021-09-09 ENCOUNTER — Encounter: Payer: Self-pay | Admitting: Family Medicine

## 2021-09-09 VITALS — BP 130/80 | HR 79 | Temp 97.7°F | Ht 70.0 in | Wt 317.0 lb

## 2021-09-09 DIAGNOSIS — R0683 Snoring: Secondary | ICD-10-CM

## 2021-09-09 DIAGNOSIS — Z1159 Encounter for screening for other viral diseases: Secondary | ICD-10-CM

## 2021-09-09 DIAGNOSIS — Z789 Other specified health status: Secondary | ICD-10-CM

## 2021-09-09 DIAGNOSIS — Z0000 Encounter for general adult medical examination without abnormal findings: Secondary | ICD-10-CM

## 2021-09-09 DIAGNOSIS — F418 Other specified anxiety disorders: Secondary | ICD-10-CM

## 2021-09-09 DIAGNOSIS — Z7189 Other specified counseling: Secondary | ICD-10-CM

## 2021-09-09 DIAGNOSIS — Z1211 Encounter for screening for malignant neoplasm of colon: Secondary | ICD-10-CM

## 2021-09-09 DIAGNOSIS — E119 Type 2 diabetes mellitus without complications: Secondary | ICD-10-CM

## 2021-09-09 DIAGNOSIS — K219 Gastro-esophageal reflux disease without esophagitis: Secondary | ICD-10-CM

## 2021-09-09 MED ORDER — SCOPOLAMINE 1 MG/3DAYS TD PT72: 1.0000 | MEDICATED_PATCH | TRANSDERMAL | 1 refills | Status: DC

## 2021-09-09 MED ORDER — FISH OIL 1000 MG PO CAPS: 1000.0000 mg | ORAL_CAPSULE | Freq: Two times a day (BID) | ORAL | Status: DC

## 2021-09-09 MED ORDER — PANTOPRAZOLE SODIUM 20 MG PO TBEC: 20.0000 mg | DELAYED_RELEASE_TABLET | Freq: Every day | ORAL | 3 refills | Status: DC

## 2021-09-09 MED ORDER — CITALOPRAM HYDROBROMIDE 10 MG PO TABS: 10.0000 mg | ORAL_TABLET | Freq: Every day | ORAL | 3 refills | Status: DC

## 2021-09-09 MED ORDER — GLIMEPIRIDE 2 MG PO TABS: 2.0000 mg | ORAL_TABLET | Freq: Every day | ORAL | 3 refills | Status: DC

## 2021-09-09 NOTE — Progress Notes (Signed)
CPE- See plan.  Routine anticipatory guidance given to patient.  See health maintenance.  The possibility exists that previously documented standard health maintenance information may have been brought forward from a previous encounter into this note.  If needed, that same information has been updated to reflect the current situation based on today's encounter.   ? ?Tetanus 2018 ?Flu 2022 ?PNA 2019 ?shingles not due ?covid vaccine 2021 ?D/w patient WC:BJSEGBT for colon cancer screening, including IFOB vs. colonoscopy.  Risks and benefits of both were discussed and patient voiced understanding.  Pt elects for GI referral.   ?PSA not due.   ?Living will d/w pt.  Mother designated if patient were incapacitated.  ?Diet and exercise d/w pt.   ?We talked about HCV screening and he can get done with future labs if desired.   ? ?He is going on a cruise this fall.  D/w pt about options, i.e. scopolamine.  Routine cautions given to patient. ? ?Diabetes:  ?Using medications without difficulties: yes ?Hypoglycemic episodes:no ?Hyperglycemic episodes:no ?Feet problems: no ?Blood Sugars averaging: ~120 ?eye exam within last year: d/w pt about f/u.  He was seen last week, I am awaiting consult note.   [facility].   ?MALB pos, labs d/w pt.  Would still limit ACE use given his hx.  D/w pt.   ?Statin intolerant.  GI upset with pravastatin.   ?Taking 2 metformin BID. Still on glimepiride.  Ozempic 0.'5mg'$  weekly.  No ADE on meds, d/w pt.  No FCNAVD.   ? ?Inc GERD in the meantime.  D/w pt about abd girth and weight reduction.  Off PPI in the meantime.   ? ?Fatigue noted.  Snoring.  D/w pt about OSA eval.  18.5" neck.   ? ?Mood d/w pt.  Restarted citalopram.  Anxiety is better in he meantime.  No ADE on med.  No SI/HI.  No recent hydroxyzine use/need.   ? ?PMH and SH reviewed ? ?Meds, vitals, and allergies reviewed.  ? ?ROS: Per HPI.  Unless specifically indicated otherwise in HPI, the patient denies: ? ?General: fever. ?Eyes: acute  vision changes ?ENT: sore throat ?Cardiovascular: chest pain ?Respiratory: SOB ?GI: vomiting ?GU: dysuria ?Musculoskeletal: acute back pain ?Derm: acute rash ?Neuro: acute motor dysfunction ?Psych: worsening mood ?Endocrine: polydipsia ?Heme: bleeding ?Allergy: hayfever ? ?GEN: nad, alert and oriented ?HEENT: mucous membranes moist ?NECK: supple w/o LA ?CV: rrr. ?PULM: ctab, no inc wob ?ABD: soft, +bs ?EXT: no edema ?SKIN: no acute rash ? ?Diabetic foot exam: ?Normal inspection ?No skin breakdown ?No calluses  ?Normal DP pulses ?Normal sensation to light touch and monofilament ?Nails normal ? ? ?

## 2021-09-09 NOTE — Patient Instructions (Addendum)
We'll call about seeing GI and pulmonary.  ?Use the patch if needed for motion sickness on the cruise.   ?Try adding fish oil in the meantime and keep working on diet.   ?Plan on recheck A1c in about 4 months at a visit.  ?Keep working on diet and exercise in the meantime.  ?Take care.  Glad to see you. ?

## 2021-09-10 ENCOUNTER — Other Ambulatory Visit: Payer: Self-pay | Admitting: Family Medicine

## 2021-09-10 ENCOUNTER — Other Ambulatory Visit: Payer: Self-pay

## 2021-09-10 DIAGNOSIS — Z1211 Encounter for screening for malignant neoplasm of colon: Secondary | ICD-10-CM

## 2021-09-10 MED ORDER — NA SULFATE-K SULFATE-MG SULF 17.5-3.13-1.6 GM/177ML PO SOLN: 1.0000 | Freq: Once | ORAL | 0 refills | Status: AC

## 2021-09-10 NOTE — Progress Notes (Signed)
Gastroenterology Pre-Procedure Review ? ?Request Date: 10/11/2021 ?Requesting Physician: Dr. Marius Ditch ? ? ?PATIENT REVIEW QUESTIONS: The patient responded to the following health history questions as indicated:   ? ?1. Are you having any GI issues? no ?2. Do you have a personal history of Polyps? no ?3. Do you have a family history of Colon Cancer or Polyps? no ?4. Diabetes Mellitus? yes (TYPE 2 ) ?5. Joint replacements in the past 12 months?no ?6. Major health problems in the past 3 months?no ?7. Any artificial heart valves, MVP, or defibrillator?no ?   ?MEDICATIONS & ALLERGIES:    ?Patient reports the following regarding taking any anticoagulation/antiplatelet therapy:   ?Plavix, Coumadin, Eliquis, Xarelto, Lovenox, Pradaxa, Brilinta, or Effient? no ?Aspirin? no ? ?Patient confirms/reports the following medications:  ?Current Outpatient Medications  ?Medication Sig Dispense Refill  ? Blood Glucose Calibration (CONTOUR NEXT CONTROL) Normal SOLN Use to check controls on glucose meter. 1 each 3  ? Blood Glucose Monitoring Suppl (CONTOUR NEXT MONITOR) w/Device KIT Use to check blood sugar up to four times daily as directed. (FOR ICD-10 E11.9). Non insulin dependent. 1 kit 0  ? citalopram (CELEXA) 10 MG tablet Take 1 tablet (10 mg total) by mouth daily. 90 tablet 3  ? cyclobenzaprine (FLEXERIL) 10 MG tablet Take 1 tablet (10 mg total) by mouth 3 (three) times daily as needed for muscle spasms (sedation caution). 30 tablet 1  ? gabapentin (NEURONTIN) 100 MG capsule Take 1-2 capsules (100-200 mg total) by mouth 3 (three) times daily. 90 capsule 3  ? glimepiride (AMARYL) 2 MG tablet Take 1 tablet (2 mg total) by mouth daily before breakfast. 90 tablet 3  ? glucose blood (CONTOUR NEXT TEST) test strip Use to check blood sugar up to four times daily as directed. (FOR ICD-10 E11.9). Non insulin dependent. 100 each 11  ? hydrOXYzine (ATARAX) 25 MG tablet Take 0.5-1 tablets (12.5-25 mg total) by mouth 2 (two) times daily as  needed for anxiety (sedation precautions). (Patient not taking: Reported on 09/09/2021) 30 tablet 1  ? Lancet Devices (MICROLET NEXT LANCING DEVICE) MISC Use to check blood sugar up to four times daily as directed. (FOR ICD-10 E11.9). Non insulin dependent 1 each 0  ? metFORMIN (GLUCOPHAGE) 500 MG tablet TAKE 1 TO 2 TABLETS BY  MOUTH TWICE DAILY WITH  MEALS 360 tablet 3  ? Omega-3 Fatty Acids (FISH OIL) 1000 MG CAPS Take 1 capsule (1,000 mg total) by mouth in the morning and at bedtime.    ? OZEMPIC, 0.25 OR 0.5 MG/DOSE, 2 MG/1.5ML SOPN INJECT 0.5 MG SUBCUTANEOUSLY AS  DIRECTED WEEKLY 4.5 mL 3  ? pantoprazole (PROTONIX) 20 MG tablet Take 1 tablet (20 mg total) by mouth daily. 90 tablet 3  ? scopolamine (TRANSDERM-SCOP) 1 MG/3DAYS Place 1 patch (1.5 mg total) onto the skin every 3 (three) days. 4 patch 1  ? ?No current facility-administered medications for this visit.  ? ? ?Patient confirms/reports the following allergies:  ?Allergies  ?Allergen Reactions  ? Ace Inhibitors   ?  H/o lip swelling.   ? Angiotensin Receptor Blockers   ?  H/o lip swelling.   ? Penicillins   ?  Has patient had a PCN reaction causing immediate rash, facial/tongue/throat swelling, SOB or lightheadedness with hypotension: No ?Has patient had a PCN reaction causing severe rash involving mucus membranes or skin necrosis: No ?Has patient had a PCN reaction that required hospitalization: No ?Has patient had a PCN reaction occurring within the last 10 years: No ?If  all of the above answers are "NO", then may proceed with Cephalosporin use. ?  ? Pravastatin   ?  GI upset.   ? ? ?No orders of the defined types were placed in this encounter. ? ? ?AUTHORIZATION INFORMATION ?Primary Insurance: ?1D#: ?Group #: ? ?Secondary Insurance: ?1D#: ?Group #: ? ?SCHEDULE INFORMATION: ?Date: 10/11/2021 ?Time: ?[place] ? ?

## 2021-09-11 DIAGNOSIS — Z789 Other specified health status: Secondary | ICD-10-CM | POA: Insufficient documentation

## 2021-09-11 DIAGNOSIS — R0683 Snoring: Secondary | ICD-10-CM | POA: Insufficient documentation

## 2021-09-11 NOTE — Assessment & Plan Note (Signed)
Refer to pulmonary for sleep apnea testing.  Sleep apnea pathophysiology discussed with patient. ?

## 2021-09-11 NOTE — Assessment & Plan Note (Signed)
?  Mood d/w pt.  Restarted citalopram.  Anxiety is better in he meantime.  No ADE on med.  No SI/HI.  No recent hydroxyzine use/need.  Would continue as is with citalopram. ?

## 2021-09-11 NOTE — Assessment & Plan Note (Signed)
Tetanus 2018 ?Flu 2022 ?PNA 2019 ?shingles not due ?covid vaccine 2021 ?D/w patient ZG:[id number] for colon cancer screening, including IFOB vs. colonoscopy.  Risks and benefits of both were discussed and patient voiced understanding.  Pt elects for GI referral.   ?PSA not due.   ?Living will d/w pt.  Mother designated if patient were incapacitated.  ?Diet and exercise d/w pt.   ?We talked about HCV screening and he can get done with future labs if desired.   ?

## 2021-09-11 NOTE — Assessment & Plan Note (Signed)
See above

## 2021-09-11 NOTE — Assessment & Plan Note (Signed)
Living will d/w pt. Mother designated if patient were incapacitated.  

## 2021-09-11 NOTE — Assessment & Plan Note (Signed)
Statin intolerant.  GI upset with pravastatin.   ?Taking 2 metformin BID. Still on glimepiride.  Ozempic 0.'5mg'$  weekly.  No ADE on meds, d/w pt.  No FCNAVD.   ?MALB pos, labs d/w pt.  Would still limit ACE use given his hx.  D/w pt.   ?No change in meds listed above. ?Plan on recheck A1c in about 4 months at a visit.  ?Keep working on diet and exercise in the meantime.  ?

## 2021-09-11 NOTE — Assessment & Plan Note (Signed)
Discussed weight reduction and also seeing GI.  Referral placed. ?

## 2021-10-03 LAB — HM DIABETES EYE EXAM

## 2021-10-05 ENCOUNTER — Encounter: Payer: Self-pay | Admitting: Family Medicine

## 2021-10-10 ENCOUNTER — Encounter: Payer: Self-pay | Admitting: Gastroenterology

## 2021-10-11 ENCOUNTER — Ambulatory Visit: Payer: BC Managed Care – PPO | Admitting: Anesthesiology

## 2021-10-11 ENCOUNTER — Encounter
Admission: RE | Disposition: A | Discharge status: Home / Self Care | Payer: Self-pay | Source: Home / Self Care | Attending: Gastroenterology

## 2021-10-11 ENCOUNTER — Ambulatory Visit
Admission: RE | Admit: 2021-10-11 | Discharge: 2021-10-11 | Disposition: A | Discharge status: Home / Self Care | Payer: BC Managed Care – PPO | Attending: Gastroenterology | Admitting: Gastroenterology

## 2021-10-11 DIAGNOSIS — G4730 Sleep apnea, unspecified: Secondary | ICD-10-CM | POA: Diagnosis not present

## 2021-10-11 DIAGNOSIS — K621 Rectal polyp: Secondary | ICD-10-CM | POA: Diagnosis not present

## 2021-10-11 DIAGNOSIS — K219 Gastro-esophageal reflux disease without esophagitis: Secondary | ICD-10-CM | POA: Insufficient documentation

## 2021-10-11 DIAGNOSIS — Z1211 Encounter for screening for malignant neoplasm of colon: Secondary | ICD-10-CM

## 2021-10-11 DIAGNOSIS — K635 Polyp of colon: Secondary | ICD-10-CM | POA: Diagnosis not present

## 2021-10-11 DIAGNOSIS — Z833 Family history of diabetes mellitus: Secondary | ICD-10-CM | POA: Diagnosis not present

## 2021-10-11 DIAGNOSIS — E119 Type 2 diabetes mellitus without complications: Secondary | ICD-10-CM | POA: Diagnosis not present

## 2021-10-11 DIAGNOSIS — E785 Hyperlipidemia, unspecified: Secondary | ICD-10-CM | POA: Diagnosis not present

## 2021-10-11 DIAGNOSIS — Z7984 Long term (current) use of oral hypoglycemic drugs: Secondary | ICD-10-CM | POA: Diagnosis not present

## 2021-10-11 DIAGNOSIS — Z7985 Long-term (current) use of injectable non-insulin antidiabetic drugs: Secondary | ICD-10-CM | POA: Diagnosis not present

## 2021-10-11 DIAGNOSIS — K5730 Diverticulosis of large intestine without perforation or abscess without bleeding: Secondary | ICD-10-CM | POA: Insufficient documentation

## 2021-10-11 DIAGNOSIS — D128 Benign neoplasm of rectum: Secondary | ICD-10-CM | POA: Insufficient documentation

## 2021-10-11 DIAGNOSIS — G709 Myoneural disorder, unspecified: Secondary | ICD-10-CM | POA: Diagnosis not present

## 2021-10-11 HISTORY — PX: COLONOSCOPY WITH PROPOFOL: SHX5780

## 2021-10-11 LAB — GLUCOSE, CAPILLARY: Glucose-Capillary: 140 mg/dL — ABNORMAL HIGH (ref 70–99)

## 2021-10-11 SURGERY — COLONOSCOPY WITH PROPOFOL
Anesthesia: General

## 2021-10-11 MED ORDER — PROPOFOL 500 MG/50ML IV EMUL
INTRAVENOUS | Status: AC
  Filled 2021-10-11: qty 50

## 2021-10-11 MED ORDER — PROPOFOL 10 MG/ML IV BOLUS
INTRAVENOUS | Status: DC | PRN
  Administered 2021-10-11: 70 mg via INTRAVENOUS

## 2021-10-11 MED ORDER — PROPOFOL 500 MG/50ML IV EMUL
INTRAVENOUS | Status: DC | PRN
  Administered 2021-10-11: 150 ug/kg/min via INTRAVENOUS

## 2021-10-11 MED ORDER — DEXMEDETOMIDINE (PRECEDEX) IN NS 20 MCG/5ML (4 MCG/ML) IV SYRINGE
PREFILLED_SYRINGE | INTRAVENOUS | Status: DC | PRN
  Administered 2021-10-11: 8 ug via INTRAVENOUS

## 2021-10-11 MED ORDER — LIDOCAINE HCL (CARDIAC) PF 100 MG/5ML IV SOSY
PREFILLED_SYRINGE | INTRAVENOUS | Status: DC | PRN
Start: 2021-10-11 — End: 2021-10-11
  Administered 2021-10-11: 50 mg via INTRAVENOUS

## 2021-10-11 MED ORDER — SODIUM CHLORIDE 0.9 % IV SOLN
INTRAVENOUS | Status: DC
  Administered 2021-10-11: 20 mL/h via INTRAVENOUS

## 2021-10-11 NOTE — Transfer of Care (Signed)
Immediate Anesthesia Transfer of Care Note ? ?Patient: Athol Bolds ? ?Procedure(s) Performed: COLONOSCOPY WITH PROPOFOL ? ?Patient Location: PACU and Endoscopy Unit ? ?Anesthesia Type:General ? ?Level of Consciousness: awake, drowsy and patient cooperative ? ?Airway & Oxygen Therapy: Patient Spontanous Breathing ? ?Post-op Assessment: Report given to RN and Post -op Vital signs reviewed and stable ? ?Post vital signs: Reviewed and stable ? ?Last Vitals:  ?Vitals Value Taken Time  ?BP    ?Temp    ?Pulse    ?Resp    ?SpO2    ? ? ?Last Pain:  ?Vitals:  ? 10/11/21 0909  ?TempSrc: Temporal  ?PainSc: 0-No pain  ?   ? ?  ? ?Complications: No notable events documented. ?

## 2021-10-11 NOTE — Anesthesia Preprocedure Evaluation (Signed)
Anesthesia Evaluation  ?Patient identified by MRN, date of birth, ID band ?Patient awake ? ? ? ?Reviewed: ?Allergy & Precautions, NPO status , Patient's Chart, lab work & pertinent test results ? ?History of Anesthesia Complications ?Negative for: history of anesthetic complications ? ?Airway ?Mallampati: III ? ?TM Distance: <3 FB ?Neck ROM: full ? ? ? Dental ? ?(+) Chipped ?  ?Pulmonary ?sleep apnea ,  ?  ?Pulmonary exam normal ? ? ? ? ? ? ? Cardiovascular ?Exercise Tolerance: Good ?(-) angina(-) Past MI negative cardio ROS ?Normal cardiovascular exam ? ? ?  ?Neuro/Psych ? Headaches, PSYCHIATRIC DISORDERS  Neuromuscular disease   ? GI/Hepatic ?Neg liver ROS, GERD  Controlled,  ?Endo/Other  ?diabetes ? Renal/GU ?negative Renal ROS  ?negative genitourinary ?  ?Musculoskeletal ? ? Abdominal ?  ?Peds ? Hematology ?negative hematology ROS ?(+)   ?Anesthesia Other Findings ?Past Medical History: ?No date: Anxiety ?No date: Diabetes [facility]) ?No date: History of heartburn ?No date: HLD (hyperlipidemia) ?No date: Leg cramps ?    Comment:  history of ?No date: Migraines ?    Comment:  with photo and phonophobia ?No date: Multiple thyroid nodules ?No date: Plantar fasciitis ?    Comment:  L foot, injected at foot center 2014 ?2011: Salmonella enteritis ?    Comment:  stool culture positive ? ?Past Surgical History: ?No date: CHOLECYSTECTOMY ?No date: TONSILLECTOMY ? ?BMI   ? Body Mass Index: 45.92 kg/m?  ?  ? ? Reproductive/Obstetrics ?negative OB ROS ? ?  ? ? ? ? ? ? ? ? ? ? ? ? ? ?  ?  ? ? ? ? ? ? ? ? ?Anesthesia Physical ?Anesthesia Plan ? ?ASA: 3 ? ?Anesthesia Plan: General  ? ?Post-op Pain Management:   ? ?Induction: Intravenous ? ?PONV Risk Score and Plan: Propofol infusion and TIVA ? ?Airway Management Planned: Natural Airway and Nasal Cannula ? ?Additional Equipment:  ? ?Intra-op Plan:  ? ?Post-operative Plan:  ? ?Informed Consent: I have reviewed the patients History and Physical,  chart, labs and discussed the procedure including the risks, benefits and alternatives for the proposed anesthesia with the patient or authorized representative who has indicated his/her understanding and acceptance.  ? ? ? ?Dental Advisory Given ? ?Plan Discussed with: Anesthesiologist, CRNA and Surgeon ? ?Anesthesia Plan Comments: (Patient consented for risks of anesthesia including but not limited to:  ?- adverse reactions to medications ?- risk of airway placement if required ?- damage to eyes, teeth, lips or other oral mucosa ?- nerve damage due to positioning  ?- sore throat or hoarseness ?- Damage to heart, brain, nerves, lungs, other parts of body or loss of life ? ?Patient voiced understanding.)  ? ? ? ? ? ? ?Anesthesia Quick Evaluation ? ?

## 2021-10-11 NOTE — Anesthesia Postprocedure Evaluation (Signed)
Anesthesia Post Note ? ?Patient: Terrall Bley ? ?Procedure(s) Performed: COLONOSCOPY WITH PROPOFOL ? ?Patient location during evaluation: Endoscopy ?Anesthesia Type: General ?Level of consciousness: awake and alert ?Pain management: pain level controlled ?Vital Signs Assessment: post-procedure vital signs reviewed and stable ?Respiratory status: spontaneous breathing, nonlabored ventilation, respiratory function stable and patient connected to nasal cannula oxygen ?Cardiovascular status: blood pressure returned to baseline and stable ?Postop Assessment: no apparent nausea or vomiting ?Anesthetic complications: no ? ? ?No notable events documented. ? ? ?Last Vitals:  ?Vitals:  ? 10/11/21 0909 10/11/21 1048  ?BP: (!) 142/87 130/74  ?Pulse: 93 89  ?Resp: 20 17  ?Temp: (!) 36.3 ?C (!) 36.4 ?C  ?SpO2: 98% 96%  ?  ?Last Pain:  ?Vitals:  ? 10/11/21 1108  ?TempSrc:   ?PainSc: 0-No pain  ? ? ?  ?  ?  ?  ?  ?  ? ?Precious Haws Malaysha Arlen ? ? ? ? ?

## 2021-10-11 NOTE — Op Note (Signed)
[facility] ?Gastroenterology ?Patient Name: Kyle Murphy ?Procedure Date: 10/11/2021 10:06 AM ?MRN: [id number] ?Account #: [id number] ?Date of Birth: 07/02/1975 ?Admit Type: Outpatient ?Age: 46 ?Room: [facility] ENDO ROOM 2 ?Gender: Male ?Note Status: Finalized ?Instrument Name: Colonoscope [id number] ?Procedure:             Colonoscopy ?Indications:           Screening for colorectal malignant neoplasm, This is  ?                       the patient's first colonoscopy ?Providers:             Lin Landsman MD, MD ?Medicines:             General Anesthesia ?Complications:         No immediate complications. Estimated blood loss: None. ?Procedure:             Pre-Anesthesia Assessment: ?                       - Prior to the procedure, a History and Physical was  ?                       performed, and patient medications and allergies were  ?                       reviewed. The patient is competent. The risks and  ?                       benefits of the procedure and the sedation options and  ?                       risks were discussed with the patient. All questions  ?                       were answered and informed consent was obtained.  ?                       Patient identification and proposed procedure were  ?                       verified by the physician, the nurse, the  ?                       anesthesiologist, the anesthetist and the technician  ?                       in the pre-procedure area in the procedure room in the  ?                       endoscopy suite. Mental Status Examination: alert and  ?                       oriented. Airway Examination: normal oropharyngeal  ?                       airway and neck mobility. Respiratory Examination:  ?                       clear to auscultation.  CV Examination: normal.  ?                       Prophylactic Antibiotics: The patient does not require  ?                       prophylactic antibiotics. Prior Anticoagulants: The  ?                        patient has taken no previous anticoagulant or  ?                       antiplatelet agents. ASA Grade Assessment: III - A  ?                       patient with severe systemic disease. After reviewing  ?                       the risks and benefits, the patient was deemed in  ?                       satisfactory condition to undergo the procedure. The  ?                       anesthesia plan was to use general anesthesia.  ?                       Immediately prior to administration of medications,  ?                       the patient was re-assessed for adequacy to receive  ?                       sedatives. The heart rate, respiratory rate, oxygen  ?                       saturations, blood pressure, adequacy of pulmonary  ?                       ventilation, and response to care were monitored  ?                       throughout the procedure. The physical status of the  ?                       patient was re-assessed after the procedure. ?                       After obtaining informed consent, the colonoscope was  ?                       passed under direct vision. Throughout the procedure,  ?                       the patient's blood pressure, pulse, and oxygen  ?                       saturations were monitored continuously. The  ?  Colonoscope was introduced through the anus and  ?                       advanced to the the cecum, identified by appendiceal  ?                       orifice and ileocecal valve. The colonoscopy was  ?                       performed without difficulty. The patient tolerated  ?                       the procedure well. The quality of the bowel  ?                       preparation was evaluated using the BBPS [facility] Bowel  ?                       Preparation Scale) with scores of: Right Colon = 3,  ?                       Transverse Colon = 3 and Left Colon = 3 (entire mucosa  ?                       seen well with no residual staining, small fragments   ?                       of stool or opaque liquid). The total BBPS score  ?                       equals 9. ?Findings: ?     The perianal and digital rectal examinations were normal. Pertinent  ?     negatives include normal sphincter tone and no palpable rectal lesions. ?     A 4 mm polyp was found in the rectum. The polyp was sessile. The polyp  ?     was removed with a cold snare. Resection and retrieval were complete. ?     Multiple small and large-mouthed diverticula were found in the entire  ?     colon. There was no evidence of diverticular bleeding. ?     The retroflexed view of the distal rectum and anal verge was normal and  ?     showed no anal or rectal abnormalities. ?Impression:            - One 4 mm polyp in the rectum, removed with a cold  ?                       snare. Resected and retrieved. ?                       - Severe diverticulosis in the entire examined colon.  ?                       There was no evidence of diverticular bleeding. ?                       - The distal rectum and anal verge are normal on  ?  retroflexion view. ?Recommendation:        - Discharge patient to home (with escort). ?                       - Resume previous diet today. ?                       - Continue present medications. ?                       - Await pathology results. ?                       - Repeat colonoscopy in 7-10 years for surveillance  ?                       based on pathology results. ?Procedure Code(s):     --- Professional --- ?                       [phone], Colonoscopy, flexible; with removal of  ?                       tumor(s), polyp(s), or other lesion(s) by snare  ?                       technique ?Diagnosis Code(s):     --- Professional --- ?                       Z12.11, Encounter for screening for malignant neoplasm  ?                       of colon ?                       K62.1, Rectal polyp ?                       K57.30, Diverticulosis of large intestine without  ?                        perforation or abscess without bleeding ?CPT copyright 2019 American Medical Association. All rights reserved. ?The codes documented in this report are preliminary and upon coder review may  ?be revised to meet current compliance requirements. ?Dr. Ulyess Mort ?Mackie Goon Raeanne Gathers MD, MD ?10/11/2021 10:42:12 AM ?This report has been signed electronically. ?Number of Addenda: 0 ?Note Initiated On: 10/11/2021 10:06 AM ?Scope Withdrawal Time: 0 hours 9 minutes 58 seconds  ?Total Procedure Duration: 0 hours 13 minutes 32 seconds  ?Estimated Blood Loss:  Estimated blood loss: none. ?     [facility] ?

## 2021-10-11 NOTE — H&P (Signed)
?Cephas Darby, MD ?[address]  ?[address]  ?[place], [place] [address]  ?Main: [phone]  ?Fax: [phone] ?Pager: [phone] ? ?Primary Care Physician:  Tonia Ghent, MD ?Primary Gastroenterologist:  Dr. Cephas Darby ? ?Pre-Procedure History & Physical: ?HPI:  Kyle Murphy is a 46 y.o. male is here for an colonoscopy. ?  ?Past Medical History:  ?Diagnosis Date  ? Anxiety   ? Diabetes ([place])   ? History of heartburn   ? HLD (hyperlipidemia)   ? Leg cramps   ? history of  ? Migraines   ? with photo and phonophobia  ? Multiple thyroid nodules   ? Plantar fasciitis   ? L foot, injected at foot center 2014  ? Salmonella enteritis 2011  ? stool culture positive  ? ? ?Past Surgical History:  ?Procedure Laterality Date  ? CHOLECYSTECTOMY    ? TONSILLECTOMY    ? ? ?Prior to Admission medications   ?Medication Sig Start Date End Date Taking? Authorizing Provider  ?Blood Glucose Calibration (CONTOUR NEXT CONTROL) Normal SOLN Use to check controls on glucose meter. 02/22/20  Yes Tonia Ghent, MD  ?Blood Glucose Monitoring Suppl (CONTOUR NEXT MONITOR) w/Device KIT Use to check blood sugar up to four times daily as directed. (FOR ICD-10 E11.9). Non insulin dependent. 02/22/20  Yes Tonia Ghent, MD  ?citalopram (CELEXA) 10 MG tablet Take 1 tablet (10 mg total) by mouth daily. 09/09/21  Yes Tonia Ghent, MD  ?cyclobenzaprine (FLEXERIL) 10 MG tablet Take 1 tablet (10 mg total) by mouth 3 (three) times daily as needed for muscle spasms (sedation caution). 08/31/20  Yes Tonia Ghent, MD  ?gabapentin (NEURONTIN) 100 MG capsule Take 1-2 capsules (100-200 mg total) by mouth 3 (three) times daily. 03/04/21  Yes Tonia Ghent, MD  ?glimepiride (AMARYL) 2 MG tablet Take 1 tablet (2 mg total) by mouth daily before breakfast. 09/09/21  Yes Tonia Ghent, MD  ?glucose blood (CONTOUR NEXT TEST) test strip Use to check blood sugar up to four times daily as directed. (FOR ICD-10 E11.9). Non insulin  dependent. 02/22/20  Yes Tonia Ghent, MD  ?Lancet Devices (MICROLET NEXT LANCING DEVICE) MISC Use to check blood sugar up to four times daily as directed. (FOR ICD-10 E11.9). Non insulin dependent 02/22/20  Yes Tonia Ghent, MD  ?metFORMIN (GLUCOPHAGE) 500 MG tablet TAKE 1 TO 2 TABLETS BY  MOUTH TWICE DAILY WITH  MEALS 07/01/21  Yes Tonia Ghent, MD  ?Omega-3 Fatty Acids (FISH OIL) 1000 MG CAPS Take 1 capsule (1,000 mg total) by mouth in the morning and at bedtime. 09/09/21  Yes Tonia Ghent, MD  ?OZEMPIC, 0.25 OR 0.5 MG/DOSE, 2 MG/1.5ML SOPN INJECT 0.5 MG SUBCUTANEOUSLY AS  DIRECTED WEEKLY 09/10/21  Yes Tonia Ghent, MD  ?OZEMPIC, 0.25 OR 0.5 MG/DOSE, 2 MG/3ML SOPN Inject into the skin. 09/10/21  Yes [provider]  ?pantoprazole (PROTONIX) 20 MG tablet Take 1 tablet (20 mg total) by mouth daily. 09/09/21  Yes Tonia Ghent, MD  ?scopolamine (TRANSDERM-SCOP) 1 MG/3DAYS Place 1 patch (1.5 mg total) onto the skin every 3 (three) days. 09/09/21  Yes Tonia Ghent, MD  ?hydrOXYzine (ATARAX) 25 MG tablet Take 0.5-1 tablets (12.5-25 mg total) by mouth 2 (two) times daily as needed for anxiety (sedation precautions). ?Patient not taking: Reported on 09/09/2021 05/27/21   Ria Bush, MD  ? ? ?Allergies as of 09/10/2021 - Review Complete 09/10/2021  ?Allergen Reaction Noted  ?  Ace inhibitors  04/19/2018  ? Angiotensin receptor blockers  04/19/2018  ? Penicillins    ? Pravastatin  03/04/2021  ? ? ?Family History  ?Problem Relation Age of Onset  ? Hypertension Mother   ? Depression Mother   ?     bipolar  ? Diabetes Mother   ?     diet controlled  ? COPD Mother   ? Thyroid disease Mother   ? Diabetes Father   ? Thyroid disease Father   ? Heart disease Father   ? Thyroid disease Brother   ? Hypertension Brother   ? Diabetes Brother   ? Colon cancer Neg Hx   ? Prostate cancer Neg Hx   ? ? ?Social History  ? ?Socioeconomic History  ? Marital status: Divorced  ?  Spouse name: Not on file  ? Number of  children: 1  ? Years of education: Not on file  ? Highest education level: Not on file  ?Occupational History  ? 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 Smoking status: Never  ? Smokeless tobacco: Never  ?Vaping Use  ? Vaping Use: Never used  ?Substance and Sexual Activity  ? Alcohol use: No  ?  Alcohol/week: 0.0 standard drinks  ?  Comment: occassionally  ? Drug use: No  ? Sexual activity: Not on file  ?Other Topics Concern  ? Not on file  ?Social History Narrative  ? From [place], in [place] since 1996  ? Divorced x 2, 1 daughter with little contact.  ? Working at [employer]  ? Living with brother as of 2022  ? ?Social Determinants of Health  ? ?Financial Resource Strain: Not on file  ?Food Insecurity: Not on file  ?Transportation Needs: Not on file  ?Physical Activity: Not on file  ?Stress: Not on file  ?Social Connections: Not on file  ?Intimate Partner Violence: Not on file  ? ? ?Review of Systems: ?See HPI, otherwise negative ROS ? ?Physical Exam: ?BP (!) 142/87   Pulse 93   Temp (!) 97.4 ?F (36.3 ?C) (Temporal)   Resp 20   Ht 5' 10"  (1.778 m)   Wt (!) 145.2 kg   SpO2 98%   BMI 45.92 kg/m?  ?General:   Alert,  pleasant and cooperative in NAD ?Head:  Normocephalic and atraumatic. ?Neck:  Supple; no masses or thyromegaly. ?Lungs:  Clear throughout to auscultation.    ?Heart:  Regular rate and rhythm. ?Abdomen:  Soft, nontender and nondistended. Normal bowel sounds, without guarding, and without rebound.   ?Neurologic:  Alert and  oriented x4;  grossly normal neurologically. ? ?Impression/Plan: ?Kyle Murphy is here for an colonoscopy to be performed for colon cancer screening ? ?Risks, benefits, limitations, and alternatives regarding  colonoscopy have been reviewed with the patient.  Questions have been answered.  All parties agreeable. ? ? ?Sherri Sear, MD  10/11/2021, 9:22 AM ?

## 2021-10-11 NOTE — Anesthesia Procedure Notes (Signed)
Procedure Name: [place] ?Date/Time: 10/11/2021 10:25 AM ?Performed by: Jerrye Noble, CRNA ?Pre-anesthesia Checklist: Patient identified, Emergency Drugs available, Suction available and Patient being monitored ?Patient Re-evaluated:Patient Re-evaluated prior to induction ?Oxygen Delivery Method: Supernova nasal CPAP ? ? ? ? ?

## 2021-10-14 ENCOUNTER — Encounter: Payer: Self-pay | Admitting: Gastroenterology

## 2021-10-14 LAB — SURGICAL PATHOLOGY

## 2021-10-15 ENCOUNTER — Encounter: Payer: Self-pay | Admitting: Gastroenterology

## 2021-10-25 ENCOUNTER — Encounter: Payer: Self-pay | Admitting: Primary Care

## 2021-10-25 ENCOUNTER — Ambulatory Visit: Payer: BC Managed Care – PPO | Admitting: Primary Care

## 2021-10-25 VITALS — BP 140/82 | HR 83 | Temp 97.7°F | Ht 70.0 in | Wt 318.0 lb

## 2021-10-25 DIAGNOSIS — R0683 Snoring: Secondary | ICD-10-CM

## 2021-10-25 NOTE — Progress Notes (Signed)
@Patient  ID: Kyle Murphy, male    DOB: Apr 01, 1976, 47 y.o.   MRN: [id number]  Chief Complaint  Patient presents with   sleep consult    No prior sleep study. C/o loud snoring, daytime sleepiness and restless sleep.     Referring provider: Tonia Ghent, MD  HPI: 46 year old male, never smoked.  Past medical history significant for diabetes, GERD, fatty liver, rectal polyp, hyperlipidemia.  10/25/2021 Patient presents today for sleep consult. He has symptoms of snoring, restless sleep and daytime sleepiness. Frequently wakes up at night. Daytime sleepiness lasts all day. He drives fork lifts. He has never fallen asleep while driving. Typical bedtime is between 12:30am-1am. He is unsure how long it takes him to fall asleep. He wakes up 2-3 times at night. He starts his day at 9am. He has never had a sleep study before. Denies symptoms of narcolepsy, cataplexy or sleep walking. He is working on weight loss efforts. Epworth is 4/24.   Sleep questionnaire Symptoms- Loud snoring, restless sleep, daytime sleepiness  Prior sleep study- None Bedtime- 13:30am-1am Time to fall asleep- unsure Nocturnal awakenings- 2-3 times Out of bed/start of day- 9am Weight changes- up Do you operate heavy machinery- yes, drives a forklift and moves trucks Do you currently wear CPAP- No Do you current wear oxygen- No Epworth- 4   Allergies  Allergen Reactions   Ace Inhibitors     H/o lip swelling.    Angiotensin Receptor Blockers     H/o lip swelling.    Penicillins     Has patient had a PCN reaction causing immediate rash, facial/tongue/throat swelling, SOB or lightheadedness with hypotension: No Has patient had a PCN reaction causing severe rash involving mucus membranes or skin necrosis: No Has patient had a PCN reaction that required hospitalization: No Has patient had a PCN reaction occurring within the last 10 years: No If all of the above answers are "NO", then may proceed with  Cephalosporin use.    Pravastatin     GI upset.     Immunization History  Administered Date(s) Administered   Influenza Split 04/01/2011, 04/01/2012, 01/26/2018   Influenza Whole 03/28/2010   Influenza,inj,Quad PF,6+ Mos 03/24/2014, 03/23/2015, 03/28/2016, 02/27/2017, 01/26/2018, 01/28/2019, 06/20/2020, 03/04/2021   PFIZER(Purple Top)SARS-COV-2 Vaccination 09/19/2019, 10/10/2019, 05/04/2020   Pfizer Covid-19 Vaccine Bivalent Booster 5y-11y 03/20/2021   Pneumococcal Polysaccharide-23 10/22/2017   Td 03/28/2010   Tdap 08/15/2016    Past Medical History:  Diagnosis Date   Anxiety    Diabetes ([place])    History of heartburn    HLD (hyperlipidemia)    Leg cramps    history of   Migraines    with photo and phonophobia   Multiple thyroid nodules    Plantar fasciitis    L foot, injected at [facility] center 2014   Salmonella enteritis 2011   stool culture positive    Tobacco History: Social History   Tobacco Use  Smoking Status Never  Smokeless Tobacco Never   Counseling given: Not Answered   Outpatient Medications Prior to Visit  Medication Sig Dispense Refill   Blood Glucose Calibration (CONTOUR NEXT CONTROL) Normal SOLN Use to check controls on glucose meter. 1 each 3   Blood Glucose Monitoring Suppl (CONTOUR NEXT MONITOR) w/Device KIT Use to check blood sugar up to four times daily as directed. (FOR ICD-10 E11.9). Non insulin dependent. 1 kit 0   citalopram (CELEXA) 10 MG tablet Take 1 tablet (10 mg total) by mouth daily. 90 tablet 3  cyclobenzaprine (FLEXERIL) 10 MG tablet Take 1 tablet (10 mg total) by mouth 3 (three) times daily as needed for muscle spasms (sedation caution). 30 tablet 1   gabapentin (NEURONTIN) 100 MG capsule Take 1-2 capsules (100-200 mg total) by mouth 3 (three) times daily. 90 capsule 3   glimepiride (AMARYL) 2 MG tablet Take 1 tablet (2 mg total) by mouth daily before breakfast. 90 tablet 3   glucose blood (CONTOUR NEXT TEST) test strip Use to check  blood sugar up to four times daily as directed. (FOR ICD-10 E11.9). Non insulin dependent. 100 each 11   hydrOXYzine (ATARAX) 25 MG tablet Take 0.5-1 tablets (12.5-25 mg total) by mouth 2 (two) times daily as needed for anxiety (sedation precautions). 30 tablet 1   Lancet Devices (MICROLET NEXT LANCING DEVICE) MISC Use to check blood sugar up to four times daily as directed. (FOR ICD-10 E11.9). Non insulin dependent 1 each 0   metFORMIN (GLUCOPHAGE) 500 MG tablet TAKE 1 TO 2 TABLETS BY  MOUTH TWICE DAILY WITH  MEALS 360 tablet 3   Omega-3 Fatty Acids (FISH OIL) 1000 MG CAPS Take 1 capsule (1,000 mg total) by mouth in the morning and at bedtime.     OZEMPIC, 0.25 OR 0.5 MG/DOSE, 2 MG/1.5ML SOPN INJECT 0.5 MG SUBCUTANEOUSLY AS  DIRECTED WEEKLY 4.5 mL 3   OZEMPIC, 0.25 OR 0.5 MG/DOSE, 2 MG/3ML SOPN Inject into the skin.     pantoprazole (PROTONIX) 20 MG tablet Take 1 tablet (20 mg total) by mouth daily. 90 tablet 3   scopolamine (TRANSDERM-SCOP) 1 MG/3DAYS Place 1 patch (1.5 mg total) onto the skin every 3 (three) days. 4 patch 1   No facility-administered medications prior to visit.    Review of Systems  Review of Systems  Constitutional:  Positive for fatigue.  HENT: Negative.    Respiratory: Negative.    Psychiatric/Behavioral:  Positive for sleep disturbance.     Physical Exam  BP 140/82 (BP Location: Left Arm, Cuff Size: Large)   Pulse 83   Temp 97.7 F (36.5 C) (Temporal)   Ht 5' 10"  (1.778 m)   Wt (!) 318 lb (144.2 kg)   SpO2 97%   BMI 45.63 kg/m  Physical Exam Constitutional:      Appearance: Normal appearance.  HENT:     Head: Normocephalic and atraumatic.     Mouth/Throat:     Mouth: Mucous membranes are moist.     Pharynx: Oropharynx is clear.     Comments: Mallampati class III Cardiovascular:     Rate and Rhythm: Normal rate and regular rhythm.  Pulmonary:     Effort: Pulmonary effort is normal.     Breath sounds: Rhonchi present. No wheezing or rales.   Abdominal:     Comments: Large abd  Musculoskeletal:        General: Normal range of motion.  Skin:    General: Skin is warm and dry.  Neurological:     General: No focal deficit present.     Mental Status: He is alert and oriented to person, place, and time. Mental status is at baseline.  Psychiatric:        Mood and Affect: Mood normal.        Behavior: Behavior normal.        Thought Content: Thought content normal.        Judgment: Judgment normal.     Lab Results:  CBC    Component Value Date/Time   WBC 11.2 (H) 07/14/2017  1339   RBC 5.00 07/14/2017 1339   HGB 15.0 07/14/2017 1339   HCT 45.0 07/14/2017 1339   PLT 256 07/14/2017 1339   MCV 89.9 07/14/2017 1339   MCH 29.9 07/14/2017 1339   MCHC 33.3 07/14/2017 1339   RDW 14.1 07/14/2017 1339   LYMPHSABS 2.1 07/14/2017 1339   MONOABS 0.9 07/14/2017 1339   EOSABS 0.2 07/14/2017 1339   BASOSABS 0.1 07/14/2017 1339    BMET    Component Value Date/Time   NA 141 09/02/2021 0747   K 3.9 09/02/2021 0747   CL 105 09/02/2021 0747   CO2 30 09/02/2021 0747   GLUCOSE 123 (H) 09/02/2021 0747   BUN 12 09/02/2021 0747   BUN 7 08/10/2017 1040   CREATININE 0.77 09/02/2021 0747   CREATININE 0.91 07/08/2012 0000   CALCIUM 8.4 09/02/2021 0747   GFRNONAA 114 08/10/2017 1040   GFRAA 132 08/10/2017 1040    BNP No results found for: BNP  ProBNP No results found for: PROBNP  Imaging: No results found.   Assessment & Plan:   Loud snoring -Patient has symptoms of loud snoring, restless sleep and daytime sleepiness.  Epworth 4 . BMI 45.  Concern patient could have obstructive sleep apnea, needs home sleep study to evaluate. Discussed risk of untreated sleep apnea including cardiac arrhythmias, pulm HTN, stroke, DM. We briefly reviewed treatment options. Encouraged patient to work on weight loss efforts and focus on side sleeping position/elevate head of bed. Advised against driving if experiencing excessive daytime  sleepiness. Follow-up in 4-6 weeks to review sleep study results and discuss treatment options further   Martyn Ehrich, NP 10/25/2021

## 2021-10-25 NOTE — Patient Instructions (Addendum)
  Sleep apnea is defined as period of 10 seconds or longer when you stop breathing at night. This can happen multiple times a night. Dx sleep apnea is when this occurs more than 5 times an hour.    Mild OSA 5-15 apneic events an hour Moderate OSA 15-30 apneic events an hour Severe OSA > 30 apneic events an hour   Untreated sleep apnea puts you at higher risk for cardiac arrhythmias, pulmonary HTN, stroke and diabetes   Treatment options include weight loss, side sleeping position, oral appliance, CPAP therapy or referral to ENT for possible surgical options    Recommendations: Focus on side sleeping position Work on weight loss efforts  Do not drive if experiencing excessive daytime sleepiness of fatigue    Orders: Home sleep study re: loud snoring    Follow-up: 6 week virtual visit with Beth NP to review sleep study results and treatment if needed

## 2021-10-25 NOTE — Assessment & Plan Note (Signed)
-  Patient has symptoms of loud snoring, restless sleep and daytime sleepiness.  Epworth 4 . BMI 45.  Concern patient could have obstructive sleep apnea, needs home sleep study to evaluate. Discussed risk of untreated sleep apnea including cardiac arrhythmias, pulm HTN, stroke, DM. We briefly reviewed treatment options. Encouraged patient to work on weight loss efforts and focus on side sleeping position/elevate head of bed. Advised against driving if experiencing excessive daytime sleepiness. Follow-up in 4-6 weeks to review sleep study results and discuss treatment options further

## 2021-10-25 NOTE — Progress Notes (Signed)
Reviewed and agree with assessment/plan.   Leetta Hendriks, MD [facility] 10/25/2021, 1:25 PM Pager:  [phone]  

## 2021-11-03 ENCOUNTER — Other Ambulatory Visit: Payer: Self-pay | Admitting: Family Medicine

## 2021-11-03 DIAGNOSIS — E119 Type 2 diabetes mellitus without complications: Secondary | ICD-10-CM

## 2021-11-07 ENCOUNTER — Ambulatory Visit: Payer: BC Managed Care – PPO | Admitting: Family Medicine

## 2021-11-07 ENCOUNTER — Encounter: Payer: Self-pay | Admitting: Family Medicine

## 2021-11-07 ENCOUNTER — Ambulatory Visit (INDEPENDENT_AMBULATORY_CARE_PROVIDER_SITE_OTHER)
Admission: RE | Admit: 2021-11-07 | Discharge: 2021-11-07 | Disposition: A | Discharge status: Home / Self Care | Payer: BC Managed Care – PPO | Source: Ambulatory Visit | Attending: Family Medicine | Admitting: Family Medicine

## 2021-11-07 VITALS — BP 140/76 | HR 83 | Temp 98.2°F | Ht 70.0 in | Wt 334.0 lb

## 2021-11-07 DIAGNOSIS — R0602 Shortness of breath: Secondary | ICD-10-CM

## 2021-11-07 DIAGNOSIS — R600 Localized edema: Secondary | ICD-10-CM

## 2021-11-07 DIAGNOSIS — M7989 Other specified soft tissue disorders: Secondary | ICD-10-CM | POA: Diagnosis not present

## 2021-11-07 DIAGNOSIS — I517 Cardiomegaly: Secondary | ICD-10-CM | POA: Diagnosis not present

## 2021-11-07 DIAGNOSIS — M170 Bilateral primary osteoarthritis of knee: Secondary | ICD-10-CM | POA: Diagnosis not present

## 2021-11-07 DIAGNOSIS — M1711 Unilateral primary osteoarthritis, right knee: Secondary | ICD-10-CM | POA: Diagnosis not present

## 2021-11-07 LAB — HEPATIC FUNCTION PANEL
ALT: 30 U/L (ref 0–53)
AST: 18 U/L (ref 0–37)
Albumin: 3 g/dL — ABNORMAL LOW (ref 3.5–5.2)
Alkaline Phosphatase: 52 U/L (ref 39–117)
Bilirubin, Direct: 0 mg/dL (ref 0.0–0.3)
Total Bilirubin: 0.3 mg/dL (ref 0.2–1.2)
Total Protein: 6.1 g/dL (ref 6.0–8.3)

## 2021-11-07 LAB — CBC WITH DIFFERENTIAL/PLATELET
Basophils Absolute: 0.1 10*3/uL (ref 0.0–0.1)
Basophils Relative: 0.9 % (ref 0.0–3.0)
Eosinophils Absolute: 0.2 10*3/uL (ref 0.0–0.7)
Eosinophils Relative: 2.9 % (ref 0.0–5.0)
HCT: 44.6 % (ref 39.0–52.0)
Hemoglobin: 14.9 g/dL (ref 13.0–17.0)
Lymphocytes Relative: 22 % (ref 12.0–46.0)
Lymphs Abs: 1.8 10*3/uL (ref 0.7–4.0)
MCHC: 33.4 g/dL (ref 30.0–36.0)
MCV: 90.8 fl (ref 78.0–100.0)
Monocytes Absolute: 0.6 10*3/uL (ref 0.1–1.0)
Monocytes Relative: 7.4 % (ref 3.0–12.0)
Neutro Abs: 5.6 10*3/uL (ref 1.4–7.7)
Neutrophils Relative %: 66.8 % (ref 43.0–77.0)
Platelets: 237 10*3/uL (ref 150.0–400.0)
RBC: 4.91 Mil/uL (ref 4.22–5.81)
RDW: 14.2 % (ref 11.5–15.5)
WBC: 8.3 10*3/uL (ref 4.0–10.5)

## 2021-11-07 LAB — MICROALBUMIN / CREATININE URINE RATIO
Creatinine,U: 81.1 mg/dL
Microalb Creat Ratio: 365.5 mg/g — ABNORMAL HIGH (ref 0.0–30.0)
Microalb, Ur: 296.5 mg/dL — ABNORMAL HIGH (ref 0.0–1.9)

## 2021-11-07 LAB — BASIC METABOLIC PANEL
BUN: 12 mg/dL (ref 6–23)
CO2: 32 mEq/L (ref 19–32)
Calcium: 8.7 mg/dL (ref 8.4–10.5)
Chloride: 102 mEq/L (ref 96–112)
Creatinine, Ser: 0.78 mg/dL (ref 0.40–1.50)
GFR: 106.97 mL/min (ref >60.00)
Glucose, Bld: 170 mg/dL — ABNORMAL HIGH (ref 70–99)
Potassium: 4.2 mEq/L (ref 3.5–5.1)
Sodium: 140 mEq/L (ref 135–145)

## 2021-11-07 LAB — BRAIN NATRIURETIC PEPTIDE: Pro B Natriuretic peptide (BNP): 14 pg/mL (ref 0.0–100.0)

## 2021-11-07 MED ORDER — FUROSEMIDE 20 MG PO TABS: 20.0000 mg | ORAL_TABLET | Freq: Every day | ORAL | 1 refills | Status: DC

## 2021-11-07 NOTE — Progress Notes (Signed)
Arvon Schreiner T. Lizeth Bencosme, MD, [place] at [facility] [place], [address]  Phone: [phone]  FAX: [place] - 46 y.o. male  MRN [id number]  Date of Birth: February 08, 1976  Date: 11/07/2021  PCP: Tonia Ghent, MD  Referral: Tonia Ghent, MD  Chief Complaint  Patient presents with   Leg Swelling    Bilateral   Foot Swelling    Bilateral   Subjective:   Kyle Murphy is a 46 y.o. very pleasant male patient with Body mass index is 47.92 kg/m. who presents with the following:  B foot and ankle swelling.  New onset 3-4+ lower extremity edema.  While I do not have a longstanding relationship with the patient, he is a good historian, he relates that he is having bilateral new onset lower extremity edema that is very different compared to his baseline.  He also has a 16 pound weight gain in the last 13 days.  No known cardiac dysfunction, heart failure and this is with a recent echo in 2017, as well as grossly normal labs with the exception of a fairly increased microalbumin to creatinine ratio from several months ago.  He is currently not having any chest pain. Globally he is not in good shape and he does get short of breath with exercise, but this is nothing new at all.  2017 echo normal 2017 ETT normal Recent LFT's normal Incr microalbumin-cr ration 2 months ago.  Almost 20 pound weight gain - a couple of weeks.  Wt Readings from Last 3 Encounters:  11/07/21 (!) 334 lb (151.5 kg)  10/25/21 (!) 318 lb (144.2 kg)  10/11/21 (!) 320 lb (145.2 kg)     Review of Systems is noted in the HPI, as appropriate  Objective:   BP 140/76   Pulse 83   Temp 98.2 F (36.8 C) (Oral)   Ht 5' 10"  (1.778 m)   Wt (!) 334 lb (151.5 kg)   SpO2 94%   BMI 47.92 kg/m   GEN: No acute distress; alert,appropriate. PULM: Breathing comfortably in no respiratory distress PSYCH: Normally interactive.   CV: RRR, no m/g/r  PULM: Normal respiratory rate, no accessory muscle use. No wheezes, crackles or rhonchi        Laboratory and Imaging Data: Lab Review:     Latest Ref Rng & Units 07/14/2017    1:39 PM  CBC EXTENDED  WBC 3.8 - 10.6 K/uL 11.2    RBC 4.40 - 5.90 MIL/uL 5.00    Hemoglobin 13.0 - 18.0 g/dL 15.0    HCT 40.0 - 52.0 % 45.0    Platelets 150 - 440 K/uL 256    NEUT# 1.4 - 6.5 K/uL 7.8    Lymph# 1.0 - 3.6 K/uL 2.1         Latest Ref Rng & Units 09/02/2021    7:47 AM 08/14/2020    9:18 AM 08/10/2019    8:41 AM  BMP  Glucose 70 - 99 mg/dL 123   120   169    BUN 6 - 23 mg/dL 12   10   13     Creatinine 0.40 - 1.50 mg/dL 0.77   0.85   0.79    Sodium 135 - 145 mEq/L 141   139   137    Potassium 3.5 - 5.1 mEq/L 3.9   4.5   4.0    Chloride 96 - 112 mEq/L 105  101   99    CO2 19 - 32 mEq/L 30   32   33    Calcium 8.4 - 10.5 mg/dL 8.4   9.1   9.0         Latest Ref Rng & Units 09/02/2021    7:47 AM 08/14/2020    9:18 AM 08/10/2019    8:41 AM  Hepatic Function  Total Protein 6.0 - 8.3 g/dL 5.9   6.8   7.2    Albumin 3.5 - 5.2 g/dL 2.9   3.8   3.8    AST 0 - 37 U/L 15   18   15     ALT 0 - 53 U/L 24   26   26     Alk Phosphatase 39 - 117 U/L 51   62   65    Total Bilirubin 0.2 - 1.2 mg/dL 0.2   0.6   0.5      Lab Results  Component Value Date   CHOL 220 (H) 09/02/2021   Lab Results  Component Value Date   HDL 31.80 (L) 09/02/2021   Lab Results  Component Value Date   LDLCALC 106 (H) 08/14/2020   Lab Results  Component Value Date   TRIG (H) 09/02/2021    585.0 Triglyceride is over 400; calculations on Lipids are invalid.   Lab Results  Component Value Date   CHOLHDL 7 09/02/2021   No results for input(s): PSA in the last 72 hours. No results found for: HCVAB No results found for: VD25OH   Lab Results  Component Value Date   HGBA1C 7.0 (H) 09/02/2021   HGBA1C 6.9 (A) 06/17/2021   HGBA1C 9.9 (A) 03/04/2021   Lab Results  Component Value Date    MICROALBUR 261.6 (H) 09/02/2021   LDLCALC 106 (H) 08/14/2020   CREATININE 0.77 09/02/2021     Assessment and Plan:     ICD-10-CM   1. Lower extremity edema  R60.0 Brain natriuretic peptide    Basic metabolic panel    Hepatic function panel    Microalbumin / creatinine urine ratio    CBC with Differential/Platelet    DG Chest 2 View    2. Foot swelling  M79.89 Brain natriuretic peptide    Basic metabolic panel    Hepatic function panel    Microalbumin / creatinine urine ratio    CBC with Differential/Platelet    DG Chest 2 View    3. Shortness of breath  R06.02 Brain natriuretic peptide    Basic metabolic panel    Hepatic function panel    Microalbumin / creatinine urine ratio    CBC with Differential/Platelet    DG Chest 2 View     Concerning, multiple etiologies are present.  Assess cardiac BNP, assess renal as well as liver function.  I will also get a chest x-ray as well as a blood count.  Multiple organ systems are potentially possible.  Am going to give the patient some Lasix today, and we will need to address additional evaluation based on current findings.  Also communicated this face-to-face with the patient's primary care doctor.  Medication Management during today's office visit: Meds ordered this encounter  Medications   furosemide (LASIX) 20 MG tablet    Sig: Take 1 tablet (20 mg total) by mouth daily.    Dispense:  30 tablet    Refill:  1   There are no discontinued medications.  Orders placed today for conditions managed today: Orders Placed This  Encounter  Procedures   DG Chest 2 View   Brain natriuretic peptide   Basic metabolic panel   Hepatic function panel   Microalbumin / creatinine urine ratio   CBC with Differential/Platelet    Follow-up if needed: No follow-ups on file.  Dragon [employer] One speech-to-text software was used for transcription in this dictation.  Possible transcriptional errors can occur using Editor, commissioning.    Signed,  Maud Deed. Chassity Ludke, MD   Outpatient Encounter Medications as of 11/07/2021  Medication Sig   Blood Glucose Calibration (CONTOUR NEXT CONTROL) Normal SOLN USE TO CHECK CONTROLS ON  GLUCOSE METER   Blood Glucose Monitoring Suppl (CONTOUR NEXT MONITOR) w/Device KIT Use to check blood sugar up to four times daily as directed. (FOR ICD-10 E11.9). Non insulin dependent.   citalopram (CELEXA) 10 MG tablet Take 1 tablet (10 mg total) by mouth daily.   cyclobenzaprine (FLEXERIL) 10 MG tablet Take 1 tablet (10 mg total) by mouth 3 (three) times daily as needed for muscle spasms (sedation caution).   furosemide (LASIX) 20 MG tablet Take 1 tablet (20 mg total) by mouth daily.   gabapentin (NEURONTIN) 100 MG capsule Take 1-2 capsules (100-200 mg total) by mouth 3 (three) times daily.   glimepiride (AMARYL) 2 MG tablet Take 1 tablet (2 mg total) by mouth daily before breakfast.   glucose blood (CONTOUR NEXT TEST) test strip USE TO CHECK BLOOD SUGAR UP TO 4 TIMES DAILY AS  DIRECTED   hydrOXYzine (ATARAX) 25 MG tablet Take 0.5-1 tablets (12.5-25 mg total) by mouth 2 (two) times daily as needed for anxiety (sedation precautions).   Lancet Devices (MICROLET NEXT LANCING DEVICE) MISC USE TO CHECK BLOOD SUGAR UP TO 4 TIMES DAILY AS  DIRECTED   metFORMIN (GLUCOPHAGE) 500 MG tablet TAKE 1 TO 2 TABLETS BY  MOUTH TWICE DAILY WITH  MEALS   Omega-3 Fatty Acids (FISH OIL) 1000 MG CAPS Take 1 capsule (1,000 mg total) by mouth in the morning and at bedtime.   OZEMPIC, 0.25 OR 0.5 MG/DOSE, 2 MG/1.5ML SOPN INJECT 0.5 MG SUBCUTANEOUSLY AS  DIRECTED WEEKLY   OZEMPIC, 0.25 OR 0.5 MG/DOSE, 2 MG/3ML SOPN Inject into the skin.   pantoprazole (PROTONIX) 20 MG tablet Take 1 tablet (20 mg total) by mouth daily.   scopolamine (TRANSDERM-SCOP) 1 MG/3DAYS Place 1 patch (1.5 mg total) onto the skin every 3 (three) days.   No facility-administered encounter medications on file as of 11/07/2021.

## 2021-11-07 NOTE — Patient Instructions (Signed)
For the next 2 days, take one twice a day.  Then go to once a day.  We need more information to figure what to do long-term.

## 2021-11-12 ENCOUNTER — Other Ambulatory Visit: Payer: Self-pay | Admitting: Family Medicine

## 2021-11-14 ENCOUNTER — Ambulatory Visit: Payer: BC Managed Care – PPO | Admitting: Family Medicine

## 2021-11-14 ENCOUNTER — Encounter: Payer: Self-pay | Admitting: Family Medicine

## 2021-11-14 VITALS — BP 130/78 | HR 77 | Temp 97.3°F | Ht 70.0 in | Wt 328.1 lb

## 2021-11-14 DIAGNOSIS — R809 Proteinuria, unspecified: Secondary | ICD-10-CM

## 2021-11-14 LAB — URINALYSIS, ROUTINE W REFLEX MICROSCOPIC
Bilirubin Urine: NEGATIVE
Hgb urine dipstick: NEGATIVE
Ketones, ur: NEGATIVE
Leukocytes,Ua: NEGATIVE
Nitrite: NEGATIVE
RBC / HPF: NONE SEEN (ref 0–?)
Specific Gravity, Urine: 1.01 (ref 1.000–1.030)
Total Protein, Urine: 100 — AB
Urine Glucose: NEGATIVE
Urobilinogen, UA: 0.2 (ref 0.0–1.0)
WBC, UA: NONE SEEN (ref 0–?)
pH: 6.5 (ref 5.0–8.0)

## 2021-11-14 LAB — COMPREHENSIVE METABOLIC PANEL
ALT: 31 U/L (ref 0–53)
AST: 21 U/L (ref 0–37)
Albumin: 3.1 g/dL — ABNORMAL LOW (ref 3.5–5.2)
Alkaline Phosphatase: 53 U/L (ref 39–117)
BUN: 8 mg/dL (ref 6–23)
CO2: 29 mEq/L (ref 19–32)
Calcium: 8.7 mg/dL (ref 8.4–10.5)
Chloride: 101 mEq/L (ref 96–112)
Creatinine, Ser: 0.75 mg/dL (ref 0.40–1.50)
GFR: 108.23 mL/min (ref >60.00)
Glucose, Bld: 140 mg/dL — ABNORMAL HIGH (ref 70–99)
Potassium: 3.9 mEq/L (ref 3.5–5.1)
Sodium: 138 mEq/L (ref 135–145)
Total Bilirubin: 0.3 mg/dL (ref 0.2–1.2)
Total Protein: 6.3 g/dL (ref 6.0–8.3)

## 2021-11-14 MED ORDER — FUROSEMIDE 20 MG PO TABS: 20.0000 mg | ORAL_TABLET | Freq: Every day | ORAL | Status: DC

## 2021-11-14 MED ORDER — METFORMIN HCL 500 MG PO TABS: 1000.0000 mg | ORAL_TABLET | Freq: Two times a day (BID) | ORAL | Status: DC

## 2021-11-14 NOTE — Patient Instructions (Addendum)
Go to the lab on the way out.   If you have mychart we'll likely use that to update you.     For the next 3 days, take '40mg'$  of lasix (2 of the '20mg'$  tabs at the same time). If you are lightheaded then cut back to '20mg'$  a day.    Update me about your swelling next week.  We'll go from there.    Take care.  Glad to see you.

## 2021-11-14 NOTE — Progress Notes (Signed)
F/u re: edema.  Weight 334----->328 today since starting Lasix.  Inc UOP with lasix '20mg'$  daily.    He noted recent BLE edema, that led to prev OV.  Still with BLE edema now.  No CP.  Nasal congestion but not SOB unless sig exertion and that is stable.   Sleep testing is pending.   Discussed with patient.  Recent labs and differential diagnosis for lower extremity edema discussed with patient.  Meds, vitals, and allergies reviewed.   ROS: Per HPI unless specifically indicated in ROS section   GEN: nad, alert and oriented HEENT: ncat NECK: supple w/o LA CV: rrr.  no murmur PULM: ctab, no inc wob ABD: soft, +bs EXT: 2-3+ BLE edema.   SKIN: no acute rash

## 2021-11-15 ENCOUNTER — Other Ambulatory Visit: Payer: Self-pay | Admitting: Family Medicine

## 2021-11-15 DIAGNOSIS — R809 Proteinuria, unspecified: Secondary | ICD-10-CM | POA: Insufficient documentation

## 2021-11-15 DIAGNOSIS — R0609 Other forms of dyspnea: Secondary | ICD-10-CM

## 2021-11-15 NOTE — Assessment & Plan Note (Signed)
He could have proteinuria related to diabetes but is also possible that he could have a separate issue causing lower extremity edema and increasing proteinuria.  Discussed with patient.  His weight is better in the meantime.  He noted increased urine output with Lasix.  Discussed options.  We talked about potential referral to renal versus cardiology versus both.  I would recheck his labs first and go from there.  In the meantime and for the next 3 days, take '40mg'$  of lasix (2 of the '20mg'$  tabs at the same time). If lightheaded then cut back to '20mg'$  a day.    I asked him to update me about his swelling next week.

## 2021-11-18 ENCOUNTER — Telehealth: Payer: Self-pay

## 2021-11-18 DIAGNOSIS — R809 Proteinuria, unspecified: Secondary | ICD-10-CM

## 2021-11-18 NOTE — Telephone Encounter (Signed)
Patient is calling in stating that his weight is 326 LB, and his swelling has not improved.

## 2021-11-19 NOTE — Telephone Encounter (Signed)
Called patient and advised about medication. Lab appt has been made for Friday at 10:00 am and will update [place] about his weight then.   Ashtyn, can you check on this referral please?

## 2021-11-19 NOTE — Addendum Note (Signed)
Addended by: Tonia Ghent on: 11/19/2021 08:04 AM   Modules accepted: Orders

## 2021-11-19 NOTE — Telephone Encounter (Signed)
Please see when he can see renal.  Referral is in.   I would try taking lasix '40mg'$  BID for 3 days and recheck BMET in at the end of the week.  Please update me about his weight at that point.  I put in the order for labs.  Needs to be scheduled.  Thanks.

## 2021-11-21 NOTE — Telephone Encounter (Signed)
Notes are in the referral in Epic  External Referral 11/18/2021 Referral #[id number]  Referred By/To  [facility]  Referred By  [place]  REF45 - AMB REFERRAL TO [place], Macaria Bias M, CMA to Patient  11/18/21 09:59 AM Communication Management- [place] [[id number]] (mychart letter)  11/19/2307:59:12 AM Virl Cagey, CMA Sent to [facility] in [facility] letter sent to patient making aware of where to call to schedule appt

## 2021-11-21 NOTE — Telephone Encounter (Signed)
Called patient and informed him of referral information. He will call [facility] for appt.

## 2021-11-22 ENCOUNTER — Other Ambulatory Visit (INDEPENDENT_AMBULATORY_CARE_PROVIDER_SITE_OTHER): Payer: BC Managed Care – PPO

## 2021-11-22 DIAGNOSIS — R809 Proteinuria, unspecified: Secondary | ICD-10-CM | POA: Diagnosis not present

## 2021-11-22 DIAGNOSIS — Z1159 Encounter for screening for other viral diseases: Secondary | ICD-10-CM | POA: Diagnosis not present

## 2021-11-22 LAB — BASIC METABOLIC PANEL
BUN: 11 mg/dL (ref 6–23)
CO2: 31 mEq/L (ref 19–32)
Calcium: 8.4 mg/dL (ref 8.4–10.5)
Chloride: 103 mEq/L (ref 96–112)
Creatinine, Ser: 0.77 mg/dL (ref 0.40–1.50)
GFR: 107.36 mL/min (ref >60.00)
Glucose, Bld: 148 mg/dL — ABNORMAL HIGH (ref 70–99)
Potassium: 3.8 mEq/L (ref 3.5–5.1)
Sodium: 140 mEq/L (ref 135–145)

## 2021-11-22 MED ORDER — FUROSEMIDE 20 MG PO TABS: 20.0000 mg | ORAL_TABLET | Freq: Every day | ORAL | 3 refills | Status: DC

## 2021-11-22 NOTE — Telephone Encounter (Signed)
Called patient and advised on dose changes and instructions. Patient states he is almost out of medication and needs refilled. Okay to refill?

## 2021-11-22 NOTE — Addendum Note (Signed)
Addended by: Tonia Ghent on: 11/22/2021 03:17 PM   Modules accepted: Orders

## 2021-11-22 NOTE — Telephone Encounter (Signed)
Sent to [facility].  Thanks.

## 2021-11-22 NOTE — Telephone Encounter (Signed)
Patient came in for labs today and weight check. Patient weighed this am at 321 lbs and states yesterday was around 325 lbs.

## 2021-11-22 NOTE — Telephone Encounter (Addendum)
Noted.  I will await his labs.  In the meantime I would continue lasix '20mg'$  a day for now but if his weight is going back up then change back to '40mg'$  daily.  Thanks.

## 2021-11-25 LAB — HEPATITIS C ANTIBODY
Hepatitis C Ab: NONREACTIVE
SIGNAL TO CUT-OFF: 0.09 (ref <1.00)

## 2021-11-29 ENCOUNTER — Other Ambulatory Visit: Payer: Self-pay | Admitting: Family Medicine

## 2021-12-03 ENCOUNTER — Ambulatory Visit: Payer: BC Managed Care – PPO

## 2021-12-03 DIAGNOSIS — G4733 Obstructive sleep apnea (adult) (pediatric): Secondary | ICD-10-CM | POA: Diagnosis not present

## 2021-12-03 DIAGNOSIS — R0683 Snoring: Secondary | ICD-10-CM

## 2021-12-05 DIAGNOSIS — G4733 Obstructive sleep apnea (adult) (pediatric): Secondary | ICD-10-CM

## 2021-12-12 ENCOUNTER — Encounter: Payer: Self-pay | Admitting: Family Medicine

## 2021-12-12 ENCOUNTER — Ambulatory Visit (INDEPENDENT_AMBULATORY_CARE_PROVIDER_SITE_OTHER): Payer: BC Managed Care – PPO | Admitting: Family Medicine

## 2021-12-12 VITALS — BP 132/82 | HR 90 | Temp 98.0°F | Ht 70.0 in | Wt 322.0 lb

## 2021-12-12 DIAGNOSIS — E119 Type 2 diabetes mellitus without complications: Secondary | ICD-10-CM | POA: Diagnosis not present

## 2021-12-12 DIAGNOSIS — R809 Proteinuria, unspecified: Secondary | ICD-10-CM

## 2021-12-12 LAB — BASIC METABOLIC PANEL
BUN: 13 mg/dL (ref 6–23)
CO2: 28 mEq/L (ref 19–32)
Calcium: 8.9 mg/dL (ref 8.4–10.5)
Chloride: 104 mEq/L (ref 96–112)
Creatinine, Ser: 0.78 mg/dL (ref 0.40–1.50)
GFR: 106.9 mL/min (ref >60.00)
Glucose, Bld: 140 mg/dL — ABNORMAL HIGH (ref 70–99)
Potassium: 4 mEq/L (ref 3.5–5.1)
Sodium: 141 mEq/L (ref 135–145)

## 2021-12-12 LAB — HEMOGLOBIN A1C: Hgb A1c MFr Bld: 7.3 % — ABNORMAL HIGH (ref 4.6–6.5)

## 2021-12-12 MED ORDER — FUROSEMIDE 20 MG PO TABS: 40.0000 mg | ORAL_TABLET | Freq: Every day | ORAL | Status: DC

## 2021-12-12 NOTE — Patient Instructions (Signed)
Go to the lab on the way out.   If you have mychart we'll likely use that to update you.    I'll await your follow up notes.  In the meantime, try taking 3 of the lasix tabs daily.  If lightheaded, then cut back to 2 tabs a day.

## 2021-12-12 NOTE — Progress Notes (Signed)
Down 6 lbs in the last month.  Taking '40mg'$  lasix daily.  H/o proteinuria noted.  Foamy urine noted by patient.  Still with L>R BLE edema.    He has renal f/u pending with echo pending for 12/17/21.   He had home sleep test done.  He'll f/u with pulmonary 12/25/21.  Results d/w pt briefly.    Sugar not checked often.  Due for f/u A1c.    Meds, vitals, and allergies reviewed.   ROS: Per HPI unless specifically indicated in ROS section   GEN: nad, alert and oriented HEENT: ncat NECK: supple w/o LA CV: rrr.  PULM: ctab, no inc wob ABD: soft, +bs EXT: L>R BLE edema.   SKIN: no acute rash

## 2021-12-14 ENCOUNTER — Other Ambulatory Visit: Payer: Self-pay | Admitting: Family Medicine

## 2021-12-14 DIAGNOSIS — E119 Type 2 diabetes mellitus without complications: Secondary | ICD-10-CM

## 2021-12-15 NOTE — Assessment & Plan Note (Signed)
With decreased weight but still noted edema.  Discussed options.  I will await renal follow-up note. In the meantime, try taking 3 of the lasix tabs daily.  If lightheaded, then cut back to 2 tabs a day.   He agrees with plan.  See notes on labs.

## 2021-12-17 ENCOUNTER — Ambulatory Visit (INDEPENDENT_AMBULATORY_CARE_PROVIDER_SITE_OTHER): Payer: BC Managed Care – PPO

## 2021-12-17 DIAGNOSIS — R0609 Other forms of dyspnea: Secondary | ICD-10-CM | POA: Diagnosis not present

## 2021-12-17 DIAGNOSIS — R809 Proteinuria, unspecified: Secondary | ICD-10-CM

## 2021-12-17 LAB — ECHOCARDIOGRAM COMPLETE
AR max vel: 1.8 cm2
AV Area VTI: 1.97 cm2
AV Area mean vel: 1.71 cm2
AV Mean grad: 4 mmHg
AV Peak grad: 6.6 mmHg
Ao pk vel: 1.28 m/s
Area-P 1/2: 3.1 cm2
S' Lateral: 4 cm

## 2021-12-24 DIAGNOSIS — E663 Overweight: Secondary | ICD-10-CM | POA: Diagnosis not present

## 2021-12-24 DIAGNOSIS — R809 Proteinuria, unspecified: Secondary | ICD-10-CM | POA: Diagnosis not present

## 2021-12-24 DIAGNOSIS — K219 Gastro-esophageal reflux disease without esophagitis: Secondary | ICD-10-CM | POA: Diagnosis not present

## 2021-12-24 DIAGNOSIS — E785 Hyperlipidemia, unspecified: Secondary | ICD-10-CM | POA: Diagnosis not present

## 2021-12-25 ENCOUNTER — Encounter: Payer: Self-pay | Admitting: Primary Care

## 2021-12-25 ENCOUNTER — Ambulatory Visit: Payer: BC Managed Care – PPO | Admitting: Primary Care

## 2021-12-25 VITALS — BP 140/76 | HR 85 | Temp 97.8°F | Ht 70.0 in | Wt 326.2 lb

## 2021-12-25 DIAGNOSIS — R809 Proteinuria, unspecified: Secondary | ICD-10-CM

## 2021-12-25 DIAGNOSIS — G4733 Obstructive sleep apnea (adult) (pediatric): Secondary | ICD-10-CM

## 2021-12-25 DIAGNOSIS — G4730 Sleep apnea, unspecified: Secondary | ICD-10-CM | POA: Diagnosis not present

## 2021-12-25 DIAGNOSIS — R600 Localized edema: Secondary | ICD-10-CM | POA: Insufficient documentation

## 2021-12-25 DIAGNOSIS — E119 Type 2 diabetes mellitus without complications: Secondary | ICD-10-CM | POA: Diagnosis not present

## 2021-12-25 NOTE — Progress Notes (Signed)
@Patient  ID: Kyle Murphy, male    DOB: Sep 16, 1975, 46 y.o.   MRN: [id number]  Chief Complaint  Patient presents with   Follow-up    Review HST    Referring provider: Tonia Ghent, MD  HPI: 46 year old male, never smoked.  Past medical history significant for diabetes, GERD, fatty liver, rectal polyp, hyperlipidemia.  Previous [facility] pulmonary encounter: 10/25/2021 Patient presents today for sleep consult. He has symptoms of snoring, restless sleep and daytime sleepiness. Frequently wakes up at night. Daytime sleepiness lasts all day. He drives fork lifts. He has never fallen asleep while driving. Typical bedtime is between 12:30am-1am. He is unsure how long it takes him to fall asleep. He wakes up 2-3 times at night. He starts his day at 9am. He has never had a sleep study before. Denies symptoms of narcolepsy, cataplexy or sleep walking. He is working on weight loss efforts. Epworth is 4/24.   Sleep questionnaire Symptoms- Loud snoring, restless sleep, daytime sleepiness  Prior sleep study- None Bedtime- 13:30am-1am Time to fall asleep- unsure Nocturnal awakenings- 2-3 times Out of bed/start of day- 9am Weight changes- up Do you operate heavy machinery- yes, drives a forklift and moves trucks Do you currently wear CPAP- No Do you current wear oxygen- No Epworth- 4  12/25/2021- Interim hx  Patient presents today for sleep follow-up.  Patient has symptoms of loud snoring, restless sleep and daytime sleepiness.  He had home sleep study on 12/04/2021 that showed evidence of severe obstructive sleep apnea, AHI 32/hr with SPO2 low 77% (average 91%).  Discussed sleep study results and treatment options.  Recommending patient be started on CPAP therapy due to severity of OSA and daytime symptoms.  He is open to starting CPAP.  He is aware that he needs to lose weight.  He was seen by [facility] yesterday for proteinuria.  Diabetes felt to be poorly controlled.  He  has new leg swelling.  He is on Lasix with 1.5 fluid liter restriction and 2 g salt diet.   Allergies  Allergen Reactions   Ace Inhibitors     H/o lip swelling.    Angiotensin Receptor Blockers     H/o lip swelling.    Penicillins     Has patient had a PCN reaction causing immediate rash, facial/tongue/throat swelling, SOB or lightheadedness with hypotension: No Has patient had a PCN reaction causing severe rash involving mucus membranes or skin necrosis: No Has patient had a PCN reaction that required hospitalization: No Has patient had a PCN reaction occurring within the last 10 years: No If all of the above answers are "NO", then may proceed with Cephalosporin use.    Pravastatin     GI upset.     Immunization History  Administered Date(s) Administered   Influenza Split 04/01/2011, 04/01/2012, 01/26/2018   Influenza Whole 03/28/2010   Influenza,inj,Quad PF,6+ Mos 03/24/2014, 03/23/2015, 03/28/2016, 02/27/2017, 01/26/2018, 01/28/2019, 06/20/2020, 03/04/2021   PFIZER(Purple Top)SARS-COV-2 Vaccination 09/19/2019, 10/10/2019, 05/04/2020   Pfizer Covid-19 Vaccine Bivalent Booster 5y-11y 03/20/2021   Pneumococcal Polysaccharide-23 10/22/2017   Td 03/28/2010   Tdap 08/15/2016    Past Medical History:  Diagnosis Date   Anxiety    Diabetes ([place])    History of heartburn    HLD (hyperlipidemia)    Leg cramps    history of   Migraines    with photo and phonophobia   Multiple thyroid nodules    Plantar fasciitis    L foot, injected at [facility] 2014  Salmonella enteritis 2011   stool culture positive    Tobacco History: Social History   Tobacco Use  Smoking Status Never  Smokeless Tobacco Never   Counseling given: Not Answered   Outpatient Medications Prior to Visit  Medication Sig Dispense Refill   Blood Glucose Calibration (CONTOUR NEXT CONTROL) Normal SOLN USE TO CHECK CONTROLS ON  GLUCOSE METER 1 each 3   Blood Glucose Monitoring Suppl (CONTOUR NEXT MONITOR)  w/Device KIT Use to check blood sugar up to four times daily as directed. (FOR ICD-10 E11.9). Non insulin dependent. 1 kit 0   citalopram (CELEXA) 10 MG tablet Take 1 tablet (10 mg total) by mouth daily. 90 tablet 3   cyclobenzaprine (FLEXERIL) 10 MG tablet Take 1 tablet (10 mg total) by mouth 3 (three) times daily as needed for muscle spasms (sedation caution). 30 tablet 1   furosemide (LASIX) 20 MG tablet Take 2-3 tablets (40-60 mg total) by mouth daily.     glimepiride (AMARYL) 2 MG tablet Take 1 tablet (2 mg total) by mouth daily before breakfast. 90 tablet 3   glucose blood (CONTOUR NEXT TEST) test strip USE TO CHECK BLOOD SUGAR UP TO 4 TIMES DAILY AS  DIRECTED 400 strip 3   Lancet Devices (MICROLET NEXT LANCING DEVICE) MISC USE TO CHECK BLOOD SUGAR UP TO 4 TIMES DAILY AS DIRECTED 1 each 3   metFORMIN (GLUCOPHAGE) 500 MG tablet Take 2 tablets (1,000 mg total) by mouth 2 (two) times daily with a meal.     Omega-3 Fatty Acids (FISH OIL) 1000 MG CAPS Take 1 capsule (1,000 mg total) by mouth in the morning and at bedtime.     OZEMPIC, 0.25 OR 0.5 MG/DOSE, 2 MG/1.5ML SOPN INJECT 0.5 MG SUBCUTANEOUSLY AS  DIRECTED WEEKLY 4.5 mL 3   pantoprazole (PROTONIX) 20 MG tablet Take 1 tablet (20 mg total) by mouth daily. 90 tablet 3   scopolamine (TRANSDERM-SCOP) 1 MG/3DAYS Place 1 patch (1.5 mg total) onto the skin every 3 (three) days. 4 patch 1   No facility-administered medications prior to visit.   Review of Systems  Review of Systems  Constitutional: Negative.   HENT: Negative.    Respiratory: Negative.    Cardiovascular:  Positive for leg swelling.    Physical Exam  BP 140/76 (BP Location: Left Arm, Cuff Size: Large)   Pulse 85   Temp 97.8 F (36.6 C) (Temporal)   Ht 5' 10"  (1.778 m)   Wt (!) 326 lb 3.2 oz (148 kg)   SpO2 96%   BMI 46.80 kg/m  Physical Exam Constitutional:      General: He is not in acute distress.    Appearance: Normal appearance. He is obese. He is not  ill-appearing.  HENT:     Head: Normocephalic and atraumatic.  Cardiovascular:     Rate and Rhythm: Normal rate.  Pulmonary:     Effort: Pulmonary effort is normal.  Musculoskeletal:     Right lower leg: Edema present.     Left lower leg: Edema present.  Neurological:     General: No focal deficit present.     Mental Status: He is alert and oriented to person, place, and time. Mental status is at baseline.  Psychiatric:        Mood and Affect: Mood normal.        Behavior: Behavior normal.        Thought Content: Thought content normal.        Judgment: Judgment normal.  Lab Results:  CBC    Component Value Date/Time   WBC 8.3 11/07/2021 1153   RBC 4.91 11/07/2021 1153   HGB 14.9 11/07/2021 1153   HCT 44.6 11/07/2021 1153   PLT 237.0 11/07/2021 1153   MCV 90.8 11/07/2021 1153   MCH 29.9 07/14/2017 1339   MCHC 33.4 11/07/2021 1153   RDW 14.2 11/07/2021 1153   LYMPHSABS 1.8 11/07/2021 1153   MONOABS 0.6 11/07/2021 1153   EOSABS 0.2 11/07/2021 1153   BASOSABS 0.1 11/07/2021 1153    BMET    Component Value Date/Time   NA 141 12/12/2021 0950   K 4.0 12/12/2021 0950   CL 104 12/12/2021 0950   CO2 28 12/12/2021 0950   GLUCOSE 140 (H) 12/12/2021 0950   BUN 13 12/12/2021 0950   BUN 7 08/10/2017 1040   CREATININE 0.78 12/12/2021 0950   CREATININE 0.91 07/08/2012 0000   CALCIUM 8.9 12/12/2021 0950   GFRNONAA 114 08/10/2017 1040   GFRAA 132 08/10/2017 1040    BNP No results found for: "BNP"  ProBNP    Component Value Date/Time   PROBNP 14.0 11/07/2021 1153    Imaging: ECHOCARDIOGRAM COMPLETE  Result Date: 12/17/2021    ECHOCARDIOGRAM REPORT   Patient Name:   JAHLIL ZILLER [facility] Date of Exam: 12/17/2021 Medical Rec #:  [id number]             Height:       70.0 in Accession #:    [id number]            Weight:       322.0 lb Date of Birth:  01/09/1976             BSA:          2.556 m Patient Age:    81 years              BP:           140/80 mmHg Patient  Gender: M                     HR:           62 bpm. Exam Location:  [facility] Procedure: Cardiac Doppler and Color Doppler Indications:    R06.9 DOE  History:        Patient has prior history of Echocardiogram examinations, most                 recent 08/01/2015. Signs/Symptoms:Dyspnea and Edema; Risk                 Factors:Diabetes, Dyslipidemia and Non-Smoker. Patient complains                 of leg swelling.  Sonographer:    Wilkie Aye RVT Referring Phys: [id number]  Sonographer Comments: Suboptimal parasternal window and suboptimal apical window. Image acquisition challenging due to patient body habitus. Declined Definity IMPRESSIONS  1. Left ventricular ejection fraction, by estimation, is 50 to 55%. The left ventricle has low normal function. The left ventricle has no regional wall motion abnormalities. Left ventricular diastolic parameters are consistent with Grade II diastolic dysfunction (pseudonormalization).  2. Right ventricular systolic function is normal. The right ventricular size is normal. Tricuspid regurgitation signal is inadequate for assessing PA pressure.  3. The mitral valve is normal in structure. No evidence of mitral valve regurgitation. No evidence of mitral stenosis.  4. The aortic valve was not well visualized. Aortic valve regurgitation is  not visualized. No aortic stenosis is present.  5. The inferior vena cava is normal in size with greater than 50% respiratory variability, suggesting right atrial pressure of 3 mmHg. Comparison(s): 2017-EF55-60%. FINDINGS  Left Ventricle: Left ventricular ejection fraction, by estimation, is 50 to 55%. The left ventricle has low normal function. The left ventricle has no regional wall motion abnormalities. The left ventricular internal cavity size was normal in size. There is no left ventricular hypertrophy. Left ventricular diastolic parameters are consistent with Grade II diastolic dysfunction (pseudonormalization). Right Ventricle: The  right ventricular size is normal. No increase in right ventricular wall thickness. Right ventricular systolic function is normal. Tricuspid regurgitation signal is inadequate for assessing PA pressure. Left Atrium: Left atrial size was normal in size. Right Atrium: Right atrial size was normal in size. Pericardium: There is no evidence of pericardial effusion. Mitral Valve: The mitral valve is normal in structure. No evidence of mitral valve regurgitation. No evidence of mitral valve stenosis. Tricuspid Valve: The tricuspid valve is normal in structure. Tricuspid valve regurgitation is trivial. No evidence of tricuspid stenosis. Aortic Valve: The aortic valve was not well visualized. Aortic valve regurgitation is not visualized. No aortic stenosis is present. Aortic valve mean gradient measures 4.0 mmHg. Aortic valve peak gradient measures 6.6 mmHg. Aortic valve area, by VTI measures 1.97 cm. Pulmonic Valve: The pulmonic valve was normal in structure. Pulmonic valve regurgitation is not visualized. No evidence of pulmonic stenosis. Aorta: The aortic root is normal in size and structure. Venous: The inferior vena cava is normal in size with greater than 50% respiratory variability, suggesting right atrial pressure of 3 mmHg. IAS/Shunts: No atrial level shunt detected by color flow Doppler.  LEFT VENTRICLE PLAX 2D LVIDd:         6.40 cm   Diastology LVIDs:         4.00 cm   LV e' medial:    6.31 cm/s LV PW:         1.00 cm   LV E/e' medial:  11.0 LV IVS:        0.80 cm   LV e' lateral:   8.16 cm/s LVOT diam:     2.10 cm   LV E/e' lateral: 8.5 LV SV:         51 LV SV Index:   20 LVOT Area:     3.46 cm  RIGHT VENTRICLE RV Basal diam:  4.60 cm RV S prime:     13.70 cm/s TAPSE (M-mode): 2.0 cm LEFT ATRIUM           Index        RIGHT ATRIUM           Index LA diam:      3.80 cm 1.49 cm/m   RA Area:     15.40 cm LA Vol (A4C): 51.3 ml 20.05 ml/m  RA Volume:   41.50 ml  16.24 ml/m  AORTIC VALVE                     PULMONIC VALVE AV Area (Vmax):    1.80 cm     PV Vmax:       1.01 m/s AV Area (Vmean):   1.71 cm     PV Peak grad:  4.1 mmHg AV Area (VTI):     1.97 cm AV Vmax:           128.00 cm/s AV Vmean:  94.900 cm/s AV VTI:            0.257 m AV Peak Grad:      6.6 mmHg AV Mean Grad:      4.0 mmHg LVOT Vmax:         66.60 cm/s LVOT Vmean:        46.900 cm/s LVOT VTI:          0.146 m LVOT/AV VTI ratio: 0.57  AORTA Ao Root diam: 3.95 cm Ao Asc diam:  3.50 cm Ao Arch diam: 3.1 cm MITRAL VALVE MV Area (PHT): 3.10 cm    SHUNTS MV Decel Time: 245 msec    Systemic VTI:  0.15 m MV E velocity: 69.40 cm/s  Systemic Diam: 2.10 cm MV A velocity: 56.10 cm/s MV E/A ratio:  1.24 Ida Rogue MD Electronically signed by Ida Rogue MD Signature Date/Time: 12/17/2021/1:54:10 PM    Final      Assessment & Plan:   Severe sleep apnea - Patient has symptoms of loud snoring, restless sleep and daytime sleepiness.  Home sleep study on 12/04/2021 showed evidence of severe obstructive sleep apnea, AHI 32/hour SPO2 low 77% (average 91%).  Discussed sleep study results and treatment options including weight loss, oral appliance, CPAP therapy or referral to ENT for possible surgical options.  Recommending patient be started on CPAP due to severity of OSA and daytime symptoms.  Order has been placed for patient to be started on auto CPAP 5 to 20 cm H2O with mask of choice.  Advised patient aim to wear CPAP every night for minimum 4 to 6 hours or longer. Encouraged weight loss efforts.  Follow up in 2 to 3 months for compliance check.  Diabetes mellitus without complication ([place]) - Continue metformin, glimepiride and Ozempic   Proteinuria - Following with [facility] for proteinuria felt to be d/t poorly controlled diabetes   Lower extremity edema - Continue lasix 40-32m daily   EMartyn Ehrich NP 12/25/2021

## 2021-12-25 NOTE — Assessment & Plan Note (Addendum)
-   Continue metformin, glimepiride and Ozempic

## 2021-12-25 NOTE — Patient Instructions (Signed)
Home sleep study showed severe obstructive sleep apnea  Recommend treatment with CPAP  Recommendations - Once you receive CPAP please aim to wear every single night for minimum 4 to 6 hours or longer - Work on weight loss efforts - If experiencing excessive daytime sleepiness or fatigue  Orders - Auto CPAP 5 to 20 cm H2O with mask of choice  Follow-up - 2 to 3 months with Beth NP for CPAP compliance check   CPAP and BIPAP Information CPAP and BIPAP are methods that use air pressure to keep your airways open and to help you breathe well. CPAP and BIPAP use different amounts of pressure. Your health care provider will tell you whether CPAP or BIPAP would be more helpful for you. CPAP stands for "continuous positive airway pressure." With CPAP, the amount of pressure stays the same while you breathe in (inhale) and out (exhale). BIPAP stands for "bi-level positive airway pressure." With BIPAP, the amount of pressure will be higher when you inhale and lower when you exhale. This allows you to take larger breaths. CPAP or BIPAP may be used in the hospital, or your health care provider may want you to use it at home. You may need to have a sleep study before your health care provider can order a machine for you to use at home. What are the advantages? CPAP or BIPAP can be helpful if you have: Sleep apnea. Chronic obstructive pulmonary disease (COPD). Heart failure. Medical conditions that cause muscle weakness, including muscular dystrophy or amyotrophic lateral sclerosis (ALS). Other problems that cause breathing to be shallow, weak, abnormal, or difficult. CPAP and BIPAP are most commonly used for obstructive sleep apnea (OSA) to keep the airways from collapsing when the muscles relax during sleep. What are the risks? Generally, this is a safe treatment. However, problems may occur, including: Irritated skin or skin sores if the mask does not fit properly. Dry or stuffy nose or  nosebleeds. Dry mouth. Feeling gassy or bloated. Sinus or lung infection if the equipment is not cleaned properly. When should CPAP or BIPAP be used? In most cases, the mask only needs to be worn during sleep. Generally, the mask needs to be worn throughout the night and during any daytime naps. People with certain medical conditions may also need to wear the mask at other times, such as when they are awake. Follow instructions from your health care provider about when to use the machine. What happens during CPAP or BIPAP?  Both CPAP and BIPAP are provided by a small machine with a flexible plastic tube that attaches to a plastic mask that you wear. Air is blown through the mask into your nose or mouth. The amount of pressure that is used to blow the air can be adjusted on the machine. Your health care provider will set the pressure setting and help you find the best mask for you. Tips for using the mask Because the mask needs to be snug, some people feel trapped or closed-in (claustrophobic) when first using the mask. If you feel this way, you may need to get used to the mask. One way to do this is to hold the mask loosely over your nose or mouth and then gradually apply the mask more snugly. You can also gradually increase the amount of time that you use the mask. Masks are available in various types and sizes. If your mask does not fit well, talk with your health care provider about getting a different one. Some common  types of masks include: Full face masks, which fit over the mouth and nose. Nasal masks, which fit over the nose. Nasal pillow or prong masks, which fit into the nostrils. If you are using a mask that fits over your nose and you tend to breathe through your mouth, a chin strap may be applied to help keep your mouth closed. Use a skin barrier to protect your skin as told by your health care provider. Some CPAP and BIPAP machines have alarms that may sound if the mask comes off or  develops a leak. If you have trouble with the mask, it is very important that you talk with your health care provider about finding a way to make the mask easier to tolerate. Do not stop using the mask. There could be a negative impact on your health if you stop using the mask. Tips for using the machine Place your CPAP or BIPAP machine on a secure table or stand near an electrical outlet. Know where the on/off switch is on the machine. Follow instructions from your health care provider about how to set the pressure on your machine and when you should use it. Do not eat or drink while the CPAP or BIPAP machine is on. Food or fluids could get pushed into your lungs by the pressure of the CPAP or BIPAP. For home use, CPAP and BIPAP machines can be rented or purchased through home health care companies. Many different brands of machines are available. Renting a machine before purchasing may help you find out which particular machine works well for you. Your health insurance company may also decide which machine you may get. Keep the CPAP or BIPAP machine and attachments clean. Ask your health care provider for specific instructions. Check the humidifier if you have a dry stuffy nose or nosebleeds. Make sure it is working correctly. Follow these instructions at home: Take over-the-counter and prescription medicines only as told by your health care provider. Ask if you can take sinus medicine if your sinuses are blocked. Do not use any products that contain nicotine or tobacco. These products include cigarettes, chewing tobacco, and vaping devices, such as e-cigarettes. If you need help quitting, ask your health care provider. Keep all follow-up visits. This is important. Contact a health care provider if: You have redness or pressure sores on your head, face, mouth, or nose from the mask or head gear. You have trouble using the CPAP or BIPAP machine. You cannot tolerate wearing the CPAP or BIPAP  mask. Someone tells you that you snore even when wearing your CPAP or BIPAP. Get help right away if: You have trouble breathing. You feel confused. Summary CPAP and BIPAP are methods that use air pressure to keep your airways open and to help you breathe well. If you have trouble with the mask, it is very important that you talk with your health care provider about finding a way to make the mask easier to tolerate. Do not stop using the mask. There could be a negative impact to your health if you stop using the mask. Follow instructions from your health care provider about when to use the machine. This information is not intended to replace advice given to you by your health care provider. Make sure you discuss any questions you have with your health care provider. Document Revised: 01/02/2021 Document Reviewed: 05/04/2020 Elsevier Patient Education  [place].

## 2021-12-25 NOTE — Progress Notes (Signed)
Reviewed and agree with assessment/plan.   Chesley Mires, MD [facility] Pulmonary/Critical Care 12/25/2021, 4:35 PM Pager:  [phone]

## 2021-12-25 NOTE — Assessment & Plan Note (Signed)
-   Patient has symptoms of loud snoring, restless sleep and daytime sleepiness.  Home sleep study on 12/04/2021 showed evidence of severe obstructive sleep apnea, AHI 32/hour SPO2 low 77% (average 91%).  Discussed sleep study results and treatment options including weight loss, oral appliance, CPAP therapy or referral to ENT for possible surgical options.  Recommending patient be started on CPAP due to severity of OSA and daytime symptoms.  Order has been placed for patient to be started on auto CPAP 5 to 20 cm H2O with mask of choice.  Advised patient aim to wear CPAP every night for minimum 4 to 6 hours or longer. Encouraged weight loss efforts.  Follow up in 2 to 3 months for compliance check.

## 2021-12-25 NOTE — Assessment & Plan Note (Signed)
-   Continue lasix 40-'60mg'$  daily

## 2021-12-25 NOTE — Assessment & Plan Note (Signed)
-   Following with [facility] for proteinuria felt to be d/t poorly controlled diabetes

## 2021-12-26 ENCOUNTER — Telehealth: Payer: Self-pay | Admitting: Family Medicine

## 2021-12-26 NOTE — Telephone Encounter (Signed)
Patient called in wanting to make you aware of how much he currently weigh. He currently weighs 326 as of 12/25/21. He stated swelling in feet haven't went down. Thank you!

## 2021-12-27 ENCOUNTER — Other Ambulatory Visit: Payer: Self-pay | Admitting: Nephrology

## 2021-12-27 ENCOUNTER — Other Ambulatory Visit: Payer: Self-pay | Admitting: Family Medicine

## 2021-12-27 DIAGNOSIS — E785 Hyperlipidemia, unspecified: Secondary | ICD-10-CM

## 2021-12-27 MED ORDER — FUROSEMIDE 20 MG PO TABS
40.0000 mg | ORAL_TABLET | Freq: Two times a day (BID) | ORAL | Status: DC
Start: 2021-12-27 — End: 2022-01-09

## 2021-12-27 NOTE — Telephone Encounter (Signed)
Called and spoke w/ pt informed to being to take '40mg'$  Bid and  limit his intake 1.5L and cut back on his salt intake. Pt is going call us back in 1 week to let us know [place] know what his weight is, pt said that he was already taking 20 mg 3 times a day. Pt said that he will try this and call us back.

## 2021-12-27 NOTE — Telephone Encounter (Signed)
I would try taking 40 mg Lasix twice a day for 1 week and then have him update me about his weight.  Let me know if he needs a new prescription in the meantime. Limit his liquid intake to 1.5 L a day and cut back on salt in the meantime. Thanks.

## 2021-12-27 NOTE — Telephone Encounter (Signed)
Noted. Thanks.

## 2022-01-06 ENCOUNTER — Ambulatory Visit
Admission: RE | Admit: 2022-01-06 | Discharge: 2022-01-06 | Disposition: A | Discharge status: Home / Self Care | Payer: BC Managed Care – PPO | Source: Ambulatory Visit | Attending: Nephrology | Admitting: Nephrology

## 2022-01-06 DIAGNOSIS — E785 Hyperlipidemia, unspecified: Secondary | ICD-10-CM | POA: Diagnosis not present

## 2022-01-09 ENCOUNTER — Ambulatory Visit: Payer: BC Managed Care – PPO | Admitting: Family Medicine

## 2022-01-09 ENCOUNTER — Encounter: Payer: Self-pay | Admitting: Family Medicine

## 2022-01-09 VITALS — BP 128/84 | HR 81 | Temp 97.8°F | Ht 70.0 in | Wt 320.0 lb

## 2022-01-09 DIAGNOSIS — E119 Type 2 diabetes mellitus without complications: Secondary | ICD-10-CM | POA: Diagnosis not present

## 2022-01-09 DIAGNOSIS — G4730 Sleep apnea, unspecified: Secondary | ICD-10-CM

## 2022-01-09 DIAGNOSIS — R809 Proteinuria, unspecified: Secondary | ICD-10-CM | POA: Diagnosis not present

## 2022-01-09 LAB — POCT GLYCOSYLATED HEMOGLOBIN (HGB A1C): Hemoglobin A1C: 7 % — AB (ref 4.0–5.6)

## 2022-01-09 MED ORDER — FUROSEMIDE 40 MG PO TABS: 40.0000 mg | ORAL_TABLET | Freq: Two times a day (BID) | ORAL | 3 refills | Status: DC

## 2022-01-09 NOTE — Patient Instructions (Signed)
Plan on recheck in about 3 months.  '40mg'$  lasix twice a day, cut back if needed or lightheaded.  Take care.  Glad to see you.

## 2022-01-09 NOTE — Progress Notes (Signed)
Diabetes:  Using medications without difficulties: yes Hypoglycemic episodes: lowest was 86, corrected with a snack.  He had antecedent prolonged fasting, d/w pt.   Hyperglycemic episodes: no Feet problems:no tingling.   Blood Sugars averaging: usually in the 100s.   A1c 7.  D/w pt at OV.  No abd sx from ozempic other than discomfort occ from needle stick.    Proteinuria and renal eval d/w pt.  Per renal: Proteinuria: Patient has proteinuria most likely secondary to poorly diabetes complicated by obesity leading to FSGS.    D/w pt about lasix use.  '40mg'$  BID helped with swelling.    He is working on getting his CPAP set up.  D/w pt about echo with diastolic dysfunction.  D/w pt about managing BP, weight, sugar and getting CPAP set up.   Meds, vitals, and allergies reviewed.   ROS: Per HPI unless specifically indicated in ROS section   GEN: nad, alert and oriented HEENT: ncat NECK: supple w/o LA CV: rrr. PULM: ctab, no inc wob ABD: soft, +bs EXT: 1-2+ edema SKIN: no acute rash  Diabetic foot exam: Normal inspection No skin breakdown No calluses  Normal DP pulses Normal sensation to light touch and monofilament Nails normal

## 2022-01-10 NOTE — Assessment & Plan Note (Signed)
Recheck in 3 months.  Continue Ozempic metformin and glimepiride for now.  Continue work on diet.

## 2022-01-10 NOTE — Assessment & Plan Note (Signed)
He is working on getting his CPAP set up.  D/w pt about echo with diastolic dysfunction.  D/w pt about managing BP, weight, sugar and getting CPAP set up.

## 2022-01-10 NOTE — Assessment & Plan Note (Signed)
Proteinuria and renal eval d/w pt.  Per renal: Proteinuria: Patient has proteinuria most likely secondary to poorly diabetes complicated by obesity leading to FSGS.    D/w pt about lasix use.  '40mg'$  BID helped with swelling.  Would continue 40 mg twice a day for now.  See after visit summary.  He can cut back if the swelling is significantly better or if he is lightheaded.

## 2022-01-21 DIAGNOSIS — G4733 Obstructive sleep apnea (adult) (pediatric): Secondary | ICD-10-CM | POA: Diagnosis not present

## 2022-02-09 ENCOUNTER — Other Ambulatory Visit: Payer: Self-pay | Admitting: Family Medicine

## 2022-02-21 DIAGNOSIS — G4733 Obstructive sleep apnea (adult) (pediatric): Secondary | ICD-10-CM | POA: Diagnosis not present

## 2022-03-04 DIAGNOSIS — K760 Fatty (change of) liver, not elsewhere classified: Secondary | ICD-10-CM | POA: Diagnosis not present

## 2022-03-04 DIAGNOSIS — E785 Hyperlipidemia, unspecified: Secondary | ICD-10-CM | POA: Diagnosis not present

## 2022-03-04 DIAGNOSIS — G4733 Obstructive sleep apnea (adult) (pediatric): Secondary | ICD-10-CM | POA: Diagnosis not present

## 2022-03-04 DIAGNOSIS — E663 Overweight: Secondary | ICD-10-CM | POA: Diagnosis not present

## 2022-03-13 ENCOUNTER — Ambulatory Visit (INDEPENDENT_AMBULATORY_CARE_PROVIDER_SITE_OTHER): Payer: BC Managed Care – PPO

## 2022-03-13 DIAGNOSIS — Z23 Encounter for immunization: Secondary | ICD-10-CM | POA: Diagnosis not present

## 2022-03-23 DIAGNOSIS — G4733 Obstructive sleep apnea (adult) (pediatric): Secondary | ICD-10-CM | POA: Diagnosis not present

## 2022-04-02 ENCOUNTER — Encounter: Payer: Self-pay | Admitting: Primary Care

## 2022-04-02 ENCOUNTER — Ambulatory Visit: Payer: BC Managed Care – PPO | Admitting: Primary Care

## 2022-04-02 VITALS — BP 138/78 | HR 93 | Temp 97.9°F | Ht 70.0 in | Wt 321.0 lb

## 2022-04-02 DIAGNOSIS — G4733 Obstructive sleep apnea (adult) (pediatric): Secondary | ICD-10-CM | POA: Diagnosis not present

## 2022-04-02 DIAGNOSIS — G4730 Sleep apnea, unspecified: Secondary | ICD-10-CM | POA: Diagnosis not present

## 2022-04-02 NOTE — Progress Notes (Signed)
_0  ID: Kyle Murphy, male    DOB: [birth year], 46 y.o.   MRN: [id number]  Chief Complaint  Patient presents with   Follow-up    Wearing cpap avg 6hr nightly- pressure and mask is okay.     Referring provider: Tonia Ghent, MD  HPI: 46 year old male, never smoked.  Past medical history significant for diabetes, GERD, fatty liver, rectal polyp, hyperlipidemia.  Previous [facility] pulmonary encounter: 10/25/2021 Patient presents today for sleep consult. He has symptoms of snoring, restless sleep and daytime sleepiness. Frequently wakes up at night. Daytime sleepiness lasts all day. He drives fork lifts. He has never fallen asleep while driving. Typical bedtime is between 12:30am-1am. He is unsure how long it takes him to fall asleep. He wakes up 2-3 times at night. He starts his day at 9am. He has never had a sleep study before. Denies symptoms of narcolepsy, cataplexy or sleep walking. He is working on weight loss efforts. Epworth is 4/24.   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  Patient has symptoms of loud snoring, restless sleep and daytime sleepiness.  He had home sleep study on 12/04/2021 that showed evidence of severe obstructive sleep apnea, AHI 32/hr with SPO2 low 77% (average 91%).  Discussed sleep study results and treatment options.  Recommending patient be started on CPAP therapy due to severity of OSA and daytime symptoms.  He is open to starting CPAP.  He is aware that he needs to lose weight.  He was seen by [facility] yesterday for proteinuria.  Diabetes  felt to be poorly controlled.  He has new leg swelling.  He is on Lasix with 1.5 fluid liter restriction and 2 g salt diet.   04/02/2022  Interim hx  Patient presents today for 3 month follow-up for OSA. He had a HST on 12/04/21 that showed severe OSA, AHI 32/hour. He was ordered for auto CPAP 5-20cm h20 during his last OV in July.   He is doing well. He is 100% compliant with CPAP and reports benefit from use. No issues with mask fit or pressure settings. There are some days he is not tired at all. Most night he sleeps through the night, he may wake up around 5am to use the restroom d/t diuretics. He is working on weight loss, on Ozempic.    Airview download 03/02/22-03/31/22 30/30 days used; 97% >4 hours Average usage 6 hours 14 mins Pressure 5-20cm h20 (11.2cm h20-95%) Airleaks 8.2L/min (95%) AHI 1.4    Allergies  Allergen Reactions   Ace Inhibitors     H/o lip swelling.    Angiotensin Receptor Blockers     H/o lip swelling.    Penicillins     Has patient had a PCN reaction causing immediate rash, facial/tongue/throat swelling, SOB or lightheadedness with hypotension: No Has patient had a PCN reaction causing severe rash involving mucus membranes or skin necrosis: No Has patient had a PCN reaction that required hospitalization: No Has patient had a PCN reaction occurring within the last 10 years: No If all of the above answers are "NO", then may proceed with Cephalosporin use.    Pravastatin  GI upset.     Immunization History  Administered Date(s) Administered   Influenza Split 04/01/2011, 04/01/2012, 01/26/2018   Influenza Whole 03/28/2010   Influenza,inj,Quad PF,6+ Mos 03/24/2014, 03/23/2015, 03/28/2016, 02/27/2017, 01/26/2018, 01/28/2019, 06/20/2020, 03/04/2021, 03/13/2022   PFIZER(Purple Top)SARS-COV-2 Vaccination 09/19/2019, 10/10/2019, 05/04/2020   Pfizer Covid-19 Vaccine Bivalent Booster 5y-11y 03/20/2021   Pneumococcal Polysaccharide-23 10/22/2017   Td  03/28/2010   Tdap 08/15/2016    Past Medical History:  Diagnosis Date   Anxiety    Diabetes ([place])    History of heartburn    HLD (hyperlipidemia)    Leg cramps    history of   Migraines    with photo and phonophobia   Multiple thyroid nodules    Plantar fasciitis    L foot, injected [facility] 2014   Salmonella enteritis 2011   stool culture positive    Tobacco History: Social History   Tobacco Use  Smoking Status Never  Smokeless Tobacco Never   Counseling given: Not Answered   Outpatient Medications Prior to Visit  Medication Sig Dispense Refill   Blood Glucose Calibration (CONTOUR NEXT CONTROL) Normal SOLN USE TO CHECK CONTROLS ON  GLUCOSE METER 1 each 3   Blood Glucose Monitoring Suppl (CONTOUR NEXT MONITOR) w/Device KIT Use to check blood sugar up to four times daily as directed. (FOR ICD-10 E11.9). Non insulin dependent. 1 kit 0   citalopram (CELEXA) 10 MG tablet Take 1 tablet (10 mg total) by mouth daily. 90 tablet 3   cyclobenzaprine (FLEXERIL) 10 MG tablet Take 1 tablet (10 mg total) by mouth 3 (three) times daily as needed for muscle spasms (sedation caution). 30 tablet 1   furosemide (LASIX) 40 MG tablet Take 1 tablet (40 mg total) by mouth 2 (two) times daily. 180 tablet 3   glimepiride (AMARYL) 2 MG tablet Take 1 tablet (2 mg total) by mouth daily before breakfast. 90 tablet 3   glucose blood (CONTOUR NEXT TEST) test strip USE TO CHECK BLOOD SUGAR UP TO 4 TIMES DAILY AS  DIRECTED 400 strip 3   Lancet Devices (MICROLET NEXT LANCING DEVICE) MISC USE TO CHECK BLOOD SUGAR UP TO 4 TIMES DAILY AS DIRECTED 1 each 3   metFORMIN (GLUCOPHAGE) 500 MG tablet Take 2 tablets (1,000 mg total) by mouth 2 (two) times daily with a meal.     Omega-3 Fatty Acids (FISH OIL) 1000 MG CAPS Take 1 capsule (1,000 mg total) by mouth in the morning and at bedtime.     OZEMPIC, 0.25 OR 0.5 MG/DOSE, 2 MG/1.5ML SOPN INJECT 0.5 MG SUBCUTANEOUSLY AS  DIRECTED WEEKLY 4.5 mL 3    pantoprazole (PROTONIX) 20 MG tablet Take 1 tablet (20 mg total) by mouth daily. 90 tablet 3   scopolamine (TRANSDERM-SCOP) 1 MG/3DAYS Place 1 patch (1.5 mg total) onto the skin every 3 (three) days. 4 patch 1   No facility-administered medications prior to visit.   Review of Systems  Review of Systems  Constitutional: Negative.   HENT: Negative.    Respiratory: Negative.     Physical Exam  BP 138/78 (BP Location: Left Arm, Cuff Size: Large)   Pulse 93   Temp 97.9 F (36.6 C) (Temporal)   Ht _0  (1.778 m)   Wt (!) 321 lb (145.6 kg)   SpO2 97%   BMI 46.06 kg/m  Physical Exam Constitutional:      Appearance: Normal appearance.  HENT:     Head: Normocephalic and atraumatic.  Cardiovascular:     Rate  and Rhythm: Normal rate and regular rhythm.  Pulmonary:     Effort: Pulmonary effort is normal.     Breath sounds: Normal breath sounds.  Neurological:     General: No focal deficit present.     Mental Status: He is alert and oriented to person, place, and time. Mental status is at baseline.  Psychiatric:        Mood and Affect: Mood normal.        Behavior: Behavior normal.        Thought Content: Thought content normal.        Judgment: Judgment normal.      Lab Results:  CBC    Component Value Date/Time   WBC 8.3 11/07/2021 1153   RBC 4.91 11/07/2021 1153   HGB 14.9 11/07/2021 1153   HCT 44.6 11/07/2021 1153   PLT 237.0 11/07/2021 1153   MCV 90.8 11/07/2021 1153   MCH 29.9 07/14/2017 1339   MCHC 33.4 11/07/2021 1153   RDW 14.2 11/07/2021 1153   LYMPHSABS 1.8 11/07/2021 1153   MONOABS 0.6 11/07/2021 1153   EOSABS 0.2 11/07/2021 1153   BASOSABS 0.1 11/07/2021 1153    BMET    Component Value Date/Time   NA 141 12/12/2021 0950   K 4.0 12/12/2021 0950   CL 104 12/12/2021 0950   CO2 28 12/12/2021 0950   GLUCOSE 140 (H) 12/12/2021 0950   BUN 13 12/12/2021 0950   BUN 7 08/10/2017 1040   CREATININE 0.78 12/12/2021 0950   CREATININE 0.91 07/08/2012 0000    CALCIUM 8.9 12/12/2021 0950   GFRNONAA 114 08/10/2017 1040   GFRAA 132 08/10/2017 1040    BNP No results found for: "BNP"  ProBNP    Component Value Date/Time   PROBNP 14.0 11/07/2021 1153    Imaging: No results found.   Assessment & Plan:   Severe sleep apnea - HST on 12/04/21 that showed severe OSA, AHI 32/hour. Patient is 100% compliant with CPAP over that last 30 days and reports benefit from use. Current pressure settings auto titrate 5-20cm h20. Residual AHI 1.4/hour. No changes needed today. Continue to encourage patient wear CPAP every night for 4-6 hours or longer. Continue to work on weight loss efforts. Renew CPAP supplies with DME. Follow-up in 6 months with [facility] NP.      Martyn Ehrich, NP 04/02/2022

## 2022-04-02 NOTE — Assessment & Plan Note (Signed)
-   HST on 12/04/21 that showed severe OSA, AHI 32/hour. Patient is 100% compliant with CPAP over that last 30 days and reports benefit from use. Current pressure settings auto titrate 5-20cm h20. Residual AHI 1.4/hour. No changes needed today. Continue to encourage patient wear CPAP every night for 4-6 hours or longer. Continue to work on weight loss efforts. Renew CPAP supplies with DME. Follow-up in 6 months with [facility] NP.

## 2022-04-02 NOTE — Patient Instructions (Signed)
Excellent compliance with CPAP Apneas are well controlled on current pressure settings No changes today Continue to wear CPAP every night 4-6 hours or longer Continue to work on weight loss efforts as you have been Change CPAP supplies every 3 months   Orders: Renew CPAP supplies with DME   Follow-up 6 months with Eustaquio Maize NP

## 2022-04-14 ENCOUNTER — Encounter: Payer: Self-pay | Admitting: Family Medicine

## 2022-04-14 ENCOUNTER — Ambulatory Visit: Payer: BC Managed Care – PPO | Admitting: Family Medicine

## 2022-04-14 VITALS — BP 128/64 | HR 74 | Temp 98.0°F | Ht 70.0 in | Wt 313.0 lb

## 2022-04-14 DIAGNOSIS — E119 Type 2 diabetes mellitus without complications: Secondary | ICD-10-CM | POA: Diagnosis not present

## 2022-04-14 DIAGNOSIS — R202 Paresthesia of skin: Secondary | ICD-10-CM | POA: Diagnosis not present

## 2022-04-14 DIAGNOSIS — R809 Proteinuria, unspecified: Secondary | ICD-10-CM

## 2022-04-14 LAB — BASIC METABOLIC PANEL
BUN: 8 mg/dL (ref 6–23)
CO2: 30 mEq/L (ref 19–32)
Calcium: 8.6 mg/dL (ref 8.4–10.5)
Chloride: 102 mEq/L (ref 96–112)
Creatinine, Ser: 0.76 mg/dL (ref 0.40–1.50)
GFR: 107.49 mL/min (ref >60.00)
Glucose, Bld: 116 mg/dL — ABNORMAL HIGH (ref 70–99)
Potassium: 4.2 mEq/L (ref 3.5–5.1)
Sodium: 139 mEq/L (ref 135–145)

## 2022-04-14 LAB — HEMOGLOBIN A1C: Hgb A1c MFr Bld: 7.5 % — ABNORMAL HIGH (ref 4.6–6.5)

## 2022-04-14 MED ORDER — FUROSEMIDE 40 MG PO TABS: 40.0000 mg | ORAL_TABLET | Freq: Every day | ORAL | Status: DC

## 2022-04-14 NOTE — Patient Instructions (Addendum)
Go to the lab on the way out.   If you have mychart we'll likely use that to update you.    Take care.  Glad to see you. Plan on recheck in about 3 months.  Labs at the visit.   If you have hand numbness, then keep using the splint (especially at night).  If that isn't helping then let me know.

## 2022-04-14 NOTE — Progress Notes (Unsigned)
Diabetes:  Using medications without difficulties: see below  Hypoglycemic episodes: down to 79, cautions d/w pt.   Hyperglycemic episodes: no Feet problems: no Blood Sugars averaging: usually ~130-160.   eye exam within last year: yes Swelling is better in the meantime.  He cut back on lasix, down to '40mg'$  a day.  He is working on weight loss, d/w pt.   He noted diarrhea until this past week.  Better now.    Weight and FSGS d/w pt.  He had pulmonary f/u re: OSA.  He feels better with using CPAP.    Some R hand numbness, "like it goes to sleep."  Comes and goes.  Grip wnl.    Meds, vitals, and allergies reviewed.   ROS: Per HPI unless specifically indicated in ROS section   GEN: nad, alert and oriented HEENT: ncat NECK: supple w/o LA CV: rrr. PULM: ctab, no inc wob ABD: soft, +bs EXT: 1+ BLE edema SKIN: no acute rash  Normal R hand strength and sensation today.  Normal BUE strength and sensation.  Tinel neg today B wrists.    Diabetic foot exam: Normal inspection No skin breakdown No calluses  Normal DP pulses Normal sensation to light touch and monofilament Nails normal

## 2022-04-15 LAB — PROTEIN, URINE, RANDOM: Total Protein, Urine: 196 mg/dL — ABNORMAL HIGH (ref 5–25)

## 2022-04-16 NOTE — Assessment & Plan Note (Signed)
With likely obesity related FSGS followed by renal.  Discussed diet and exercise than goal for weight loss.  See notes on follow-up labs.  He was able to decrease his Lasix down to 40 mg a day.  Diarrhea resolved in the meantime.  Continue Ozempic and metformin and glimepiride as is for now.  Recheck periodically.

## 2022-04-16 NOTE — Assessment & Plan Note (Addendum)
Likely due to carpal tunnel but with normal exam today.  If having recurrent hand numbness, then keep using a carpal tunnel splint (especially at night).  If that isn't helping then let me know.

## 2022-04-21 ENCOUNTER — Encounter: Payer: Self-pay | Admitting: Family Medicine

## 2022-04-21 DIAGNOSIS — G4733 Obstructive sleep apnea (adult) (pediatric): Secondary | ICD-10-CM | POA: Diagnosis not present

## 2022-04-23 DIAGNOSIS — G4733 Obstructive sleep apnea (adult) (pediatric): Secondary | ICD-10-CM | POA: Diagnosis not present

## 2022-05-15 ENCOUNTER — Other Ambulatory Visit: Payer: Self-pay

## 2022-05-15 DIAGNOSIS — E119 Type 2 diabetes mellitus without complications: Secondary | ICD-10-CM

## 2022-05-15 MED ORDER — CONTOUR NEXT TEST VI STRP: ORAL_STRIP | 3 refills | Status: DC

## 2022-05-21 DIAGNOSIS — G4733 Obstructive sleep apnea (adult) (pediatric): Secondary | ICD-10-CM | POA: Diagnosis not present

## 2022-05-23 DIAGNOSIS — G4733 Obstructive sleep apnea (adult) (pediatric): Secondary | ICD-10-CM | POA: Diagnosis not present

## 2022-06-04 DIAGNOSIS — R059 Cough, unspecified: Secondary | ICD-10-CM | POA: Diagnosis not present

## 2022-06-04 DIAGNOSIS — J101 Influenza due to other identified influenza virus with other respiratory manifestations: Secondary | ICD-10-CM | POA: Diagnosis not present

## 2022-06-04 DIAGNOSIS — J018 Other acute sinusitis: Secondary | ICD-10-CM | POA: Diagnosis not present

## 2022-06-04 DIAGNOSIS — B9689 Other specified bacterial agents as the cause of diseases classified elsewhere: Secondary | ICD-10-CM | POA: Diagnosis not present

## 2022-06-04 DIAGNOSIS — R509 Fever, unspecified: Secondary | ICD-10-CM | POA: Diagnosis not present

## 2022-06-04 DIAGNOSIS — Z03818 Encounter for observation for suspected exposure to other biological agents ruled out: Secondary | ICD-10-CM | POA: Diagnosis not present

## 2022-06-04 DIAGNOSIS — R051 Acute cough: Secondary | ICD-10-CM | POA: Diagnosis not present

## 2022-06-21 DIAGNOSIS — G4733 Obstructive sleep apnea (adult) (pediatric): Secondary | ICD-10-CM | POA: Diagnosis not present

## 2022-06-23 DIAGNOSIS — G4733 Obstructive sleep apnea (adult) (pediatric): Secondary | ICD-10-CM | POA: Diagnosis not present

## 2022-07-03 DIAGNOSIS — E1122 Type 2 diabetes mellitus with diabetic chronic kidney disease: Secondary | ICD-10-CM | POA: Diagnosis not present

## 2022-07-03 DIAGNOSIS — N182 Chronic kidney disease, stage 2 (mild): Secondary | ICD-10-CM | POA: Diagnosis not present

## 2022-07-03 DIAGNOSIS — R801 Persistent proteinuria, unspecified: Secondary | ICD-10-CM | POA: Diagnosis not present

## 2022-07-03 DIAGNOSIS — R600 Localized edema: Secondary | ICD-10-CM | POA: Diagnosis not present

## 2022-07-03 DIAGNOSIS — N189 Chronic kidney disease, unspecified: Secondary | ICD-10-CM | POA: Diagnosis not present

## 2022-07-11 ENCOUNTER — Other Ambulatory Visit: Payer: Self-pay | Admitting: Family Medicine

## 2022-07-15 ENCOUNTER — Ambulatory Visit: Payer: BC Managed Care – PPO | Admitting: Family Medicine

## 2022-07-15 ENCOUNTER — Encounter: Payer: Self-pay | Admitting: Family Medicine

## 2022-07-15 VITALS — BP 110/78 | HR 95 | Temp 97.9°F | Ht 70.0 in | Wt 305.0 lb

## 2022-07-15 DIAGNOSIS — E119 Type 2 diabetes mellitus without complications: Secondary | ICD-10-CM | POA: Diagnosis not present

## 2022-07-15 LAB — POCT GLYCOSYLATED HEMOGLOBIN (HGB A1C): Hemoglobin A1C: 6.2 % — AB (ref 4.0–5.6)

## 2022-07-15 MED ORDER — CYCLOBENZAPRINE HCL 10 MG PO TABS: 10.0000 mg | ORAL_TABLET | Freq: Three times a day (TID) | ORAL | 1 refills | Status: DC | PRN

## 2022-07-15 NOTE — Patient Instructions (Addendum)
Check with your insurance to see if they will cover Farxiga.   Take care.  Glad to see you. Recheck at a yearly visit in about 4 months.  Labs ahead of time if possible.

## 2022-07-15 NOTE — Progress Notes (Unsigned)
Diabetes:  Using medications without difficulties: yes Hypoglycemic episodes:no Hyperglycemic episodes:no Feet problems: no Blood Sugars averaging: ~100s eye exam within last year: yes A1c d/w pt at OV.  6.2  Weight is down some in the interval, d/w pt.   Glimepiride, ozempic 0.'5mg'$ , metformin.   He changed his diet in the meantime.    He had used for flexeril prn for back pain.  No ADE on med.   Meds, vitals, and allergies reviewed.   ROS: Per HPI unless specifically indicated in ROS section   GEN: nad, alert and oriented HEENT: mucous membranes moist NECK: supple w/o LA CV: rrr. PULM: ctab, no inc wob ABD: soft, +bs EXT: trace almost zero BLE edema SKIN: no acute rash  Diabetic foot exam: Normal inspection No skin breakdown No calluses  Normal DP pulses Normal sensation to light touch and monofilament Nails normal

## 2022-07-16 NOTE — Assessment & Plan Note (Signed)
I asked her to check with his insurance to see if they will cover [place].   A1c clearly better.  Continue glimepiride Ozempic and metformin as is.  Continue work on diet. Recheck at a yearly visit in about 4 months.  Labs ahead of time if possible.  Swelling is significantly better.

## 2022-07-24 DIAGNOSIS — G4733 Obstructive sleep apnea (adult) (pediatric): Secondary | ICD-10-CM | POA: Diagnosis not present

## 2022-08-08 DIAGNOSIS — M25512 Pain in left shoulder: Secondary | ICD-10-CM | POA: Diagnosis not present

## 2022-08-14 ENCOUNTER — Other Ambulatory Visit: Payer: Self-pay | Admitting: Family Medicine

## 2022-08-19 ENCOUNTER — Other Ambulatory Visit: Payer: Self-pay | Admitting: Family Medicine

## 2022-08-21 ENCOUNTER — Other Ambulatory Visit: Payer: Self-pay

## 2022-08-21 ENCOUNTER — Emergency Department
Admission: EM | Admit: 2022-08-21 | Discharge: 2022-08-21 | Disposition: A | Discharge status: Home / Self Care | Payer: BC Managed Care – PPO | Attending: Emergency Medicine | Admitting: Emergency Medicine

## 2022-08-21 DIAGNOSIS — M5412 Radiculopathy, cervical region: Secondary | ICD-10-CM | POA: Diagnosis not present

## 2022-08-21 DIAGNOSIS — M25512 Pain in left shoulder: Secondary | ICD-10-CM | POA: Diagnosis not present

## 2022-08-21 MED ORDER — PREDNISONE 10 MG PO TABS: 10.0000 mg | ORAL_TABLET | Freq: Every day | ORAL | 0 refills | Status: DC

## 2022-08-21 MED ORDER — CYCLOBENZAPRINE HCL 5 MG PO TABS: 5.0000 mg | ORAL_TABLET | Freq: Three times a day (TID) | ORAL | 0 refills | Status: DC | PRN

## 2022-08-21 MED ORDER — HYDROCODONE-ACETAMINOPHEN 5-325 MG PO TABS: 1.0000 | ORAL_TABLET | Freq: Four times a day (QID) | ORAL | 0 refills | Status: DC | PRN

## 2022-08-21 NOTE — ED Provider Notes (Signed)
[facility] Provider Note   CSN: [id number] Arrival date & time: 08/21/22  1626     History  Chief Complaint  Patient presents with   Shoulder Pain    Left shoulder pain     Kyle Murphy is a 47 y.o. male.  Presents to the emergency department valuation of left shoulder pain.  He points to the left superior scapular border.  Describes sharp pain with no known trauma or injury.  Patient states symptoms began a couple weeks ago after lifting heavy case of water.  Was seen by orthopedic office had negative x-rays of the shoulder was placed on anti-inflammatory medication.  He is continuing to have some pain and discomfort.  Denies any weakness in the upper extremities.  Pain extends from the suprascapular border down into the left forearm.  No chest pain or shortness of breath.  HPI     Home Medications Prior to Admission medications   Medication Sig Start Date End Date Taking? Authorizing Provider  cyclobenzaprine (FLEXERIL) 5 MG tablet Take 1-2 tablets (5-10 mg total) by mouth 3 (three) times daily as needed for muscle spasms. 08/21/22  Yes Duanne Guess, PA-C  HYDROcodone-acetaminophen (NORCO) 5-325 MG tablet Take 1 tablet by mouth every 6 (six) hours as needed for moderate pain. 08/21/22  Yes Duanne Guess, PA-C  predniSONE (DELTASONE) 10 MG tablet Take 1 tablet (10 mg total) by mouth daily. 6,5,4,3,2,1 six day taper 08/21/22  Yes Duanne Guess, PA-C  Blood Glucose Calibration (CONTOUR NEXT CONTROL) Normal SOLN USE TO CHECK CONTROLS ON  GLUCOSE METER 11/05/21   Tonia Ghent, MD  Blood Glucose Monitoring Suppl (CONTOUR NEXT MONITOR) w/Device KIT Use to check blood sugar up to four times daily as directed. (FOR ICD-10 E11.9). Non insulin dependent. 02/22/20   Tonia Ghent, MD  citalopram (CELEXA) 10 MG tablet TAKE 1 TABLET BY MOUTH DAILY 08/19/22   Tonia Ghent, MD  furosemide (LASIX) 40 MG tablet Take 1 tablet (40 mg  total) by mouth daily. 04/14/22   Tonia Ghent, MD  glimepiride (AMARYL) 2 MG tablet Take 1 tablet (2 mg total) by mouth daily before breakfast. 09/09/21   Tonia Ghent, MD  glucose blood (CONTOUR NEXT TEST) test strip USE TO CHECK BLOOD SUGAR UP TO 4 TIMES DAILY AS  DIRECTED 05/15/22   Tonia Ghent, MD  Lancet Devices (MICROLET NEXT LANCING DEVICE) MISC USE TO CHECK BLOOD SUGAR UP TO 4 TIMES DAILY AS DIRECTED 12/16/21   Tonia Ghent, MD  metFORMIN (GLUCOPHAGE) 500 MG tablet TAKE 1 TO 2 TABLETS BY MOUTH  TWICE DAILY WITH MEALS 08/19/22   Tonia Ghent, MD  Omega-3 Fatty Acids (FISH OIL) 1000 MG CAPS Take 1 capsule (1,000 mg total) by mouth in the morning and at bedtime. 09/09/21   Tonia Ghent, MD  pantoprazole (PROTONIX) 20 MG tablet TAKE 1 TABLET BY MOUTH DAILY 07/11/22   Tonia Ghent, MD  Semaglutide,0.25 or 0.'5MG'$ /DOS, (OZEMPIC, 0.25 OR 0.5 MG/DOSE,) 2 MG/3ML SOPN INJECT SUBCUTANEOUSLY 0.5 MG  EVERY WEEK AS DIRECTED 08/14/22   Tonia Ghent, MD      Allergies    Ace inhibitors, Angiotensin receptor blockers, Penicillins, and Pravastatin    Review of Systems   Review of Systems  Physical Exam Updated Vital Signs BP (!) 168/100 (BP Location: Left Arm)   Pulse 98   Temp 98 F (36.7 C) (Oral)   Resp 18  SpO2 95%  Physical Exam Constitutional:      Appearance: He is well-developed.  HENT:     Head: Normocephalic and atraumatic.  Eyes:     Conjunctiva/sclera: Conjunctivae normal.  Cardiovascular:     Rate and Rhythm: Normal rate.  Pulmonary:     Effort: Pulmonary effort is normal. No respiratory distress.  Musculoskeletal:        General: Normal range of motion.     Cervical back: Normal range of motion.     Comments: Cervical Spine: Examination of the cervical spine reveals no bony abnormality, no edema, and no ecchymosis.  There is no step-off.  The patient has full active and passive range of motion of the cervical spine with flexion, extension, and right and  left bend with rotation.  There is no crepitus with range of motion exercises.  The patient is non-tender along the spinous process to palpation.  The patient has no paravertebral muscle spasm.  There is tenderness along left superior scapular border with no winging of the scapula..  The patient has a negative axial compression test.  The patient has a positive left Rilling's test.  Left upper Extremity: Examination of the left shoulder and arm showed no bony abnormality or edema.  The patient has normal active and passive motion with abduction, flexion, internal rotation, and external rotation.  The patient has no tenderness with motion.  The patient has a negative Hawkins test and a negative impingement test.  The patient has a negative drop arm test.  The patient is non-tender along the deltoid muscle.  There is no subacromial space tenderness with no AC joint tenderness.  The patient has no instability of the shoulder with anterior-posterior motion.  There is a negative sulcus sign.  The rotator cuff muscle strength is 5/5 with supraspinatus, 5/5 with internal rotation, and 5/5 with external rotation.  There is no crepitus with range of motion activities.      Skin:    General: Skin is warm.     Findings: No rash.  Neurological:     Mental Status: He is alert and oriented to person, place, and time.  Psychiatric:        Behavior: Behavior normal.        Thought Content: Thought content normal.     ED Results / Procedures / Treatments   Labs (all labs ordered are listed, but only abnormal results are displayed) Labs Reviewed - No data to display  EKG None  Radiology No results found.  Procedures Procedures    Medications Ordered in ED Medications - No data to display  ED Course/ Medical Decision Making/ A&P                             Medical Decision Making Risk Prescription drug management.   47 year old male with left superior scapular border discomfort consistent with  cervical radiculopathy.  Has positive Spurling's test to the left reproducing pain in the left suprascapular border and down the left forearm.  No weakness or neurological deficits.  He is placed on a prednisone taper given muscle relaxer along with Norco for moderate to severe pain.  He will follow-up with orthopedics or primary care provider in 1 week if no improvement.  Understands signs symptoms return to the ER for. Final Clinical Impression(s) / ED Diagnoses Final diagnoses:  Left cervical radiculopathy    Rx / DC Orders ED Discharge Orders  Ordered    predniSONE (DELTASONE) 10 MG tablet  Daily        08/21/22 1744    cyclobenzaprine (FLEXERIL) 5 MG tablet  3 times daily PRN        08/21/22 1744    HYDROcodone-acetaminophen (NORCO) 5-325 MG tablet  Every 6 hours PRN        08/21/22 1744              Duanne Guess, PA-C 08/21/22 1746    Harvest Dark, MD 08/21/22 1932

## 2022-08-21 NOTE — Discharge Instructions (Signed)
Please take medications as prescribed.  Follow-up with orthopedist or primary care provider in 1 week if no improvement.  Return to the ER for any weakness increasing pain worsening symptoms or any urgent changes in your health

## 2022-08-21 NOTE — ED Triage Notes (Signed)
Pt to ED via POV from home. Pt reports he has been having left shoulder pain after lifting some water out of truck and was seen at [facility]. Pt was given medication without relief. Pt reports pain has gotten and is now having left forearm swelling. Pt states yesterday he was helping someone move.

## 2022-08-22 DIAGNOSIS — G4733 Obstructive sleep apnea (adult) (pediatric): Secondary | ICD-10-CM | POA: Diagnosis not present

## 2022-08-26 ENCOUNTER — Telehealth: Payer: Self-pay | Admitting: *Deleted

## 2022-08-26 NOTE — Transitions of Care (Post Inpatient/ED Visit) (Signed)
   08/26/2022  Name: Kyle Murphy MRN: [id number] DOB: 1975-09-12  Today's TOC FU Call Status: Today's TOC FU Call Status:: Successful TOC FU Call Competed TOC FU Call Complete Date: 08/26/22  Transition Care Management Follow-up Telephone Call Date of Discharge: 08/22/22 Discharge Facility: [facility]) Type of Discharge: Emergency Department Reason for ED Visit: Other: (Cervical radiculopathy) How have you been since you were released from the hospital?: Better Any questions or concerns?: No  Items Reviewed: Did you receive and understand the discharge instructions provided?: Yes Medications obtained and verified?: Yes (Medications Reviewed) Any new allergies since your discharge?: No Dietary orders reviewed?: No Do you have support at home?: Yes People in Home: parent(s) Name of Support/Comfort Primary Source: [place] and Equipment/Supplies: [place] Ordered?: No Any new equipment or medical supplies ordered?: No  Functional Questionnaire: Do you need assistance with bathing/showering or dressing?: No Do you need assistance with meal preparation?: No Do you need assistance with eating?: No Do you have difficulty maintaining continence: No Do you need assistance with getting out of bed/getting out of a chair/moving?: No Do you have difficulty managing or taking your medications?: No  Follow up appointments reviewed: PCP Follow-up appointment confirmed?: Yes Date of PCP follow-up appointment?: 09/01/22 Follow-up Provider: Dr Elsie Stain [id number] 8:30 [facility] Follow-up appointment confirmed?: NA Do you need transportation to your follow-up appointment?: No Do you understand care options if your condition(s) worsen?: Yes-patient verbalized understanding  SDOH Interventions Today    Flowsheet Row Most Recent Value  SDOH Interventions   Food Insecurity Interventions Intervention Not Indicated   Housing Interventions Intervention Not Indicated  Transportation Interventions Intervention Not Indicated      Interventions Today    Flowsheet Row Most Recent Value  General Interventions   General Interventions Discussed/Reviewed General Interventions Discussed, General Interventions Reviewed, Doctor Visits  Doctor Visits Discussed/Reviewed Doctor Visits Discussed, Doctor Visits Reviewed  Pharmacy Interventions   Pharmacy Dicussed/Reviewed Medications and their functions      TOC Interventions Today    Flowsheet Row Most Recent Value  TOC Interventions   TOC Interventions Discussed/Reviewed Arranged PCP follow up less than 12 days/Care Guide scheduled       [facility] [phone]

## 2022-09-01 ENCOUNTER — Inpatient Hospital Stay: Payer: BC Managed Care – PPO | Admitting: Family Medicine

## 2022-09-01 LAB — HM DIABETES EYE EXAM

## 2022-09-02 ENCOUNTER — Encounter: Payer: Self-pay | Admitting: Family Medicine

## 2022-09-02 ENCOUNTER — Ambulatory Visit: Payer: BC Managed Care – PPO | Admitting: Family Medicine

## 2022-09-02 ENCOUNTER — Ambulatory Visit (INDEPENDENT_AMBULATORY_CARE_PROVIDER_SITE_OTHER)
Admission: RE | Admit: 2022-09-02 | Discharge: 2022-09-02 | Disposition: A | Discharge status: Home / Self Care | Payer: BC Managed Care – PPO | Source: Ambulatory Visit | Attending: Family Medicine | Admitting: Family Medicine

## 2022-09-02 VITALS — BP 118/62 | HR 90 | Temp 98.3°F | Ht 70.0 in | Wt 291.0 lb

## 2022-09-02 DIAGNOSIS — R202 Paresthesia of skin: Secondary | ICD-10-CM | POA: Diagnosis not present

## 2022-09-02 MED ORDER — GABAPENTIN 100 MG PO CAPS: 100.0000 mg | ORAL_CAPSULE | Freq: Three times a day (TID) | ORAL | 1 refills | Status: DC

## 2022-09-02 NOTE — Patient Instructions (Addendum)
Finish the prednisone.  Start gabapentin.  Go to the lab on the way out.   If you have mychart we'll likely use that to update you.    Take care.  Glad to see you.

## 2022-09-02 NOTE — Progress Notes (Signed)
L shoulder pain, radiation down the L arm.  Went to ER after ortho eval.  He had plain films done at ortho.  Then more pain after more lifting.  L upper trap area was puffy.  No R arm sx.  Still with paresthesia and pins and needles sensation on the L arm.  No L leg sx.  No absent sensation on the L arm.  Prednisone course done.  Sx some better but not resolved.  Sx going on at least 6 weeks total.  Sugar this AM 160, 116-160 recently.  None >200.  He was taking 10mg  prednisone a day, not a taper.    Some diarrhea, unclear if from metformin vs ozempic vs diet vs a combination. Cautions d/w pt.    Weight loss noted, intentional.    Meds, vitals, and allergies reviewed.   ROS: Per HPI unless specifically indicated in ROS section   Nad Ncat Neck supple, no LA Rrr Ctab Abd soft, not ttp No weakness LUE but paresthesia noted.  Normal strength in the arms bilaterally.  Skin well-perfused.

## 2022-09-03 NOTE — Assessment & Plan Note (Signed)
Concern for cervical source.  Check plain films.  Finish prednisone.  Start gabapentin.  Routine gabapentin cautions discussed with patient.  At this point still okay for outpatient follow-up.  No weakness.  He agrees to plan.

## 2022-09-15 ENCOUNTER — Encounter: Payer: Self-pay | Admitting: Family Medicine

## 2022-09-15 DIAGNOSIS — E119 Type 2 diabetes mellitus without complications: Secondary | ICD-10-CM

## 2022-09-15 MED ORDER — CONTOUR NEXT MONITOR W/DEVICE KIT: PACK | 0 refills | Status: AC

## 2022-09-17 ENCOUNTER — Other Ambulatory Visit: Payer: Self-pay | Admitting: Family Medicine

## 2022-09-22 DIAGNOSIS — G4733 Obstructive sleep apnea (adult) (pediatric): Secondary | ICD-10-CM | POA: Diagnosis not present

## 2022-10-17 ENCOUNTER — Other Ambulatory Visit: Payer: Self-pay | Admitting: Family Medicine

## 2022-10-17 ENCOUNTER — Encounter: Payer: Self-pay | Admitting: Family Medicine

## 2022-10-17 MED ORDER — METFORMIN HCL 500 MG PO TABS: 500.0000 mg | ORAL_TABLET | Freq: Two times a day (BID) | ORAL | Status: DC

## 2022-10-17 NOTE — Telephone Encounter (Signed)
Pt notified as instructed and pt voiced understanding. Pt will cb next wk with update.

## 2022-10-17 NOTE — Telephone Encounter (Signed)
Pt said for this past week blood sugars not always fasting have been from 70 - 105; pt is at work and does not have exact readings. Pt said when BS goes into 70s pt feels light headed and shaky. Last time that happened was 10/16/22. Today pt feels OK. Pt said now BS is 100 and pt is about to get something to eat. Pt said he has been eating a lot less; pt used to would eat 6 tacos and now he eats 2. Pt is taking metformin 500 mg taking 2 tabs bid and ozempic 0.5 mg once a wk and last took ozempic this morning. Pt did not take glimiperide 2 mg this morning. Pt said cannot tell a lot of difference since did not take glimiperide this morning. Pt has lost a lot of weight; pt said on 09/02/22  weighed 291 and today pt weighs 276. Pt request cb today with how should take meds. UC & ED precautions given and pt voiced understanding. Sending note to DR Theodosia Paling pool and will speak with either Dr Para March or Shanda Bumps CMA. [facility]

## 2022-10-17 NOTE — Telephone Encounter (Signed)
Would try metformin 500mg  BID, stop glimepiride.  Continue ozempic.  Update [place] next week.

## 2022-10-22 DIAGNOSIS — G4733 Obstructive sleep apnea (adult) (pediatric): Secondary | ICD-10-CM | POA: Diagnosis not present

## 2022-11-03 ENCOUNTER — Other Ambulatory Visit: Payer: Self-pay | Admitting: Family Medicine

## 2022-11-03 DIAGNOSIS — E119 Type 2 diabetes mellitus without complications: Secondary | ICD-10-CM

## 2022-11-04 ENCOUNTER — Other Ambulatory Visit (INDEPENDENT_AMBULATORY_CARE_PROVIDER_SITE_OTHER): Payer: BC Managed Care – PPO

## 2022-11-04 DIAGNOSIS — E790 Hyperuricemia without signs of inflammatory arthritis and tophaceous disease: Secondary | ICD-10-CM | POA: Diagnosis not present

## 2022-11-04 DIAGNOSIS — G4733 Obstructive sleep apnea (adult) (pediatric): Secondary | ICD-10-CM | POA: Diagnosis not present

## 2022-11-04 DIAGNOSIS — E1122 Type 2 diabetes mellitus with diabetic chronic kidney disease: Secondary | ICD-10-CM | POA: Insufficient documentation

## 2022-11-04 DIAGNOSIS — E119 Type 2 diabetes mellitus without complications: Secondary | ICD-10-CM

## 2022-11-04 DIAGNOSIS — R809 Proteinuria, unspecified: Secondary | ICD-10-CM | POA: Diagnosis not present

## 2022-11-04 DIAGNOSIS — N189 Chronic kidney disease, unspecified: Secondary | ICD-10-CM | POA: Diagnosis not present

## 2022-11-04 LAB — LIPID PANEL
Cholesterol: 165 mg/dL (ref 0–200)
HDL: 29.7 mg/dL — ABNORMAL LOW (ref >39.00)
LDL Cholesterol: 108 mg/dL — ABNORMAL HIGH (ref 0–99)
NonHDL: 134.83
Total CHOL/HDL Ratio: 6
Triglycerides: 135 mg/dL (ref 0.0–149.0)
VLDL: 27 mg/dL (ref 0.0–40.0)

## 2022-11-04 LAB — COMPREHENSIVE METABOLIC PANEL
ALT: 27 U/L (ref 0–53)
AST: 19 U/L (ref 0–37)
Albumin: 3.8 g/dL (ref 3.5–5.2)
Alkaline Phosphatase: 56 U/L (ref 39–117)
BUN: 13 mg/dL (ref 6–23)
CO2: 25 mEq/L (ref 19–32)
Calcium: 8.8 mg/dL (ref 8.4–10.5)
Chloride: 102 mEq/L (ref 96–112)
Creatinine, Ser: 0.71 mg/dL (ref 0.40–1.50)
GFR: 109.29 mL/min (ref >60.00)
Glucose, Bld: 120 mg/dL — ABNORMAL HIGH (ref 70–99)
Potassium: 3.7 mEq/L (ref 3.5–5.1)
Sodium: 136 mEq/L (ref 135–145)
Total Bilirubin: 0.5 mg/dL (ref 0.2–1.2)
Total Protein: 6.9 g/dL (ref 6.0–8.3)

## 2022-11-04 LAB — MICROALBUMIN / CREATININE URINE RATIO
Creatinine,U: 231.2 mg/dL
Microalb Creat Ratio: 33.5 mg/g — ABNORMAL HIGH (ref 0.0–30.0)
Microalb, Ur: 77.5 mg/dL — ABNORMAL HIGH (ref 0.0–1.9)

## 2022-11-04 LAB — HEMOGLOBIN A1C: Hgb A1c MFr Bld: 6.4 % (ref 4.6–6.5)

## 2022-11-05 NOTE — Progress Notes (Signed)
Brynda Heick T. Lovely Kerins, MD, CAQ Sports Medicine [facility] [address] [place], [address]  Phone: [phone]  FAX: [phone]  Ezri Kuni - 47 y.o. male  MRN [id number]  Date of Birth: 1975-11-27  Date: 11/06/2022  PCP: Joaquim Nam, MD  Referral: Joaquim Nam, MD  Chief Complaint  Patient presents with   Shoulder Pain    Left   Subjective:   Ramiz Spannagel is a 47 y.o. very pleasant male patient with Body mass index is 39.1 kg/m. who presents with the following:  Patient presents with ongoing left-sided shoulder pain.  L shoulder pain with radicular cervical pain.  Predominant pain is down the arm and to a lesser extent with degree in the shoulder blade region.  Some pain in the trapezius.  He denies a deep dull ache in the glenohumeral region.  No significant pain at the [facility] joint.  First started when he through up a case of water.  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   Has been to [facility] in [place].  X-ray with orthopedics only a few days ago, and at that point they sent him up for physical therapy and had him do some basic NSAIDs.  Asleep and tingling in the L arm -subjective tingling and a sensation of decreased sensation at times Shoulder blade pain on the L side.   Was taking motrin and muscle relaxers One round of steroids.   No history of trauma or fracture.  Spreading fingers weaker on the L side    Review of Systems is noted in the HPI, as appropriate  Objective:   BP 110/72 (BP Location: Right Arm, Patient Position: Sitting, Cuff Size: Large)   Pulse 68   Temp 98.1 F (36.7 C) (Temporal)   Ht 5\' 10"  (1.778 m)   Wt 272 lb 8 oz (123.6 kg)   SpO2 96%   BMI 39.10 kg/m   GEN: No acute distress; alert,appropriate. PULM: Breathing comfortably in no respiratory distress PSYCH: Normally interactive.    CERVICAL SPINE EXAM Range of motion: Flexion,  extension, lateral bending, and rotation: Mild decreased motion in all directions Pain with terminal motion: Yes Spinous Processes: NT SCM: NT Upper paracervical muscles: Tender to palpation on the left Upper traps: Tender palpation on the left C5-T1 intact, sensation and motor with the exception of decreased strength with opening fingers   Shoulder: L Inspection: No muscle wasting or winging Ecchymosis/edema: neg  AC joint, scapula, clavicle: NT Abduction: full, 5/5 Flexion: full, 5/5 IR, full, lift-off: 5/5 ER at neutral: full, 5/5 AC crossover and compression: neg Neer: neg Hawkins: neg Drop Test: neg Empty Can: neg Supraspinatus insertion: NT Bicipital groove: NT Speed's: neg Yergason's: neg Sulcus sign: neg Scapular dyskinesis: none C5-T1 intact Sensation intact Grip 5/5   Laboratory and Imaging Data:  Assessment and Plan:     ICD-10-CM   1. Left cervical radiculopathy  M54.12     2. Left hand weakness  R29.898     3. Tingling of left upper extremity  R20.2      Left-sided cervical radiculopathy down the left arm with some decreased strength at the hand and tingling in the upper extremity.  Not consistent with intra-articular pathology.  Increase Neurontin to 300 mg at night.  Burst of prednisone x 14 days. I will also refill his Flexeril.  Medication Management during today's office visit: Meds ordered this encounter  Medications  gabapentin (NEURONTIN) 300 MG capsule    Sig: Take 1 capsule (300 mg total) by mouth 3 (three) times daily.    Dispense:  30 capsule    Refill:  3   cyclobenzaprine (FLEXERIL) 5 MG tablet    Sig: Take 1-2 tablets (5-10 mg total) by mouth 3 (three) times daily as needed for muscle spasms.    Dispense:  40 tablet    Refill:  1   predniSONE (DELTASONE) 20 MG tablet    Sig: 2 tabs po for 7 days, then 1 tab po for 7 days    Dispense:  21 tablet    Refill:  0   Medications Discontinued During This Encounter  Medication  Reason   predniSONE (DELTASONE) 10 MG tablet Completed Course   cyclobenzaprine (FLEXERIL) 5 MG tablet Reorder   gabapentin (NEURONTIN) 100 MG capsule Reorder    Orders placed today for conditions managed today: No orders of the defined types were placed in this encounter.   Disposition: No follow-ups on file.  [employer] One speech-to-text software was used for transcription in this dictation.  Possible transcriptional errors can occur using Animal nutritionist.   Signed,  Elpidio Galea. Hydie Langan, MD   Outpatient Encounter Medications as of 11/06/2022  Medication Sig   allopurinol (ZYLOPRIM) 100 MG tablet Take 100 mg by mouth daily.   Blood Glucose Calibration (CONTOUR NEXT CONTROL) Normal SOLN USE TO CHECK CONTROLS ON  GLUCOSE METER   Blood Glucose Monitoring Suppl (CONTOUR NEXT MONITOR) w/Device KIT Use to check blood sugar up to four times daily as directed. (FOR ICD-10 E11.9). Non insulin dependent.   citalopram (CELEXA) 10 MG tablet TAKE 1 TABLET BY MOUTH DAILY   furosemide (LASIX) 40 MG tablet Take 1 tablet (40 mg total) by mouth daily.   glucose blood (CONTOUR NEXT TEST) test strip USE TO CHECK BLOOD SUGAR UP TO 4 TIMES DAILY AS  DIRECTED   Lancet Devices (MICROLET NEXT LANCING DEVICE) MISC USE TO CHECK BLOOD SUGAR UP TO 4 TIMES DAILY AS DIRECTED   metFORMIN (GLUCOPHAGE) 500 MG tablet Take 1 tablet (500 mg total) by mouth 2 (two) times daily with a meal.   Omega-3 Fatty Acids (FISH OIL) 1000 MG CAPS Take 1 capsule (1,000 mg total) by mouth in the morning and at bedtime.   pantoprazole (PROTONIX) 20 MG tablet TAKE 1 TABLET BY MOUTH DAILY   predniSONE (DELTASONE) 20 MG tablet 2 tabs po for 7 days, then 1 tab po for 7 days   Semaglutide,0.25 or 0.5MG /DOS, (OZEMPIC, 0.25 OR 0.5 MG/DOSE,) 2 MG/3ML SOPN INJECT SUBCUTANEOUSLY 0.5 MG  EVERY WEEK AS DIRECTED   [DISCONTINUED] gabapentin (NEURONTIN) 100 MG capsule Take 1 capsule (100 mg total) by mouth 3 (three) times daily.    cyclobenzaprine (FLEXERIL) 5 MG tablet Take 1-2 tablets (5-10 mg total) by mouth 3 (three) times daily as needed for muscle spasms.   gabapentin (NEURONTIN) 300 MG capsule Take 1 capsule (300 mg total) by mouth 3 (three) times daily.   [DISCONTINUED] cyclobenzaprine (FLEXERIL) 5 MG tablet Take 1-2 tablets (5-10 mg total) by mouth 3 (three) times daily as needed for muscle spasms. (Patient not taking: Reported on 11/06/2022)   [DISCONTINUED] predniSONE (DELTASONE) 10 MG tablet Take 1 tablet (10 mg total) by mouth daily. 6,5,4,3,2,1 six day taper   No facility-administered encounter medications on file as of 11/06/2022.

## 2022-11-06 ENCOUNTER — Encounter: Payer: Self-pay | Admitting: Family Medicine

## 2022-11-06 ENCOUNTER — Ambulatory Visit: Payer: BC Managed Care – PPO | Admitting: Family Medicine

## 2022-11-06 VITALS — BP 110/72 | HR 68 | Temp 98.1°F | Ht 70.0 in | Wt 272.5 lb

## 2022-11-06 DIAGNOSIS — M5412 Radiculopathy, cervical region: Secondary | ICD-10-CM

## 2022-11-06 DIAGNOSIS — R202 Paresthesia of skin: Secondary | ICD-10-CM | POA: Diagnosis not present

## 2022-11-06 DIAGNOSIS — R29898 Other symptoms and signs involving the musculoskeletal system: Secondary | ICD-10-CM

## 2022-11-06 MED ORDER — PREDNISONE 20 MG PO TABS: ORAL_TABLET | ORAL | 0 refills | Status: DC

## 2022-11-06 MED ORDER — CYCLOBENZAPRINE HCL 5 MG PO TABS: 5.0000 mg | ORAL_TABLET | Freq: Three times a day (TID) | ORAL | 1 refills | Status: DC | PRN

## 2022-11-06 MED ORDER — GABAPENTIN 300 MG PO CAPS: 300.0000 mg | ORAL_CAPSULE | Freq: Three times a day (TID) | ORAL | 3 refills | Status: DC

## 2022-11-13 ENCOUNTER — Encounter: Payer: BC Managed Care – PPO | Admitting: Family Medicine

## 2022-11-14 ENCOUNTER — Ambulatory Visit (INDEPENDENT_AMBULATORY_CARE_PROVIDER_SITE_OTHER): Payer: BC Managed Care – PPO | Admitting: Family Medicine

## 2022-11-14 ENCOUNTER — Encounter: Payer: Self-pay | Admitting: Family Medicine

## 2022-11-14 VITALS — BP 110/56 | HR 94 | Temp 97.8°F | Ht 70.0 in | Wt 275.0 lb

## 2022-11-14 DIAGNOSIS — E1129 Type 2 diabetes mellitus with other diabetic kidney complication: Secondary | ICD-10-CM

## 2022-11-14 DIAGNOSIS — E790 Hyperuricemia without signs of inflammatory arthritis and tophaceous disease: Secondary | ICD-10-CM

## 2022-11-14 DIAGNOSIS — R809 Proteinuria, unspecified: Secondary | ICD-10-CM

## 2022-11-14 DIAGNOSIS — Z0000 Encounter for general adult medical examination without abnormal findings: Secondary | ICD-10-CM | POA: Diagnosis not present

## 2022-11-14 DIAGNOSIS — E1122 Type 2 diabetes mellitus with diabetic chronic kidney disease: Secondary | ICD-10-CM

## 2022-11-14 DIAGNOSIS — Z7189 Other specified counseling: Secondary | ICD-10-CM

## 2022-11-14 DIAGNOSIS — F419 Anxiety disorder, unspecified: Secondary | ICD-10-CM

## 2022-11-14 MED ORDER — GABAPENTIN 300 MG PO CAPS: 300.0000 mg | ORAL_CAPSULE | Freq: Every day | ORAL | Status: DC

## 2022-11-14 NOTE — Patient Instructions (Signed)
Recheck in about 3 months, nonfasting labs ahead of time.  Take care.  Glad to see you. Thanks for your effort.

## 2022-11-14 NOTE — Progress Notes (Unsigned)
CPE- See plan.  Routine anticipatory guidance given to patient.  See health maintenance.  The possibility exists that previously documented standard health maintenance information may have been brought forward from a previous encounter into this note.  If needed, that same information has been updated to reflect the current situation based on today's encounter.    Tetanus 2018 Flu 2023 PNA 2019 shingles not due covid vaccine 2021 Colonoscopy 2023 PSA not due.   Living will d/w pt.  Mother designated if patient were incapacitated.  Diet and exercise d/w pt.    Renal function d/w pt.  Taking lasix daily.  BLE edema improved.   Still on allopurinol at baseline.    Mood d/w pt.  Mood is "fine."  Anxiety is better on med.  He is still caring for his father at baseline.    L arm is improving in the meantime, pain is clearly better.    Diabetes:  Using medications without difficulties: yes Hypoglycemic episodes:no Hyperglycemic episodes:no Feet problems: no Blood Sugars averaging: low 100s, higher with prednisone.   eye exam within last year: yes Exercise capacity is clearly better with lower weight.   Ozempic 0.5mg  weekly, metformin 500mg  BID.   Finishing prednisone soon.    PMH and SH reviewed  Meds, vitals, and allergies reviewed.   ROS: Per HPI.  Unless specifically indicated otherwise in HPI, the patient denies:  General: fever. Eyes: acute vision changes ENT: sore throat Cardiovascular: chest pain Respiratory: SOB GI: vomiting GU: dysuria Musculoskeletal: acute back pain Derm: acute rash Neuro: acute motor dysfunction Psych: worsening mood Endocrine: polydipsia Heme: bleeding Allergy: hayfever  GEN: nad, alert and oriented HEENT: ncat NECK: supple w/o LA CV: rrr. PULM: ctab, no inc wob ABD: soft, +bs EXT: Trace BLE edema SKIN: no acute rash  Diabetic foot exam: Normal inspection No skin breakdown No calluses  Normal DP pulses Normal sensation to light  touch and monofilament Nails normal

## 2022-11-16 NOTE — Assessment & Plan Note (Signed)
Living will d/w pt. Mother designated if patient were incapacitated.  

## 2022-11-16 NOTE — Assessment & Plan Note (Signed)
Tetanus 2018 Flu 2023 PNA 2019 shingles not due covid vaccine 2021 Colonoscopy 2023 PSA not due.   Living will d/w pt.  Mother designated if patient were incapacitated.  Diet and exercise d/w pt.

## 2022-11-16 NOTE — Assessment & Plan Note (Addendum)
Mood d/w pt.  Mood is "fine."  Anxiety is better on med.  He is still caring for his father at baseline.  Would continue citalopram as is.

## 2022-11-16 NOTE — Assessment & Plan Note (Signed)
Renal function d/w pt.  Taking lasix daily.  BLE edema improved.  Continue work on diabetic control.  Continue Lasix as is.

## 2022-11-16 NOTE — Assessment & Plan Note (Signed)
Exercise capacity is clearly better with lower weight.   Oxempic 0.5mg  weekly, metformin 500mg  BID.   Finishing prednisone soon.  A1c still controlled.  No change in medication at this point.  Recheck periodically.  Continue work on diet and exercise.

## 2022-11-16 NOTE — Assessment & Plan Note (Signed)
Continue allopurinol, we can recheck uric acid with next set of labs.

## 2022-11-22 DIAGNOSIS — G4733 Obstructive sleep apnea (adult) (pediatric): Secondary | ICD-10-CM | POA: Diagnosis not present

## 2023-01-20 DIAGNOSIS — M722 Plantar fascial fibromatosis: Secondary | ICD-10-CM | POA: Diagnosis not present

## 2023-01-20 DIAGNOSIS — M79672 Pain in left foot: Secondary | ICD-10-CM | POA: Diagnosis not present

## 2023-01-22 ENCOUNTER — Encounter (INDEPENDENT_AMBULATORY_CARE_PROVIDER_SITE_OTHER): Payer: Self-pay

## 2023-02-11 ENCOUNTER — Other Ambulatory Visit (INDEPENDENT_AMBULATORY_CARE_PROVIDER_SITE_OTHER): Payer: BC Managed Care – PPO

## 2023-02-11 DIAGNOSIS — R809 Proteinuria, unspecified: Secondary | ICD-10-CM

## 2023-02-11 DIAGNOSIS — E1129 Type 2 diabetes mellitus with other diabetic kidney complication: Secondary | ICD-10-CM | POA: Diagnosis not present

## 2023-02-11 LAB — BASIC METABOLIC PANEL
BUN: 13 mg/dL (ref 6–23)
CO2: 30 meq/L (ref 19–32)
Calcium: 8.9 mg/dL (ref 8.4–10.5)
Chloride: 101 meq/L (ref 96–112)
Creatinine, Ser: 0.74 mg/dL (ref 0.40–1.50)
GFR: 107.73 mL/min (ref >60.00)
Glucose, Bld: 110 mg/dL — ABNORMAL HIGH (ref 70–99)
Potassium: 3.8 meq/L (ref 3.5–5.1)
Sodium: 138 meq/L (ref 135–145)

## 2023-02-11 LAB — HEMOGLOBIN A1C: Hgb A1c MFr Bld: 6.5 % (ref 4.6–6.5)

## 2023-02-11 LAB — URIC ACID: Uric Acid, Serum: 8.3 mg/dL — ABNORMAL HIGH (ref 4.0–7.8)

## 2023-02-16 ENCOUNTER — Ambulatory Visit: Payer: BC Managed Care – PPO | Admitting: Family Medicine

## 2023-02-16 ENCOUNTER — Encounter: Payer: Self-pay | Admitting: Family Medicine

## 2023-02-16 VITALS — BP 140/80 | HR 88 | Temp 98.5°F | Ht 70.0 in | Wt 265.0 lb

## 2023-02-16 DIAGNOSIS — E1129 Type 2 diabetes mellitus with other diabetic kidney complication: Secondary | ICD-10-CM | POA: Diagnosis not present

## 2023-02-16 DIAGNOSIS — E790 Hyperuricemia without signs of inflammatory arthritis and tophaceous disease: Secondary | ICD-10-CM | POA: Diagnosis not present

## 2023-02-16 DIAGNOSIS — M549 Dorsalgia, unspecified: Secondary | ICD-10-CM | POA: Diagnosis not present

## 2023-02-16 DIAGNOSIS — Z113 Encounter for screening for infections with a predominantly sexual mode of transmission: Secondary | ICD-10-CM | POA: Diagnosis not present

## 2023-02-16 DIAGNOSIS — Z23 Encounter for immunization: Secondary | ICD-10-CM

## 2023-02-16 DIAGNOSIS — R809 Proteinuria, unspecified: Secondary | ICD-10-CM

## 2023-02-16 DIAGNOSIS — Z7984 Long term (current) use of oral hypoglycemic drugs: Secondary | ICD-10-CM

## 2023-02-16 DIAGNOSIS — Z7985 Long-term (current) use of injectable non-insulin antidiabetic drugs: Secondary | ICD-10-CM

## 2023-02-16 MED ORDER — GABAPENTIN 300 MG PO CAPS: 300.0000 mg | ORAL_CAPSULE | Freq: Every day | ORAL | 1 refills | Status: DC

## 2023-02-16 NOTE — Progress Notes (Signed)
Diabetes:  Using medications without difficulties: yes Hypoglycemic episodes:no Hyperglycemic episodes:no Feet problems: no Blood Sugars averaging: usually ~ 110 eye exam within last year: yes Weight loss noted.    Uric acid level 8.3, d/w pt at OV.  Prev was 10.1.  no ADE on allopurinol.  No gout flares.    Had been taking gabapentin for back pain that improved with weight loss.    D/w pt about STI screening.  No symptoms.    Flu shot today.   PMH and SH reviewed  Meds, vitals, and allergies reviewed.   ROS: Per HPI unless specifically indicated in ROS section   GEN: nad, alert and oriented HEENT: ncat NECK: supple w/o LA CV: rrr. PULM: ctab, no inc wob ABD: soft, +bs EXT: no edema SKIN: no acute rash  Diabetic foot exam: Normal inspection No skin breakdown No calluses  Normal DP pulses Normal sensation to light touch and monofilament Nails normal

## 2023-02-16 NOTE — Assessment & Plan Note (Signed)
Had been taking gabapentin for back pain that improved with weight loss.  Continue as is.

## 2023-02-16 NOTE — Assessment & Plan Note (Signed)
Uric acid level 8.3, d/w pt at OV.  Prev was 10.1.  no ADE on allopurinol.  No gout flares.   Continue allopurinol 100mg .

## 2023-02-16 NOTE — Assessment & Plan Note (Signed)
Recheck in 4 months.  A1c at the visit.  Continue metformin ozempic.  Weight loss noted.

## 2023-02-16 NOTE — Patient Instructions (Addendum)
Go to the lab on the way out.   If you have mychart we'll likely use that to update you.    Take care.  Glad to see you. Don't change your meds for now.  Recheck in 4 months.  A1c at the visit.

## 2023-02-16 NOTE — Assessment & Plan Note (Signed)
See notes on labs. 

## 2023-02-18 LAB — HIV ANTIBODY (ROUTINE TESTING W REFLEX): HIV 1&2 Ab, 4th Generation: NONREACTIVE

## 2023-02-18 LAB — RPR: RPR Ser Ql: NONREACTIVE

## 2023-02-18 LAB — C. TRACHOMATIS/N. GONORRHOEAE RNA
C. trachomatis RNA, TMA: NOT DETECTED
N. gonorrhoeae RNA, TMA: NOT DETECTED

## 2023-02-26 ENCOUNTER — Other Ambulatory Visit: Payer: Self-pay | Admitting: Family Medicine

## 2023-03-19 ENCOUNTER — Emergency Department: Payer: BC Managed Care – PPO

## 2023-03-19 ENCOUNTER — Telehealth: Payer: Self-pay | Admitting: Family Medicine

## 2023-03-19 ENCOUNTER — Encounter: Payer: Self-pay | Admitting: *Deleted

## 2023-03-19 ENCOUNTER — Emergency Department
Admission: EM | Admit: 2023-03-19 | Discharge: 2023-03-20 | Disposition: A | Discharge status: Home / Self Care | Payer: BC Managed Care – PPO | Attending: Emergency Medicine | Admitting: Emergency Medicine

## 2023-03-19 ENCOUNTER — Other Ambulatory Visit: Payer: Self-pay

## 2023-03-19 DIAGNOSIS — K56609 Unspecified intestinal obstruction, unspecified as to partial versus complete obstruction: Secondary | ICD-10-CM | POA: Diagnosis not present

## 2023-03-19 DIAGNOSIS — E119 Type 2 diabetes mellitus without complications: Secondary | ICD-10-CM | POA: Insufficient documentation

## 2023-03-19 DIAGNOSIS — R140 Abdominal distension (gaseous): Secondary | ICD-10-CM | POA: Diagnosis not present

## 2023-03-19 DIAGNOSIS — R109 Unspecified abdominal pain: Secondary | ICD-10-CM | POA: Insufficient documentation

## 2023-03-19 DIAGNOSIS — K5730 Diverticulosis of large intestine without perforation or abscess without bleeding: Secondary | ICD-10-CM | POA: Diagnosis not present

## 2023-03-19 DIAGNOSIS — Z9049 Acquired absence of other specified parts of digestive tract: Secondary | ICD-10-CM | POA: Diagnosis not present

## 2023-03-19 LAB — COMPREHENSIVE METABOLIC PANEL
ALT: 21 U/L (ref 0–44)
AST: 23 U/L (ref 15–41)
Albumin: 1.5 g/dL — ABNORMAL LOW (ref 3.5–5.0)
Alkaline Phosphatase: 76 U/L (ref 38–126)
Anion gap: 10 (ref 5–15)
BUN: 10 mg/dL (ref 6–20)
CO2: 25 mmol/L (ref 22–32)
Calcium: 7.7 mg/dL — ABNORMAL LOW (ref 8.9–10.3)
Chloride: 103 mmol/L (ref 98–111)
Creatinine, Ser: 0.91 mg/dL (ref 0.61–1.24)
GFR, Estimated: >60 mL/min (ref >60)
Glucose, Bld: 122 mg/dL — ABNORMAL HIGH (ref 70–99)
Potassium: 3.9 mmol/L (ref 3.5–5.1)
Sodium: 138 mmol/L (ref 135–145)
Total Bilirubin: 0.6 mg/dL (ref 0.3–1.2)
Total Protein: 5.6 g/dL — ABNORMAL LOW (ref 6.5–8.1)

## 2023-03-19 LAB — CBC
HCT: 49.9 % (ref 39.0–52.0)
Hemoglobin: 16.8 g/dL (ref 13.0–17.0)
MCH: 30.9 pg (ref 26.0–34.0)
MCHC: 33.7 g/dL (ref 30.0–36.0)
MCV: 91.9 fL (ref 80.0–100.0)
Platelets: 323 10*3/uL (ref 150–400)
RBC: 5.43 MIL/uL (ref 4.22–5.81)
RDW: 13.6 % (ref 11.5–15.5)
WBC: 9.3 10*3/uL (ref 4.0–10.5)
nRBC: 0 % (ref 0.0–0.2)

## 2023-03-19 LAB — URINALYSIS, ROUTINE W REFLEX MICROSCOPIC
Bacteria, UA: NONE SEEN
Bilirubin Urine: NEGATIVE
Glucose, UA: NEGATIVE mg/dL
Hgb urine dipstick: NEGATIVE
Ketones, ur: 5 mg/dL — AB
Leukocytes,Ua: NEGATIVE
Nitrite: NEGATIVE
Protein, ur: >=300 mg/dL — AB
Specific Gravity, Urine: 1.039 — ABNORMAL HIGH (ref 1.005–1.030)
pH: 5 (ref 5.0–8.0)

## 2023-03-19 LAB — LIPASE, BLOOD: Lipase: 37 U/L (ref 11–51)

## 2023-03-19 MED ORDER — IOHEXOL 350 MG/ML SOLN
100.0000 mL | Freq: Once | INTRAVENOUS | Status: AC | PRN
  Administered 2023-03-19: 100 mL via INTRAVENOUS

## 2023-03-19 NOTE — ED Provider Notes (Signed)
[facility] Provider Note    Event Date/Time   First MD Initiated Contact with Patient 03/19/23 2144     (approximate)   History   Chief Complaint: Abdominal Pain   HPI  Kyle Murphy is a 47 y.o. male with a history of diabetes who comes the ED complaining of abdominal bloating for the past 3 days.  Denies pain.  Associated with vomiting in the morning only and still able to eat, as well as black diarrhea.  Does not take blood thinners.  No fever.  No dizziness or syncope.  Had a prior cholecystectomy many years ago.     Physical Exam   Triage Vital Signs: ED Triage Vitals  Encounter Vitals Group     BP 03/19/23 1709 (!) 148/93     Systolic BP Percentile --      Diastolic BP Percentile --      Pulse Rate 03/19/23 1709 90     Resp 03/19/23 1709 18     Temp 03/19/23 1709 98.4 F (36.9 C)     Temp Source 03/19/23 1709 Oral     SpO2 03/19/23 1709 95 %     Weight 03/19/23 1705 295 lb (133.8 kg)     Height 03/19/23 1705 5\' 10"  (1.778 m)     Head Circumference --      Peak Flow --      Pain Score 03/19/23 1705 0     Pain Loc --      Pain Education --      Exclude from Growth Chart --     Most recent vital signs: Vitals:   03/19/23 1709 03/19/23 2137  BP: (!) 148/93 (!) 135/91  Pulse: 90 81  Resp: 18 16  Temp: 98.4 F (36.9 C)   SpO2: 95% 97%    General: Awake, no distress.  CV:  Good peripheral perfusion.  Regular rate and rhythm Resp:  Normal effort.  Clear to auscultation bilaterally Abd:  Moderately distended and tympanitic to percussion.  Soft and nontender. Other:  Moist oral mucosa   ED Results / Procedures / Treatments   Labs (all labs ordered are listed, but only abnormal results are displayed) Labs Reviewed  COMPREHENSIVE METABOLIC PANEL - Abnormal; Notable for the following components:      Result Value   Glucose, Bld 122 (*)    Calcium 7.7 (*)    Total Protein 5.6 (*)    Albumin 1.5 (*)    All other  components within normal limits  URINALYSIS, ROUTINE W REFLEX MICROSCOPIC - Abnormal; Notable for the following components:   Color, Urine YELLOW (*)    APPearance HAZY (*)    Specific Gravity, Urine 1.039 (*)    Ketones, ur 5 (*)    Protein, ur >=300 (*)    All other components within normal limits  LIPASE, BLOOD  CBC     EKG    RADIOLOGY CT abdomen pelvis pending   PROCEDURES:  Procedures   MEDICATIONS ORDERED IN ED: Medications - No data to display   IMPRESSION / MDM / ASSESSMENT AND PLAN / ED COURSE  I reviewed the triage vital signs and the nursing notes.  DDx: Bowel obstruction, constipation, electrolyte abnormality, AKI, dehydration  Patient's presentation is most consistent with acute presentation with potential threat to life or bodily function.  Diabetic patient with prior cholecystectomy comes to the ED complaining of abdominal bloating for the past few days associated with vomiting.  Exam does reveal abdominal  distention.  Serum labs are unremarkable, urinalysis concentrated but otherwise benign.  He is seeing nephrology already for proteinuria.  Will obtain CT scan to evaluate for SBO.       FINAL CLINICAL IMPRESSION(S) / ED DIAGNOSES   Final diagnoses:  Abdominal bloating     Rx / DC Orders   ED Discharge Orders     None        Note:  This document was prepared using Dragon voice recognition software and may include unintentional dictation errors.   Sharman Cheek, MD 03/19/23 2221

## 2023-03-19 NOTE — Telephone Encounter (Signed)
Per access nurse note pt agreed to go to ED as access nurse disposition. Sending note to Dr Para March and Para March pool.

## 2023-03-19 NOTE — ED Triage Notes (Signed)
Pt has abd pain with bloating.  Pt also reports black diarrhea.  Sx for 2 days.  Vomited x 3 today.  Pt alert   speech clear  no chest pain or sob.

## 2023-03-19 NOTE — Telephone Encounter (Signed)
FYI: This call has been transferred to Access Nurse. Once the result note has been entered staff can address the message at that time.  Patient called in with the following symptoms:  Red Word:abdominal pain, swelling, abd feels hard    Please advise at Mobile [phone] (mobile)  Message is routed to Provider Pool and [facility] Triage

## 2023-03-19 NOTE — Telephone Encounter (Signed)
Noted. Thanks.

## 2023-03-19 NOTE — ED Notes (Signed)
ED Provider at bedside. 

## 2023-03-20 ENCOUNTER — Ambulatory Visit: Payer: BC Managed Care – PPO | Admitting: Primary Care

## 2023-03-20 ENCOUNTER — Encounter: Payer: Self-pay | Admitting: Primary Care

## 2023-03-20 ENCOUNTER — Ambulatory Visit: Payer: BC Managed Care – PPO | Admitting: Family Medicine

## 2023-03-20 VITALS — BP 130/82 | HR 78 | Temp 98.0°F | Ht 70.0 in | Wt 299.0 lb

## 2023-03-20 DIAGNOSIS — G4730 Sleep apnea, unspecified: Secondary | ICD-10-CM | POA: Diagnosis not present

## 2023-03-20 NOTE — ED Notes (Signed)
ED Provider at bedside. 

## 2023-03-20 NOTE — Patient Instructions (Signed)
Very important you resume wearing CPAP every night 4-6 hours or longer  Resume Lasix (Furosemide) daily as prescribed Continue diabetes medications Continue to work on weight loss efforts as you had been   Follow-up 3 months with Beth NP   CPAP and BIPAP Information CPAP and BIPAP are methods that use air pressure to keep your airways open and to help you breathe well. CPAP and BIPAP use different amounts of pressure. Your health care provider will tell you whether CPAP or BIPAP would be more helpful for you. CPAP stands for "continuous positive airway pressure." With CPAP, the amount of pressure stays the same while you breathe in (inhale) and out (exhale). BIPAP stands for "bi-level positive airway pressure." With BIPAP, the amount of pressure will be higher when you inhale and lower when you exhale. This allows you to take larger breaths. CPAP or BIPAP may be used in the hospital, or your health care provider may want you to use it at home. You may need to have a sleep study before your health care provider can order a machine for you to use at home. What are the advantages? CPAP or BIPAP can be helpful if you have: Sleep apnea. Chronic obstructive pulmonary disease (COPD). Heart failure. Medical conditions that cause muscle weakness, including muscular dystrophy or amyotrophic lateral sclerosis (ALS). Other problems that cause breathing to be shallow, weak, abnormal, or difficult. CPAP and BIPAP are most commonly used for obstructive sleep apnea (OSA) to keep the airways from collapsing when the muscles relax during sleep. What are the risks? Generally, this is a safe treatment. However, problems may occur, including: Irritated skin or skin sores if the mask does not fit properly. Dry or stuffy nose or nosebleeds. Dry mouth. Feeling gassy or bloated. Sinus or lung infection if the equipment is not cleaned properly. When should CPAP or BIPAP be used? In most cases, the mask only needs  to be worn during sleep. Generally, the mask needs to be worn throughout the night and during any daytime naps. People with certain medical conditions may also need to wear the mask at other times, such as when they are awake. Follow instructions from your health care provider about when to use the machine. What happens during CPAP or BIPAP?  Both CPAP and BIPAP are provided by a small machine with a flexible plastic tube that attaches to a plastic mask that you wear. Air is blown through the mask into your nose or mouth. The amount of pressure that is used to blow the air can be adjusted on the machine. Your health care provider will set the pressure setting and help you find the best mask for you. Tips for using the mask Because the mask needs to be snug, some people feel trapped or closed-in (claustrophobic) when first using the mask. If you feel this way, you may need to get used to the mask. One way to do this is to hold the mask loosely over your nose or mouth and then gradually apply the mask more snugly. You can also gradually increase the amount of time that you use the mask. Masks are available in various types and sizes. If your mask does not fit well, talk with your health care provider about getting a different one. Some common types of masks include: Full face masks, which fit over the mouth and nose. Nasal masks, which fit over the nose. Nasal pillow or prong masks, which fit into the nostrils. If you are using a  mask that fits over your nose and you tend to breathe through your mouth, a chin strap may be applied to help keep your mouth closed. Use a skin barrier to protect your skin as told by your health care provider. Some CPAP and BIPAP machines have alarms that may sound if the mask comes off or develops a leak. If you have trouble with the mask, it is very important that you talk with your health care provider about finding a way to make the mask easier to tolerate. Do not stop  using the mask. There could be a negative impact on your health if you stop using the mask. Tips for using the machine Place your CPAP or BIPAP machine on a secure table or stand near an electrical outlet. Know where the on/off switch is on the machine. Follow instructions from your health care provider about how to set the pressure on your machine and when you should use it. Do not eat or drink while the CPAP or BIPAP machine is on. Food or fluids could get pushed into your lungs by the pressure of the CPAP or BIPAP. For home use, CPAP and BIPAP machines can be rented or purchased through home health care companies. Many different brands of machines are available. Renting a machine before purchasing may help you find out which particular machine works well for you. Your health insurance company may also decide which machine you may get. Keep the CPAP or BIPAP machine and attachments clean. Ask your health care provider for specific instructions. Check the humidifier if you have a dry stuffy nose or nosebleeds. Make sure it is working correctly. Follow these instructions at home: Take over-the-counter and prescription medicines only as told by your health care provider. Ask if you can take sinus medicine if your sinuses are blocked. Do not use any products that contain nicotine or tobacco. These products include cigarettes, chewing tobacco, and vaping devices, such as e-cigarettes. If you need help quitting, ask your health care provider. Keep all follow-up visits. This is important. Contact a health care provider if: You have redness or pressure sores on your head, face, mouth, or nose from the mask or head gear. You have trouble using the CPAP or BIPAP machine. You cannot tolerate wearing the CPAP or BIPAP mask. Someone tells you that you snore even when wearing your CPAP or BIPAP. Get help right away if: You have trouble breathing. You feel confused. Summary CPAP and BIPAP are methods that  use air pressure to keep your airways open and to help you breathe well. If you have trouble with the mask, it is very important that you talk with your health care provider about finding a way to make the mask easier to tolerate. Do not stop using the mask. There could be a negative impact to your health if you stop using the mask. Follow instructions from your health care provider about when to use the machine. This information is not intended to replace advice given to you by your health care provider. Make sure you discuss any questions you have with your health care provider. Document Revised: 01/02/2021 Document Reviewed: 05/04/2020 Elsevier Patient Education  2023 [employer].
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
## 2023-03-20 NOTE — ED Provider Notes (Signed)
Patient received in signout from Dr. Scotty Court pending CT to assess for SBO in the setting of abdominal bloating.  CT is essentially normal.  No signs of SBO.  Borderline lymph nodes and trace free fluid of uncertain etiology.  On reassessment, patient reports resolution of symptoms and feeling much better since he has been here.  He is benign exam.  Suitable for outpatient management.  Discussed return precautions.   Kyle Prairie, MD 03/20/23 [phone]

## 2023-03-23 ENCOUNTER — Encounter: Payer: Self-pay | Admitting: Family Medicine

## 2023-03-26 ENCOUNTER — Encounter: Payer: Self-pay | Admitting: Family Medicine

## 2023-03-26 ENCOUNTER — Ambulatory Visit: Payer: BC Managed Care – PPO | Admitting: Family Medicine

## 2023-03-26 VITALS — BP 134/70 | HR 82 | Temp 98.9°F | Ht 70.0 in | Wt 313.8 lb

## 2023-03-26 DIAGNOSIS — R140 Abdominal distension (gaseous): Secondary | ICD-10-CM | POA: Diagnosis not present

## 2023-03-26 NOTE — Patient Instructions (Addendum)
Stop ozempic for the next 3 weeks.  Please update me in about 1 week, sooner if needed.  Try taking 2 doses of lasix a day for the next 3 days.  Then go back to once a day.

## 2023-03-26 NOTE — Progress Notes (Signed)
Still on metformin twice a day and ozempic 0.5mg  weekly.  No news meds.    Had a cholecystectomy many years ago.  Recent onset abd bloating. Regurgitating acidic material episodically.  Recent emergency room notes reviewed with patient.  Recent imaging report noted and discussed.    IMPRESSION: 1. Colonic diverticulosis with no acute diverticulitis. 2. Nonspecific trace right lower quadrant free fluid. The appendix is not definitely identified. 3. Upper limits of normal/prominent but nonenlarged bilateral inguinal and [place] lymph nodes. Recommend attention on follow-up.  Meds, vitals, and allergies reviewed.   ROS: Per HPI unless specifically indicated in ROS section   Nad Ncat Neck supple, no LA Rrr Ctab Abd Protuberant with audible bowel sounds.  Not tender.  No rash. No jaundice. 1-2+ BLE edema.

## 2023-03-26 NOTE — Assessment & Plan Note (Signed)
With lower extremity edema noted.  He has been regarding septic material.  Unclear if his symptoms are related to or exacerbated by Ozempic.  Discussed.  Reasonable to stop ozempic for the next 3 weeks.  I asked him to please update me in about 1 week, sooner if needed.  Reasonable to try taking 2 doses of lasix a day for the next 3 days.  Then go back to once a day.  He agrees with plan.  Okay for outpatient follow-up.

## 2023-04-02 ENCOUNTER — Telehealth: Payer: Self-pay | Admitting: Family Medicine

## 2023-04-02 NOTE — Telephone Encounter (Signed)
Thanks for the update.  Glad to hear he is feeling better.  I added Ozempic to his intolerance list.  I would stay off Ozempic but continue taking metformin as is.  If he has significant sugar elevation in the meantime with his current dose of metformin then let me know.  If his sugars are controlled at home and he is feeling well then we can recheck his labs at his appointment in January.

## 2023-04-02 NOTE — Telephone Encounter (Signed)
Patient called in to give a Dr. Para March a follow up. He stated that his stomach is better now. He stated that he is going to the bathroom fine and not hurting like he use to. Thank you!

## 2023-04-03 NOTE — Telephone Encounter (Signed)
Called number not in service. Will send patient my chart to give office a call.

## 2023-04-03 NOTE — Telephone Encounter (Signed)
Called # provided and it says the # is not in service

## 2023-04-06 NOTE — Telephone Encounter (Signed)
Noted, would continue as is.  Thanks.

## 2023-04-06 NOTE — Telephone Encounter (Signed)
Patient called in returning a call he received. Relayed Dr. Para March message below to patient. He stated that so far everything is good and that his blood sugar this morning was 118.

## 2023-04-19 ENCOUNTER — Encounter: Payer: Self-pay | Admitting: *Deleted

## 2023-04-19 ENCOUNTER — Ambulatory Visit
Admission: EM | Admit: 2023-04-19 | Discharge: 2023-04-19 | Disposition: A | Discharge status: Home / Self Care | Payer: BC Managed Care – PPO | Attending: Emergency Medicine | Admitting: Emergency Medicine

## 2023-04-19 DIAGNOSIS — J069 Acute upper respiratory infection, unspecified: Secondary | ICD-10-CM | POA: Diagnosis not present

## 2023-04-19 MED ORDER — PREDNISONE 20 MG PO TABS: 40.0000 mg | ORAL_TABLET | Freq: Every day | ORAL | 0 refills | Status: DC

## 2023-04-19 MED ORDER — AZITHROMYCIN 250 MG PO TABS: 250.0000 mg | ORAL_TABLET | Freq: Every day | ORAL | 0 refills | Status: DC

## 2023-04-19 MED ORDER — PROMETHAZINE-DM 6.25-15 MG/5ML PO SYRP: 5.0000 mL | ORAL_SOLUTION | Freq: Four times a day (QID) | ORAL | 0 refills | Status: DC | PRN

## 2023-04-19 MED ORDER — BENZONATATE 100 MG PO CAPS: 100.0000 mg | ORAL_CAPSULE | Freq: Three times a day (TID) | ORAL | 0 refills | Status: DC

## 2023-04-19 NOTE — Discharge Instructions (Signed)
Your symptoms today are most likely being caused by a virus and should steadily improve in time it can take up to 7 to 10 days before you truly start to see a turnaround however things will get better we will begin azithromycin to provide coverage for bacteria and keep symptoms from progressing to more serious illness such as pneumonia  You may take prednisone every morning with food for 5 days to open and relax airway should calm persistence of cough  You may use Tessalon pill every 8 hours as needed to help calm your coughing  You may use cough syrup primarily at bedtime but may use throughout the day if it does not make you feel very drowsy    You can take Tylenol and/or Ibuprofen as needed for fever reduction and pain relief.   For cough: honey 1/2 to 1 teaspoon (you can dilute the honey in water or another fluid).  You can also use guaifenesin and dextromethorphan for cough. You can use a humidifier for chest congestion and cough.  If you don't have a humidifier, you can sit in the bathroom with the hot shower running.      For sore throat: try warm salt water gargles, cepacol lozenges, throat spray, warm tea or water with lemon/honey, popsicles or ice, or OTC cold relief medicine for throat discomfort.   For congestion: take a daily anti-histamine like Zyrtec, Claritin, and a oral decongestant, such as pseudoephedrine.  You can also use Flonase 1-2 sprays in each nostril daily.   It is important to stay hydrated: drink plenty of fluids (water, gatorade/powerade/pedialyte, juices, or teas) to keep your throat moisturized and help further relieve irritation/discomfort.

## 2023-04-19 NOTE — ED Triage Notes (Signed)
Patient states cough for about 1 week, just started losing voice on yesterday.  Has tried OTC cough meds but no relief

## 2023-04-19 NOTE — ED Provider Notes (Addendum)
Renaldo Fiddler    CSN: [id number] Arrival date & time: 04/19/23  1411      History   Chief Complaint Chief Complaint  Patient presents with   Cough    HPI Kyle Murphy is a 47 y.o. male.   Patient presents for evaluation of a nonproductive cough present for 7 days, began to experience hoarseness 1 day ago.  Cough worsening and interfering with sleep.  Has attempted use of Robitussin, Mucinex and Tylenol.  Known sick contact prior.  Denies fever, ear pain, sore throat, congestion, shortness of breath or wheezing.  Past Medical History:  Diagnosis Date   Anxiety    Diabetes (HCC)    History of heartburn    HLD (hyperlipidemia)    Leg cramps    history of   Migraines    with photo and phonophobia   Multiple thyroid nodules    Plantar fasciitis    L foot, injected at foot center 2014   Salmonella enteritis 2011   stool culture positive    Patient Active Problem List   Diagnosis Date Noted   Abdominal bloating 03/26/2023   Routine screening for STI (sexually transmitted infection) 02/16/2023   Chronic kidney disease due to type 2 diabetes mellitus (HCC) 11/04/2022   Hyperuricemia 11/04/2022   Severe sleep apnea 12/25/2021   Lower extremity edema 12/25/2021   Proteinuria 11/15/2021   Colon cancer screening    Rectal polyp    Statin intolerance 09/11/2021   Loud snoring 09/11/2021   Metatarsalgia 05/04/2019   Rash 01/30/2019   Sciatica of right side 11/07/2018   Lower urinary tract symptoms (LUTS) 07/30/2018   Allergy 07/30/2018   Lip swelling 04/20/2018   Skin lesion 01/27/2018   Hematuria 07/19/2017   Fatty liver 07/19/2017   Anxiety 07/26/2016   Diabetes mellitus with renal manifestation (HCC) 03/30/2016   Arm paresthesia, left 03/30/2016   Advance care planning 08/09/2014   GERD (gastroesophageal reflux disease) 08/09/2014   Cough 11/28/2013   Plantar fasciitis 09/23/2013   Routine general medical examination at a health care facility  04/01/2011   Back pain 04/01/2011   Hyperlipidemia 03/28/2010   Multinodular goiter 03/28/2010    Past Surgical History:  Procedure Laterality Date   CHOLECYSTECTOMY     COLONOSCOPY WITH PROPOFOL N/A 10/11/2021   Procedure: COLONOSCOPY WITH PROPOFOL;  Surgeon: Toney Reil, MD;  Location: [facility] ENDOSCOPY;  Service: Gastroenterology;  Laterality: N/A;   TONSILLECTOMY         Home Medications    Prior to Admission medications   Medication Sig Start Date End Date Taking? Authorizing Provider  azithromycin (ZITHROMAX) 250 MG tablet Take 1 tablet (250 mg total) by mouth daily. Take first 2 tablets together, then 1 every day until finished. 04/19/23  Yes Alize Borrayo R, NP  benzonatate (TESSALON) 100 MG capsule Take 1 capsule (100 mg total) by mouth every 8 (eight) hours. 04/19/23  Yes Juleah Paradise R, NP  predniSONE (DELTASONE) 20 MG tablet Take 2 tablets (40 mg total) by mouth daily. 04/19/23  Yes Gennesis Hogland R, NP  promethazine-dextromethorphan (PROMETHAZINE-DM) 6.25-15 MG/5ML syrup Take 5 mLs by mouth 4 (four) times daily as needed for cough. 04/19/23  Yes Denine Brotz R, NP  allopurinol (ZYLOPRIM) 100 MG tablet Take 100 mg by mouth daily. 10/12/22   [provider]  Blood Glucose Calibration (CONTOUR NEXT CONTROL) Normal SOLN USE TO CHECK CONTROLS ON  GLUCOSE METER 11/05/21   Joaquim Nam, MD  Blood Glucose Monitoring Suppl (  CONTOUR NEXT MONITOR) w/Device KIT Use to check blood sugar up to four times daily as directed. (FOR ICD-10 E11.9). Non insulin dependent. 09/15/22   Joaquim Nam, MD  citalopram (CELEXA) 10 MG tablet TAKE 1 TABLET BY MOUTH DAILY 08/19/22   Joaquim Nam, MD  furosemide (LASIX) 40 MG tablet Take 1 tablet (40 mg total) by mouth daily. 04/14/22   Joaquim Nam, MD  gabapentin (NEURONTIN) 300 MG capsule Take 1 capsule (300 mg total) by mouth at bedtime. 02/16/23   Joaquim Nam, MD  glucose blood (CONTOUR NEXT TEST) test strip USE TO  CHECK BLOOD SUGAR UP TO 4 TIMES DAILY AS  DIRECTED 05/15/22   Joaquim Nam, MD  Lancet Devices (MICROLET NEXT LANCING DEVICE) MISC USE TO CHECK BLOOD SUGAR UP TO 4 TIMES DAILY AS DIRECTED 12/16/21   Joaquim Nam, MD  metFORMIN (GLUCOPHAGE) 500 MG tablet Take 1 tablet (500 mg total) by mouth 2 (two) times daily with a meal. 10/17/22   Joaquim Nam, MD  Omega-3 Fatty Acids (FISH OIL) 1000 MG CAPS Take 1 capsule (1,000 mg total) by mouth in the morning and at bedtime. 09/09/21   Joaquim Nam, MD  pantoprazole (PROTONIX) 20 MG tablet TAKE 1 TABLET BY MOUTH DAILY 07/11/22   Joaquim Nam, MD    Family History Family History  Problem Relation Age of Onset   Hypertension Mother    Depression Mother        bipolar   Diabetes Mother        diet controlled   COPD Mother    Thyroid disease Mother    Diabetes Father    Thyroid disease Father    Heart disease Father    Thyroid disease Brother    Hypertension Brother    Diabetes Brother    Colon cancer Neg Hx    Prostate cancer Neg Hx     Social History Social History   Tobacco Use   Smoking status: Never   Smokeless tobacco: Never  Vaping Use   Vaping status: Never Used  Substance Use Topics   Alcohol use: No    Alcohol/week: 0.0 standard drinks of alcohol    Comment: occassionally   Drug use: No     Allergies   Ace inhibitors, Angiotensin receptor blockers, Ozempic (0.25 or 0.5 mg-dose) [semaglutide(0.25 or 0.5mg -dos)], Penicillins, and Pravastatin   Review of Systems Review of Systems   Physical Exam Triage Vital Signs ED Triage Vitals  Encounter Vitals Group     BP 04/19/23 1429 (!) 143/87     Systolic BP Percentile --      Diastolic BP Percentile --      Pulse Rate 04/19/23 1429 79     Resp 04/19/23 1429 18     Temp 04/19/23 1429 98.8 F (37.1 C)     Temp Source 04/19/23 1429 Oral     SpO2 04/19/23 1429 95 %     Weight 04/19/23 1431 (!) 302 lb 6.4 oz (137.2 kg)     Height 04/19/23 1431 5\' 10"  (1.778  m)     Head Circumference --      Peak Flow --      Pain Score 04/19/23 1430 0     Pain Loc --      Pain Education --      Exclude from Growth Chart --    No data found.  Updated Vital Signs BP (!) 143/87 (BP Location: Left Arm)   Pulse  79   Temp 98.8 F (37.1 C) (Oral)   Resp 18   Ht 5\' 10"  (1.778 m)   Wt (!) 302 lb 6.4 oz (137.2 kg)   SpO2 95%   BMI 43.39 kg/m   Visual Acuity Right Eye Distance:   Left Eye Distance:   Bilateral Distance:    Right Eye Near:   Left Eye Near:    Bilateral Near:     Physical Exam Constitutional:      Appearance: Normal appearance.  HENT:     Head: Normocephalic.     Right Ear: Tympanic membrane, ear canal and external ear normal.     Left Ear: Tympanic membrane, ear canal and external ear normal.     Nose: Nose normal.     Mouth/Throat:     Pharynx: Posterior oropharyngeal erythema present. No oropharyngeal exudate.  Eyes:     Extraocular Movements: Extraocular movements intact.  Cardiovascular:     Rate and Rhythm: Normal rate and regular rhythm.     Pulses: Normal pulses.     Heart sounds: Normal heart sounds.  Pulmonary:     Effort: Pulmonary effort is normal.     Breath sounds: Normal breath sounds.  Musculoskeletal:        General: Normal range of motion.  Skin:    General: Skin is warm and dry.  Neurological:     Mental Status: He is alert and oriented to person, place, and time. Mental status is at baseline.      UC Treatments / Results  Labs (all labs ordered are listed, but only abnormal results are displayed) Labs Reviewed - No data to display  EKG   Radiology No results found.  Procedures Procedures (including critical care time)  Medications Ordered in UC Medications - No data to display  Initial Impression / Assessment and Plan / UC Course  I have reviewed the triage vital signs and the nursing notes.  Pertinent labs & imaging results that were available during my care of the patient were  reviewed by me and considered in my medical decision making (see chart for details).  Acute URI  Patient is in no signs of distress nor toxic appearing.  Vital signs are stable.  Low suspicion for pneumonia, pneumothorax or bronchitis and therefore will defer imaging.  Azithromycin prophylactically as well as prednisone Tessalon and Promethazine DM.May use additional over-the-counter medications as needed for supportive care.  May follow-up with urgent care as needed if symptoms persist or worsen.  Note given.   Final Clinical Impressions(s) / UC Diagnoses   Final diagnoses:  Acute URI     Discharge Instructions      Your symptoms today are most likely being caused by a virus and should steadily improve in time it can take up to 7 to 10 days before you truly start to see a turnaround however things will get better we will begin azithromycin to provide coverage for bacteria and keep symptoms from progressing to more serious illness such as pneumonia  You may take prednisone every morning with food for 5 days to open and relax airway should calm persistence of cough  You may use Tessalon pill every 8 hours as needed to help calm your coughing  You may use cough syrup primarily at bedtime but may use throughout the day if it does not make you feel very drowsy    You can take Tylenol and/or Ibuprofen as needed for fever reduction and pain relief.   For cough:  honey 1/2 to 1 teaspoon (you can dilute the honey in water or another fluid).  You can also use guaifenesin and dextromethorphan for cough. You can use a humidifier for chest congestion and cough.  If you don't have a humidifier, you can sit in the bathroom with the hot shower running.      For sore throat: try warm salt water gargles, cepacol lozenges, throat spray, warm tea or water with lemon/honey, popsicles or ice, or OTC cold relief medicine for throat discomfort.   For congestion: take a daily anti-histamine like Zyrtec,  Claritin, and a oral decongestant, such as pseudoephedrine.  You can also use Flonase 1-2 sprays in each nostril daily.   It is important to stay hydrated: drink plenty of fluids (water, gatorade/powerade/pedialyte, juices, or teas) to keep your throat moisturized and help further relieve irritation/discomfort.    ED Prescriptions     Medication Sig Dispense Auth. Provider   azithromycin (ZITHROMAX) 250 MG tablet Take 1 tablet (250 mg total) by mouth daily. Take first 2 tablets together, then 1 every day until finished. 6 tablet Zoye Chandra R, NP   predniSONE (DELTASONE) 20 MG tablet Take 2 tablets (40 mg total) by mouth daily. 10 tablet Lua Feng R, NP   benzonatate (TESSALON) 100 MG capsule Take 1 capsule (100 mg total) by mouth every 8 (eight) hours. 21 capsule Abou Sterkel R, NP   promethazine-dextromethorphan (PROMETHAZINE-DM) 6.25-15 MG/5ML syrup Take 5 mLs by mouth 4 (four) times daily as needed for cough. 118 mL Yunis Voorheis, Elita Boone, NP      PDMP not reviewed this encounter.   Valinda Hoar, NP 04/19/23 1507    Valinda Hoar, NP 04/19/23 1507

## 2023-04-24 DIAGNOSIS — E790 Hyperuricemia without signs of inflammatory arthritis and tophaceous disease: Secondary | ICD-10-CM | POA: Diagnosis not present

## 2023-04-24 DIAGNOSIS — E1129 Type 2 diabetes mellitus with other diabetic kidney complication: Secondary | ICD-10-CM | POA: Diagnosis not present

## 2023-04-24 DIAGNOSIS — R809 Proteinuria, unspecified: Secondary | ICD-10-CM | POA: Diagnosis not present

## 2023-04-24 DIAGNOSIS — G4733 Obstructive sleep apnea (adult) (pediatric): Secondary | ICD-10-CM | POA: Diagnosis not present

## 2023-05-11 DIAGNOSIS — R801 Persistent proteinuria, unspecified: Secondary | ICD-10-CM | POA: Diagnosis not present

## 2023-05-11 DIAGNOSIS — R809 Proteinuria, unspecified: Secondary | ICD-10-CM | POA: Diagnosis not present

## 2023-05-11 DIAGNOSIS — E663 Overweight: Secondary | ICD-10-CM | POA: Diagnosis not present

## 2023-05-11 DIAGNOSIS — R600 Localized edema: Secondary | ICD-10-CM | POA: Diagnosis not present

## 2023-06-18 ENCOUNTER — Ambulatory Visit: Payer: BC Managed Care – PPO | Admitting: Family Medicine

## 2023-06-18 ENCOUNTER — Encounter: Payer: Self-pay | Admitting: Family Medicine

## 2023-06-18 VITALS — BP 124/80 | HR 69 | Temp 98.6°F | Ht 70.0 in | Wt 284.0 lb

## 2023-06-18 DIAGNOSIS — E1122 Type 2 diabetes mellitus with diabetic chronic kidney disease: Secondary | ICD-10-CM | POA: Diagnosis not present

## 2023-06-18 DIAGNOSIS — Z7984 Long term (current) use of oral hypoglycemic drugs: Secondary | ICD-10-CM

## 2023-06-18 DIAGNOSIS — K219 Gastro-esophageal reflux disease without esophagitis: Secondary | ICD-10-CM

## 2023-06-18 DIAGNOSIS — R809 Proteinuria, unspecified: Secondary | ICD-10-CM | POA: Diagnosis not present

## 2023-06-18 DIAGNOSIS — E1129 Type 2 diabetes mellitus with other diabetic kidney complication: Secondary | ICD-10-CM | POA: Diagnosis not present

## 2023-06-18 LAB — POCT GLYCOSYLATED HEMOGLOBIN (HGB A1C): Hemoglobin A1C: 6.7 % — AB (ref 4.0–5.6)

## 2023-06-18 MED ORDER — FUROSEMIDE 40 MG PO TABS: 20.0000 mg | ORAL_TABLET | Freq: Every day | ORAL | Status: DC

## 2023-06-18 MED ORDER — PANTOPRAZOLE SODIUM 20 MG PO TBEC: 20.0000 mg | DELAYED_RELEASE_TABLET | Freq: Every day | ORAL | 3 refills | Status: AC

## 2023-06-18 MED ORDER — CITALOPRAM HYDROBROMIDE 10 MG PO TABS: 10.0000 mg | ORAL_TABLET | Freq: Every day | ORAL | 3 refills | Status: DC

## 2023-06-18 NOTE — Progress Notes (Signed)
 Diabetes:  Using medications without difficulties: yes Hypoglycemic episodes:no Hyperglycemic episodes:no Feet problems: no Blood Sugars averaging: ~120 eye exam within last year: yes A1c d/w pt at OV.  Controlled.    GERD controlled on PPI.  Rx sent.  D/w pt. Failed taper/cessation.    He has been taking 20-40mg  lasix .  He has more UOP with higher dose but 20mg  still has some effect.  He has trouble taking 40mg  on workdays due to urinary frequency.  Swelling is better than prior.    Weight up from prior OV, d/w pt about diet and exercise.    Encouraged CPAP use.  Still snoring some.    He has noted some triggering of the B 3rd-5th fingers.  Normal ROM at OV today.    Meds, vitals, and allergies reviewed.   ROS: Per HPI unless specifically indicated in ROS section   GEN: nad, alert and oriented HEENT: ncat NECK: supple w/o LA CV: rrr. PULM: ctab, no inc wob ABD: soft, +bs EXT: trace BLE edema SKIN: no acute rash Normal B hand/finger ROM

## 2023-06-18 NOTE — Patient Instructions (Addendum)
 If you notice more hand cramping with 40mg  furosemide/fluid pill, then cur back to 20mg .  Update me as needed.  Take care.  Glad to see you. Recheck at a yearly visit in about 4-5 months.  Labs ahead of time is possible.

## 2023-06-21 NOTE — Assessment & Plan Note (Signed)
 GERD controlled on PPI.  Rx sent.  D/w pt. Failed taper/cessation.  Continue as is.

## 2023-06-21 NOTE — Assessment & Plan Note (Signed)
 If noting more hand cramping with 40mg  furosemide/fluid pill, then cut back to 20mg .  Update me as needed.

## 2023-06-21 NOTE — Assessment & Plan Note (Signed)
 A1c d/w pt at OV.  Controlled.  Continue work on diet and exercise. Continue farxiga and metformin. Recheck periodically.  Recheck at a yearly visit in about 4-5 months.  Labs ahead of time is possible.

## 2023-06-22 ENCOUNTER — Ambulatory Visit: Payer: BC Managed Care – PPO | Admitting: Internal Medicine

## 2023-07-25 ENCOUNTER — Other Ambulatory Visit: Payer: Self-pay | Admitting: Family Medicine

## 2023-09-30 IMAGING — DX DG CHEST 2V
2 series · 2 of 2 positions shown · non-contrast
Comparison: Multiple chest x-rays since July 23, 2016

CLINICAL DATA: Question pulmonary edema. New onset severe lower
extremity edema. Bilateral leg swelling for 2 weeks.

EXAM:
CHEST - 2 VIEW

[chest pa]
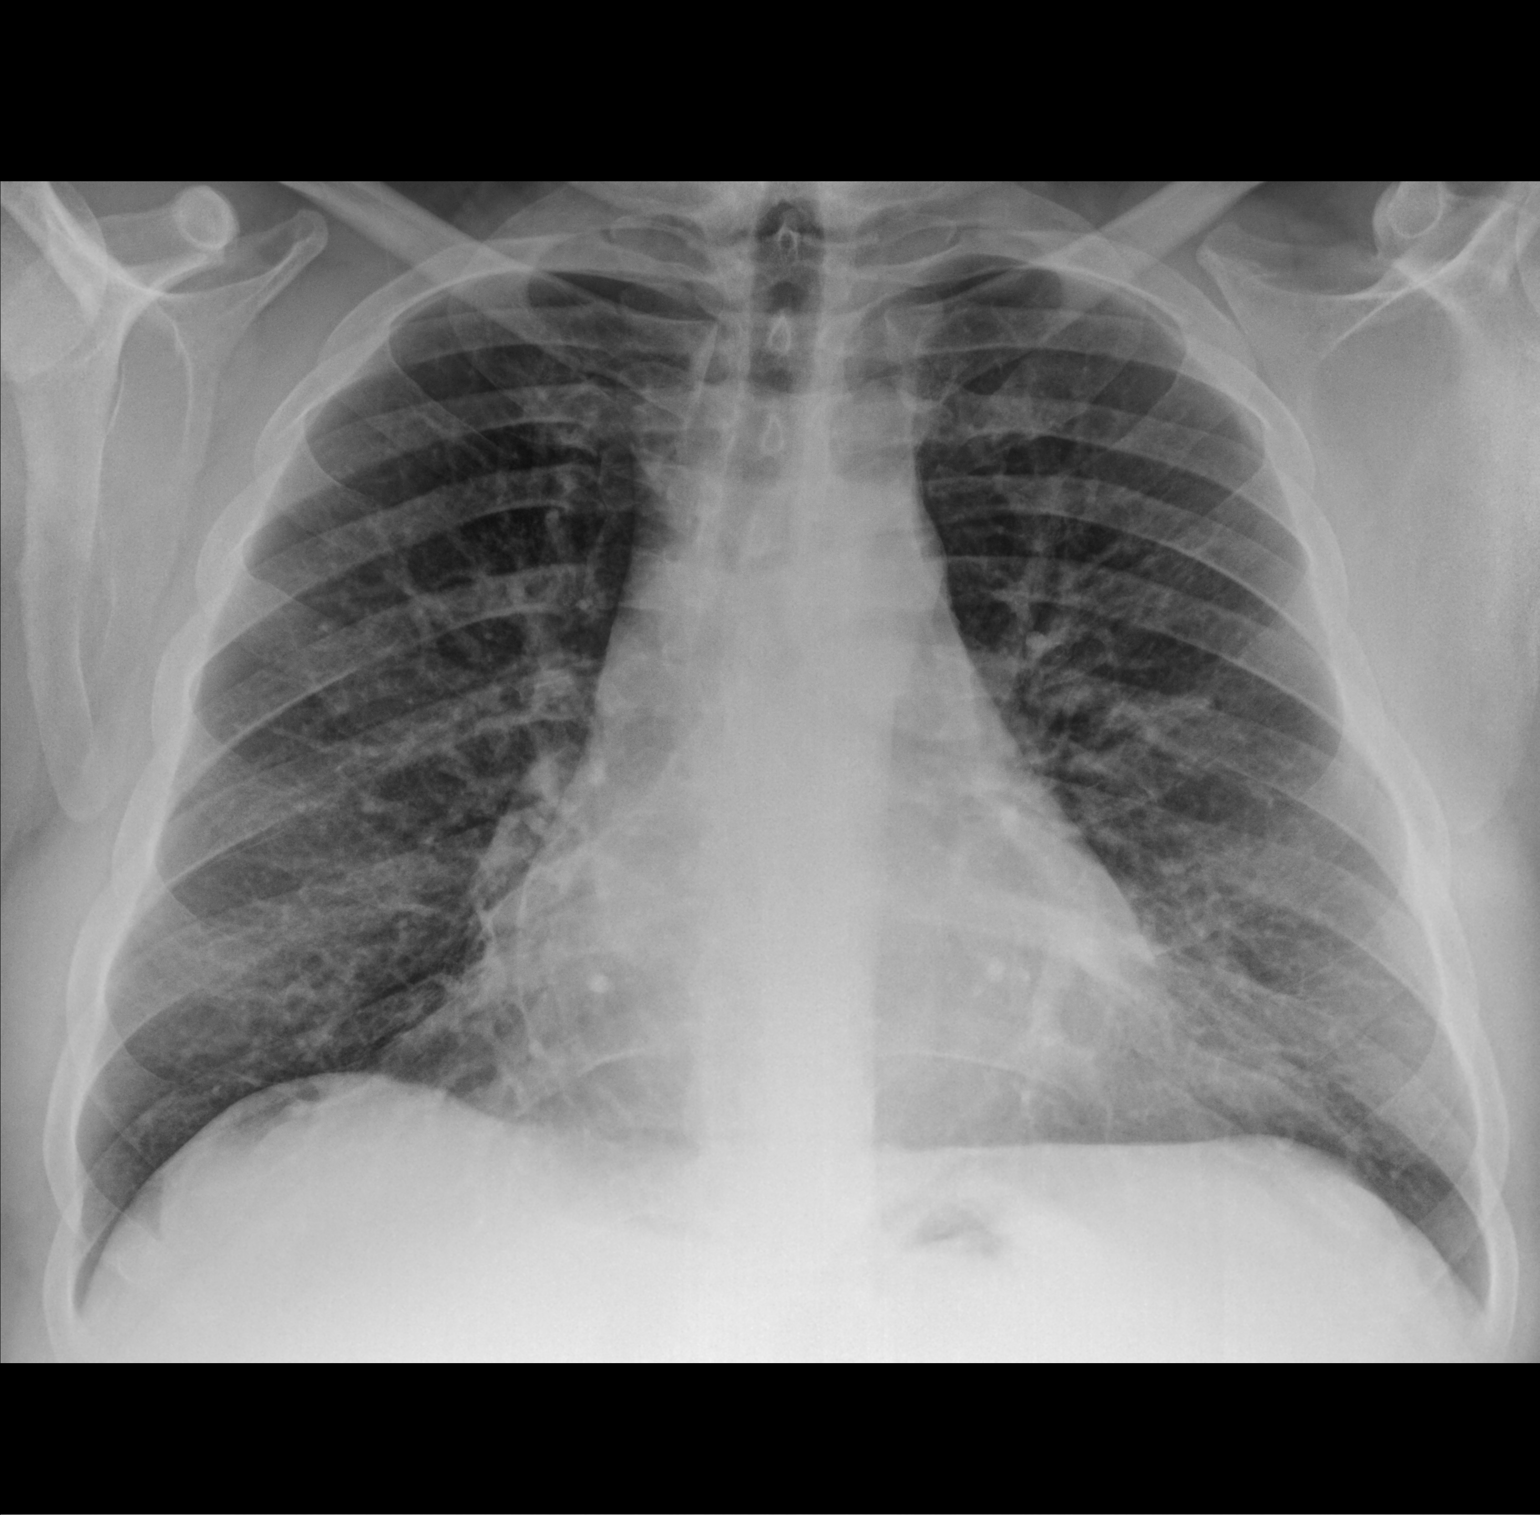

[chest lat]
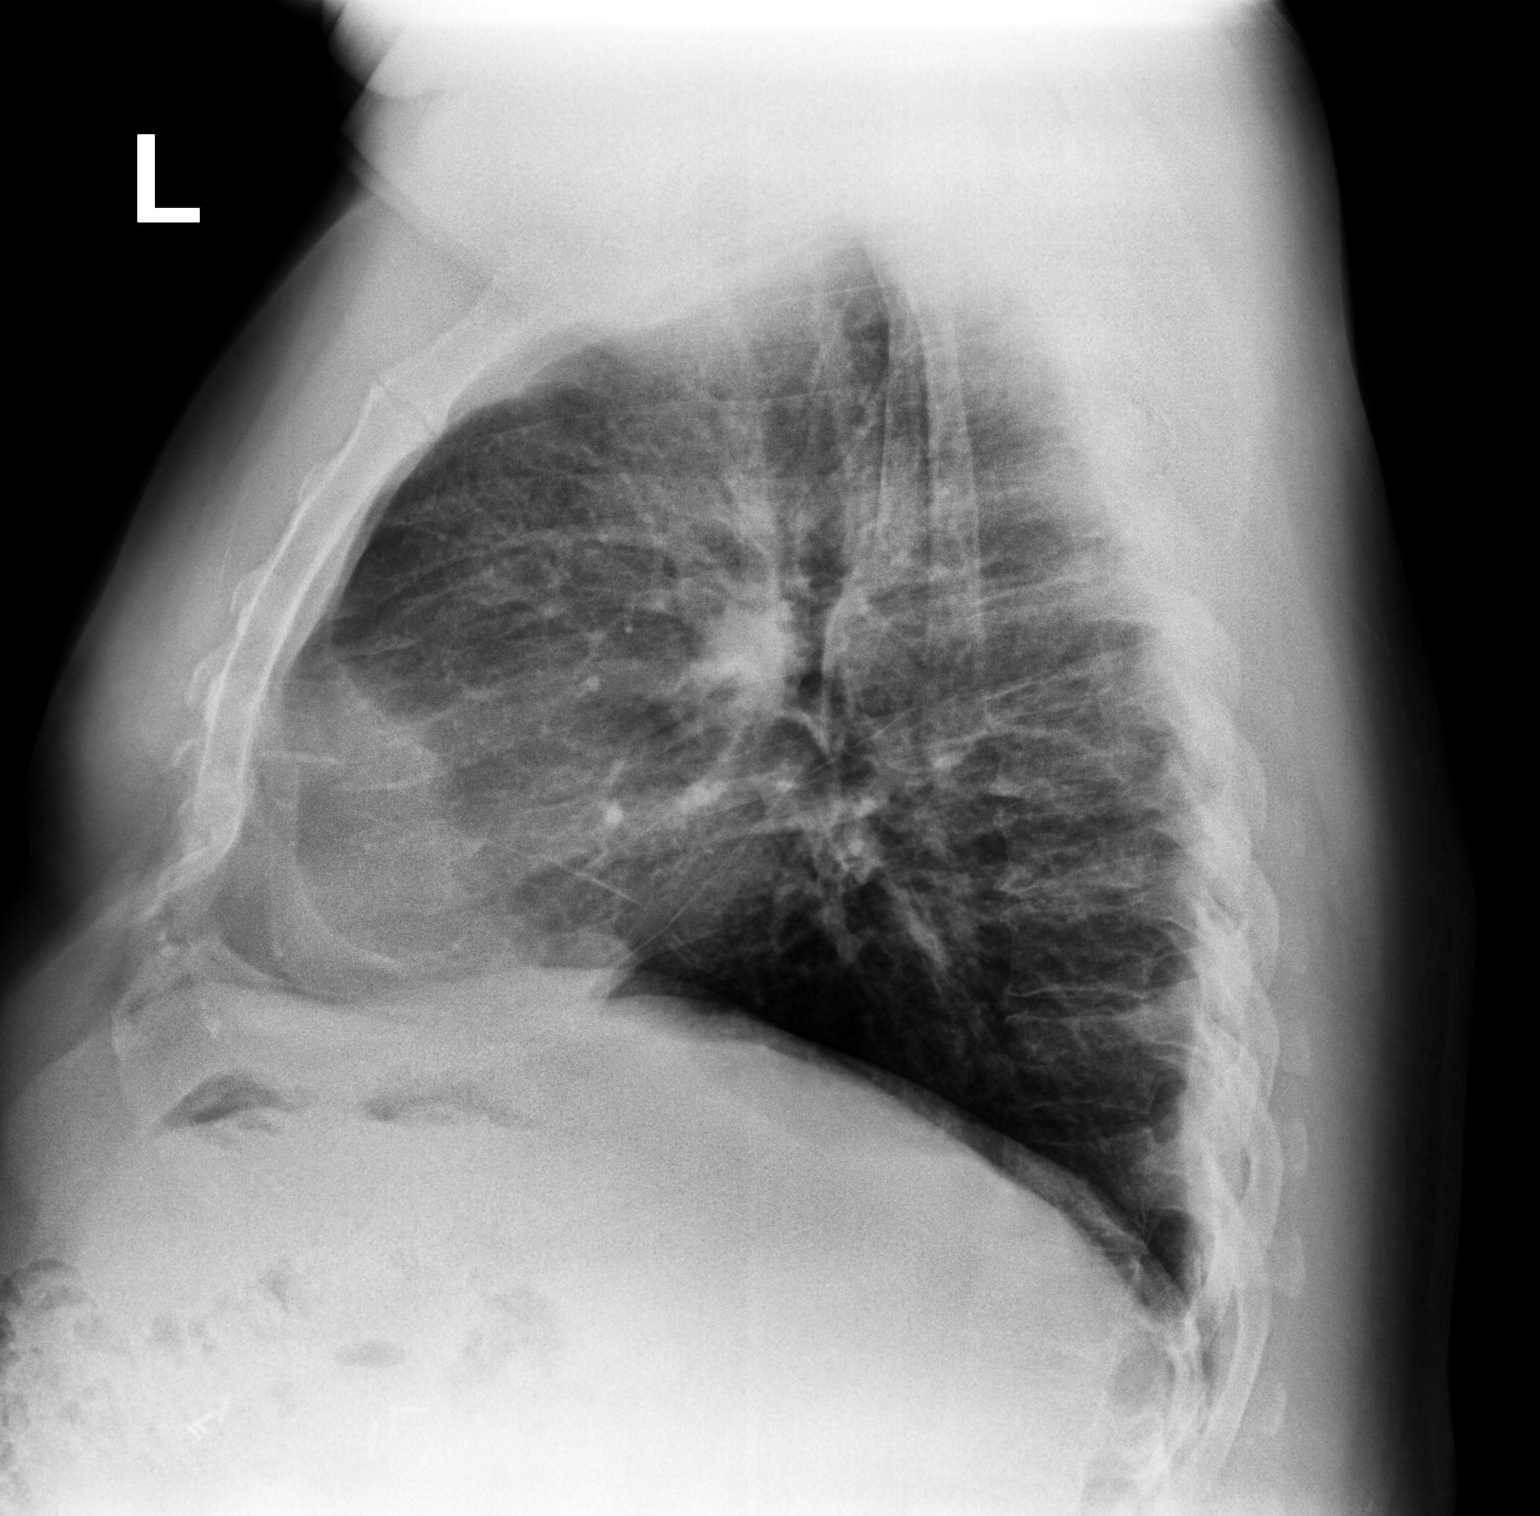

[2 of 2 positions shown; findings below may reference images not displayed]

FINDINGS: Mild haziness in the lingula is stable since July 23, 2016,
likely scarring or atelectasis. Stable cardiomegaly. The hila and
mediastinum are unremarkable. No pneumothorax. No overt edema. No
acute infiltrate.
IMPRESSION: No pulmonary edema identified on today's study. No acute
abnormalities or changes.

## 2023-10-09 ENCOUNTER — Encounter: Payer: Self-pay | Admitting: Family Medicine

## 2023-10-09 ENCOUNTER — Other Ambulatory Visit: Payer: Self-pay | Admitting: Family Medicine

## 2023-10-11 NOTE — Telephone Encounter (Signed)
 Prescription sent for gabapentin .  Please check with patient about glimepiride .  I did not think he was still taking that medication.  Thanks.

## 2023-10-13 NOTE — Progress Notes (Unsigned)
     Kyle Kozel T. Ramiya Delahunty, MD, CAQ Sports Medicine [facility] [address] [place], [address]  Phone: [phone]  FAX: [phone]  Kyle Murphy - 48 y.o. male  MRN [id number]  Date of Birth: 10-12-1975  Date: 10/14/2023  PCP: Donnie Galea, MD  Referral: Donnie Galea, MD  No chief complaint on file.  Subjective:   Kyle Murphy is a 48 y.o. very pleasant male patient with There is no height or weight on file to calculate BMI. who presents with the following:  Patient is here for evaluation.  He has a sensation that his hands are locking up.  On chart review, looks as if he has had 3 different digits on both hands that are locking up for little bit over a month.    Review of Systems is noted in the HPI, as appropriate  Objective:   There were no vitals taken for this visit.  GEN: No acute distress; alert,appropriate. PULM: Breathing comfortably in no respiratory distress PSYCH: Normally interactive.   Laboratory and Imaging Data:  Assessment and Plan:   ***

## 2023-10-13 NOTE — Telephone Encounter (Signed)
 Patient states that he is no longer taken the glimepride

## 2023-10-14 ENCOUNTER — Encounter: Payer: Self-pay | Admitting: Family Medicine

## 2023-10-14 ENCOUNTER — Ambulatory Visit: Admitting: Family Medicine

## 2023-10-14 VITALS — BP 130/78 | HR 77 | Temp 97.8°F | Ht 70.0 in | Wt 287.5 lb

## 2023-10-14 DIAGNOSIS — M2550 Pain in unspecified joint: Secondary | ICD-10-CM

## 2023-10-14 DIAGNOSIS — M7989 Other specified soft tissue disorders: Secondary | ICD-10-CM

## 2023-10-14 LAB — SEDIMENTATION RATE: Sed Rate: 26 mm/h — ABNORMAL HIGH (ref 0–15)

## 2023-10-14 LAB — HIGH SENSITIVITY CRP: CRP, High Sensitivity: 1.9 mg/L (ref 0.000–5.000)

## 2023-10-14 MED ORDER — PREDNISONE 20 MG PO TABS: ORAL_TABLET | ORAL | 0 refills | Status: DC

## 2023-10-18 LAB — ANA SCREEN,IFA,REFLEX TITER/PATTERN,REFLEX MPLX 11 AB CASCADE
Anti Nuclear Antibody (ANA): NEGATIVE
Cyclic Citrullin Peptide Ab: <16 U
MUTATED CITRULLINATED VIMENTIN (MCV) AB: <20 U/mL (ref <20)
Rheumatoid fact SerPl-aCnc: 10 [IU]/mL (ref <14)

## 2023-10-20 ENCOUNTER — Ambulatory Visit: Payer: Self-pay | Admitting: Family Medicine

## 2023-10-22 LAB — HM DIABETES EYE EXAM

## 2023-11-04 ENCOUNTER — Other Ambulatory Visit: Payer: Self-pay | Admitting: Family Medicine

## 2023-11-04 DIAGNOSIS — E1129 Type 2 diabetes mellitus with other diabetic kidney complication: Secondary | ICD-10-CM

## 2023-11-05 DIAGNOSIS — R801 Persistent proteinuria, unspecified: Secondary | ICD-10-CM | POA: Diagnosis not present

## 2023-11-05 DIAGNOSIS — R600 Localized edema: Secondary | ICD-10-CM | POA: Diagnosis not present

## 2023-11-05 DIAGNOSIS — E1129 Type 2 diabetes mellitus with other diabetic kidney complication: Secondary | ICD-10-CM | POA: Diagnosis not present

## 2023-11-05 DIAGNOSIS — R809 Proteinuria, unspecified: Secondary | ICD-10-CM | POA: Diagnosis not present

## 2023-11-09 ENCOUNTER — Other Ambulatory Visit (INDEPENDENT_AMBULATORY_CARE_PROVIDER_SITE_OTHER)

## 2023-11-09 ENCOUNTER — Other Ambulatory Visit: Payer: BC Managed Care – PPO

## 2023-11-09 DIAGNOSIS — R600 Localized edema: Secondary | ICD-10-CM | POA: Diagnosis not present

## 2023-11-09 DIAGNOSIS — E1129 Type 2 diabetes mellitus with other diabetic kidney complication: Secondary | ICD-10-CM

## 2023-11-09 DIAGNOSIS — I5030 Unspecified diastolic (congestive) heart failure: Secondary | ICD-10-CM | POA: Diagnosis not present

## 2023-11-09 DIAGNOSIS — R809 Proteinuria, unspecified: Secondary | ICD-10-CM | POA: Diagnosis not present

## 2023-11-09 DIAGNOSIS — E790 Hyperuricemia without signs of inflammatory arthritis and tophaceous disease: Secondary | ICD-10-CM | POA: Diagnosis not present

## 2023-11-09 LAB — CBC WITH DIFFERENTIAL/PLATELET
Basophils Absolute: 0.1 10*3/uL (ref 0.0–0.1)
Basophils Relative: 0.7 % (ref 0.0–3.0)
Eosinophils Absolute: 0.2 10*3/uL (ref 0.0–0.7)
Eosinophils Relative: 2.1 % (ref 0.0–5.0)
HCT: 44.8 % (ref 39.0–52.0)
Hemoglobin: 15.2 g/dL (ref 13.0–17.0)
Lymphocytes Relative: 26.3 % (ref 12.0–46.0)
Lymphs Abs: 2.6 10*3/uL (ref 0.7–4.0)
MCHC: 34 g/dL (ref 30.0–36.0)
MCV: 90.8 fl (ref 78.0–100.0)
Monocytes Absolute: 0.7 10*3/uL (ref 0.1–1.0)
Monocytes Relative: 7.3 % (ref 3.0–12.0)
Neutro Abs: 6.2 10*3/uL (ref 1.4–7.7)
Neutrophils Relative %: 63.6 % (ref 43.0–77.0)
Platelets: 269 10*3/uL (ref 150.0–400.0)
RBC: 4.93 Mil/uL (ref 4.22–5.81)
RDW: 13.7 % (ref 11.5–15.5)
WBC: 9.7 10*3/uL (ref 4.0–10.5)

## 2023-11-09 LAB — MICROALBUMIN / CREATININE URINE RATIO
Creatinine,U: 103.9 mg/dL
Microalb Creat Ratio: 140.8 mg/g — ABNORMAL HIGH (ref 0.0–30.0)
Microalb, Ur: 14.6 mg/dL — ABNORMAL HIGH (ref 0.0–1.9)

## 2023-11-09 LAB — COMPREHENSIVE METABOLIC PANEL WITH GFR
ALT: 34 U/L (ref 0–53)
AST: 19 U/L (ref 0–37)
Albumin: 3.9 g/dL (ref 3.5–5.2)
Alkaline Phosphatase: 45 U/L (ref 39–117)
BUN: 10 mg/dL (ref 6–23)
CO2: 27 meq/L (ref 19–32)
Calcium: 8.8 mg/dL (ref 8.4–10.5)
Chloride: 103 meq/L (ref 96–112)
Creatinine, Ser: 0.68 mg/dL (ref 0.40–1.50)
GFR: 109.94 mL/min (ref >60.00)
Glucose, Bld: 134 mg/dL — ABNORMAL HIGH (ref 70–99)
Potassium: 4 meq/L (ref 3.5–5.1)
Sodium: 140 meq/L (ref 135–145)
Total Bilirubin: 0.5 mg/dL (ref 0.2–1.2)
Total Protein: 6.4 g/dL (ref 6.0–8.3)

## 2023-11-09 LAB — HEMOGLOBIN A1C: Hgb A1c MFr Bld: 8.7 % — ABNORMAL HIGH (ref 4.6–6.5)

## 2023-11-09 LAB — LIPID PANEL
Cholesterol: 178 mg/dL (ref 0–200)
HDL: 31.3 mg/dL — ABNORMAL LOW (ref >39.00)
LDL Cholesterol: 104 mg/dL — ABNORMAL HIGH (ref 0–99)
NonHDL: 146.55
Total CHOL/HDL Ratio: 6
Triglycerides: 214 mg/dL — ABNORMAL HIGH (ref 0.0–149.0)
VLDL: 42.8 mg/dL — ABNORMAL HIGH (ref 0.0–40.0)

## 2023-11-09 LAB — URIC ACID: Uric Acid, Serum: 6.6 mg/dL (ref 4.0–7.8)

## 2023-11-11 ENCOUNTER — Ambulatory Visit: Payer: Self-pay | Admitting: Family Medicine

## 2023-11-16 ENCOUNTER — Ambulatory Visit (INDEPENDENT_AMBULATORY_CARE_PROVIDER_SITE_OTHER): Payer: BC Managed Care – PPO | Admitting: Family Medicine

## 2023-11-16 ENCOUNTER — Encounter: Payer: Self-pay | Admitting: Family Medicine

## 2023-11-16 VITALS — BP 132/78 | HR 75 | Temp 98.9°F | Ht 68.86 in | Wt 289.4 lb

## 2023-11-16 DIAGNOSIS — Z7189 Other specified counseling: Secondary | ICD-10-CM

## 2023-11-16 DIAGNOSIS — R252 Cramp and spasm: Secondary | ICD-10-CM

## 2023-11-16 DIAGNOSIS — R809 Proteinuria, unspecified: Secondary | ICD-10-CM

## 2023-11-16 DIAGNOSIS — F419 Anxiety disorder, unspecified: Secondary | ICD-10-CM

## 2023-11-16 DIAGNOSIS — Z789 Other specified health status: Secondary | ICD-10-CM

## 2023-11-16 DIAGNOSIS — Z0000 Encounter for general adult medical examination without abnormal findings: Secondary | ICD-10-CM

## 2023-11-16 DIAGNOSIS — G4730 Sleep apnea, unspecified: Secondary | ICD-10-CM

## 2023-11-16 DIAGNOSIS — E1129 Type 2 diabetes mellitus with other diabetic kidney complication: Secondary | ICD-10-CM

## 2023-11-16 DIAGNOSIS — E1122 Type 2 diabetes mellitus with diabetic chronic kidney disease: Secondary | ICD-10-CM

## 2023-11-16 MED ORDER — FUROSEMIDE 40 MG PO TABS: 40.0000 mg | ORAL_TABLET | Freq: Every day | ORAL | 3 refills | Status: AC

## 2023-11-16 MED ORDER — CITALOPRAM HYDROBROMIDE 10 MG PO TABS: 10.0000 mg | ORAL_TABLET | Freq: Every day | ORAL | 3 refills | Status: DC

## 2023-11-16 MED ORDER — GABAPENTIN 300 MG PO CAPS: 300.0000 mg | ORAL_CAPSULE | Freq: Every evening | ORAL | 3 refills | Status: AC | PRN

## 2023-11-16 MED ORDER — FUROSEMIDE 40 MG PO TABS: 40.0000 mg | ORAL_TABLET | Freq: Every day | ORAL | Status: DC

## 2023-11-16 MED ORDER — METFORMIN HCL 500 MG PO TABS: 1000.0000 mg | ORAL_TABLET | Freq: Two times a day (BID) | ORAL | Status: AC

## 2023-11-16 MED ORDER — GABAPENTIN 300 MG PO CAPS: 300.0000 mg | ORAL_CAPSULE | Freq: Every evening | ORAL | Status: DC | PRN

## 2023-11-16 NOTE — Patient Instructions (Addendum)
 Recheck A1c at a visit in about 3 months.  You don't need to fast.  Take care.  Glad to see you. If your sugar isn't improving then let me know.   If you have more hand cramping, then check your sugar.

## 2023-11-16 NOTE — Progress Notes (Unsigned)
 CPE- See plan.  Routine anticipatory guidance given to patient.  See health maintenance.  The possibility exists that previously documented standard health maintenance information may have been brought forward from a previous encounter into this note.  If needed, that same information has been updated to reflect the current situation based on today's encounter.    Tetanus 2018 Flu 2024 PNA 2019 shingles not due covid vaccine 2021 Colonoscopy 2023 PSA not due.   Living will d/w pt.  Mother designated if patient were incapacitated.  Diet and exercise d/w pt.    D/w pt about restarting CPAP, compliance.  Diabetes:  Using medications without difficulties: yes Hypoglycemic episodes: no Hyperglycemic episodes:no Feet problems: no Blood Sugars averaging: usually ~170s eye exam within last year: yes A1c elevated, d/w pt about diet and also recent use of prednisone .   Statin intolerant.   He had renal eval, d/w pt.    He had mult fingers locking, 3rd-5th fingers B.  Sometimes each hand concurrently. He had less events on prednisone .  He had overall unremarkable rheum labs, d/w pt.    Anxiety.  Still on citalopram , it helped.  No SI/HI.  Compliant.    Working 2nd shift.    PMH and SH reviewed  Meds, vitals, and allergies reviewed.   ROS: Per HPI.  Unless specifically indicated otherwise in HPI, the patient denies:  General: fever. Eyes: acute vision changes ENT: sore throat Cardiovascular: chest pain Respiratory: SOB GI: vomiting GU: dysuria Musculoskeletal: acute back pain Derm: acute rash Neuro: acute motor dysfunction Psych: worsening mood Endocrine: polydipsia Heme: bleeding Allergy: hayfever  GEN: nad, alert and oriented HEENT: ncat NECK: supple w/o LA CV: rrr. PULM: ctab, no inc wob ABD: soft, +bs EXT: no edema SKIN: no acute rash  Diabetic foot exam: Normal inspection No skin breakdown No calluses  Normal DP pulses Normal sensation to light touch and  monofilament Nails normal

## 2023-11-17 DIAGNOSIS — S6991XA Unspecified injury of right wrist, hand and finger(s), initial encounter: Secondary | ICD-10-CM | POA: Diagnosis not present

## 2023-11-18 DIAGNOSIS — R252 Cramp and spasm: Secondary | ICD-10-CM | POA: Insufficient documentation

## 2023-11-18 NOTE — Assessment & Plan Note (Signed)
 Continue citalopram .  Update me as needed.  Okay for outpatient follow-up.

## 2023-11-18 NOTE — Assessment & Plan Note (Signed)
 Unclear if this is related to relative dehydration/relative hypoglycemia.  If he has recurrent symptoms and I asked him to check his sugar at that time.

## 2023-11-18 NOTE — Assessment & Plan Note (Signed)
Living will d/w pt. Mother designated if patient were incapacitated.  

## 2023-11-18 NOTE — Assessment & Plan Note (Addendum)
 Discussed with patient about options. Recheck A1c at a visit in about 3 months.  Discussed diet. If his sugar isn't improving then let me know.   If he has more hand cramping, then I asked him to check his sugar at the time.  Unclear if hyperglycemia contributes to previous symptoms. Continue Farxiga metformin .

## 2023-11-18 NOTE — Assessment & Plan Note (Signed)
 Statin intolerant

## 2023-11-18 NOTE — Assessment & Plan Note (Signed)
 D/w pt about restarting CPAP, compliance.

## 2023-11-18 NOTE — Assessment & Plan Note (Signed)
 He still renal clinic.  Continue Farxiga.

## 2023-11-18 NOTE — Assessment & Plan Note (Signed)
 Tetanus 2018 Flu 2024 PNA 2019 shingles not due covid vaccine 2021 Colonoscopy 2023 PSA not due.   Living will d/w pt.  Mother designated if patient were incapacitated.  Diet and exercise d/w pt.

## 2024-02-16 ENCOUNTER — Ambulatory Visit: Admitting: Family Medicine

## 2024-02-16 ENCOUNTER — Encounter: Payer: Self-pay | Admitting: Family Medicine

## 2024-02-16 VITALS — BP 128/76 | HR 70 | Temp 98.1°F | Ht 68.86 in | Wt 280.6 lb

## 2024-02-16 DIAGNOSIS — Z23 Encounter for immunization: Secondary | ICD-10-CM | POA: Diagnosis not present

## 2024-02-16 DIAGNOSIS — E1129 Type 2 diabetes mellitus with other diabetic kidney complication: Secondary | ICD-10-CM

## 2024-02-16 DIAGNOSIS — R809 Proteinuria, unspecified: Secondary | ICD-10-CM | POA: Diagnosis not present

## 2024-02-16 LAB — POCT GLYCOSYLATED HEMOGLOBIN (HGB A1C): Hemoglobin A1C: 7.8 % — AB (ref 4.0–5.6)

## 2024-02-16 NOTE — Patient Instructions (Signed)
 Thanks for your effort.  Recheck in about 3 months.  A1c at the visit.  Take care.  Glad to see you.  Update me as needed.

## 2024-02-16 NOTE — Progress Notes (Unsigned)
 Diabetes:  Using medications without difficulties: yes Hypoglycemic episodes: rare, with prolonged fasting, cautions d/w pt.  Hyperglycemic episodes:no Feet problems: no Blood Sugars averaging: not checked often eye exam within last year: yes A1c 7.8.  Improved.  Intentional weight loss with diet. Prev statin intolerant.    Flu shot today.    His dad was recently admitted to the hospital after a fall, d/w pt.    BLE edema improved from prior.    Meds, vitals, and allergies reviewed.   ROS: Per HPI unless specifically indicated in ROS section   GEN: nad, alert and oriented HEENT: mucous membranes moist NECK: supple w/o LA CV: rrr. PULM: ctab, no inc wob ABD: soft, +bs EXT: no edema SKIN: well perfused.

## 2024-02-17 NOTE — Assessment & Plan Note (Signed)
 A1c 7.8.  Improved.  Intentional weight loss with diet. Prev statin intolerant.    Flu shot today.    No change in medication.  His A1c may continue to improve with continued weight loss. Recheck in about 3 months.  A1c at the visit.

## 2024-03-23 ENCOUNTER — Emergency Department
Admission: EM | Admit: 2024-03-23 | Discharge: 2024-03-23 | Disposition: A | Discharge status: Home / Self Care | Attending: Emergency Medicine | Admitting: Emergency Medicine

## 2024-03-23 ENCOUNTER — Other Ambulatory Visit: Payer: Self-pay

## 2024-03-23 DIAGNOSIS — E1122 Type 2 diabetes mellitus with diabetic chronic kidney disease: Secondary | ICD-10-CM | POA: Diagnosis not present

## 2024-03-23 DIAGNOSIS — S39012A Strain of muscle, fascia and tendon of lower back, initial encounter: Secondary | ICD-10-CM | POA: Diagnosis not present

## 2024-03-23 DIAGNOSIS — X500XXA Overexertion from strenuous movement or load, initial encounter: Secondary | ICD-10-CM | POA: Diagnosis not present

## 2024-03-23 DIAGNOSIS — N189 Chronic kidney disease, unspecified: Secondary | ICD-10-CM | POA: Diagnosis not present

## 2024-03-23 DIAGNOSIS — M545 Low back pain, unspecified: Secondary | ICD-10-CM | POA: Diagnosis not present

## 2024-03-23 MED ORDER — LIDOCAINE 5 % EX PTCH
1.0000 | MEDICATED_PATCH | CUTANEOUS | Status: DC
  Administered 2024-03-23: 1 via TRANSDERMAL
  Filled 2024-03-23: qty 1

## 2024-03-23 MED ORDER — NAPROXEN 500 MG PO TBEC: 500.0000 mg | DELAYED_RELEASE_TABLET | Freq: Two times a day (BID) | ORAL | 0 refills | Status: DC

## 2024-03-23 MED ORDER — PREDNISONE 20 MG PO TABS
60.0000 mg | ORAL_TABLET | Freq: Once | ORAL | Status: AC
  Administered 2024-03-23: 60 mg via ORAL
  Filled 2024-03-23: qty 3

## 2024-03-23 MED ORDER — LIDOCAINE 5 % EX PTCH: 1.0000 | MEDICATED_PATCH | Freq: Two times a day (BID) | CUTANEOUS | 0 refills | Status: AC

## 2024-03-23 MED ORDER — CYCLOBENZAPRINE HCL 10 MG PO TABS: 10.0000 mg | ORAL_TABLET | Freq: Three times a day (TID) | ORAL | 0 refills | Status: AC | PRN

## 2024-03-23 MED ORDER — KETOROLAC TROMETHAMINE 30 MG/ML IJ SOLN
30.0000 mg | Freq: Once | INTRAMUSCULAR | Status: AC
  Administered 2024-03-23: 30 mg via INTRAMUSCULAR
  Filled 2024-03-23: qty 1

## 2024-03-23 NOTE — Discharge Instructions (Signed)
 Have been diagnosed with lumbar strain, please take Flexeril  1 tablet by mouth every 8 hours after main meals.  Avoid driving while taking Flexeril .  If you plan to drive please only take Flexeril  at bedtime.  Please take naproxen 1 tablet by mouth every 12 hours with plenty of fluids.  You can apply lidocaine  patch and the skin every 12 hours in the lumbar area.  Please come back to ED or go to your PCP if you have new symptoms or symptoms worsen.

## 2024-03-23 NOTE — ED Triage Notes (Signed)
 PT arrives via POV. PT c/o lower back pain for the past two days after moving furniture. Cms intact. No loss of control of bowel or bladder. Pt AxOx4.

## 2024-03-23 NOTE — ED Provider Notes (Signed)
 [facility] Provider Note    Event Date/Time   First MD Initiated Contact with Patient 03/23/24 1925     (approximate)   History   Back Pain    HPI  Kyle Murphy is a 48 y.o. male    with a past medical history of diabetes type 2, right hand injury, polyarthralgia, left cervical radiculopathy, microalbuminuria, OSA, hyperlipidemia,  who presents to the ED complaining of back pain. According to the patient, last Monday he picked a heavy couch with subsequent lumbar pain.  Patient endorses having lumbar L4-L5 history of lumbar pain.  Patient is able to walk but it is difficult for him to get up from bed.  Patient is here by himself.     Patient Active Problem List   Diagnosis Date Noted   Hand cramp 11/18/2023   Chronic kidney disease due to type 2 diabetes mellitus (HCC) 11/04/2022   Hyperuricemia 11/04/2022   Severe sleep apnea 12/25/2021   Lower extremity edema 12/25/2021   Proteinuria 11/15/2021   Colon cancer screening    Rectal polyp    Statin intolerance 09/11/2021   Loud snoring 09/11/2021   Metatarsalgia 05/04/2019   Rash 01/30/2019   Sciatica of right side 11/07/2018   Lower urinary tract symptoms (LUTS) 07/30/2018   Allergy 07/30/2018   Lip swelling 04/20/2018   Skin lesion 01/27/2018   Hematuria 07/19/2017   Fatty liver 07/19/2017   Anxiety 07/26/2016   Diabetes mellitus with renal manifestation (HCC) 03/30/2016   Arm paresthesia, left 03/30/2016   Advance care planning 08/09/2014   GERD (gastroesophageal reflux disease) 08/09/2014   Cough 11/28/2013   Plantar fasciitis 09/23/2013   Routine general medical examination at a health care facility 04/01/2011   Back pain 04/01/2011   Hyperlipidemia 03/28/2010   Multinodular goiter 03/28/2010     ROS: Patient currently denies any vision changes, tinnitus, difficulty speaking, facial droop, sore throat, chest pain, shortness of breath, abdominal pain,  nausea/vomiting/diarrhea, dysuria, or weakness/numbness/paresthesias in any extremity   Physical Exam   Triage Vital Signs: ED Triage Vitals  Encounter Vitals Group     BP 03/23/24 1720 (!) 137/96     Girls Systolic BP Percentile --      Girls Diastolic BP Percentile --      Boys Systolic BP Percentile --      Boys Diastolic BP Percentile --      Pulse Rate 03/23/24 1720 74     Resp 03/23/24 1720 18     Temp 03/23/24 1720 98.4 F (36.9 C)     Temp Source 03/23/24 1720 Oral     SpO2 03/23/24 1720 97 %     Weight 03/23/24 1721 279 lb (126.6 kg)     Height 03/23/24 1721 5' 10 (1.778 m)     Head Circumference --      Peak Flow --      Pain Score 03/23/24 1723 7     Pain Loc --      Pain Education --      Exclude from Growth Chart --     Most recent vital signs: Vitals:   03/23/24 1720  BP: (!) 137/96  Pulse: 74  Resp: 18  Temp: 98.4 F (36.9 C)  SpO2: 97%     Physical Exam Vitals and nursing note reviewed.  During triage patient was hypertensive  General:          Awake, no distress.  CV:  Good peripheral perfusion.  Resp:               Normal effort. no tachypnea Abd:                 No distention.  Soft nontender Other:              Lumbar spine: Skin is intact, no ecchymosis no hematomas.  Tenderness to palpation at level of L4-L5.  Negative bilateral SLR ED Results / Procedures / Treatments   Labs (all labs ordered are listed, but only abnormal results are displayed) Labs Reviewed - No data to display        PROCEDURES:  Critical Care performed:   Procedures   MEDICATIONS ORDERED IN ED: Medications  ketorolac  (TORADOL ) 30 MG/ML injection 30 mg (has no administration in time range)  predniSONE  (DELTASONE ) tablet 60 mg (has no administration in time range)  lidocaine  (LIDODERM ) 5 % 1 patch (has no administration in time range)      IMPRESSION / MDM / ASSESSMENT AND PLAN / ED COURSE  I reviewed the triage vital signs and the  nursing notes.  Differential diagnosis includes, but is not limited to, lumbar strain, sciatic nerve compression, unlikely fracture  Patient's presentation is most consistent with acute, uncomplicated illness.   Kyle Murphy is a 48 y.o., male who presents today with history of lumbar pain since Monday after picking up heavy furniture.  Physical exam lumbar spine skin is intact, no ecchymosis no hematomas.  Tenderness to palpation at level of L4/L5.  No signs of cauda equina, sensation is intact, negative bilateral SLR.  Pulses positive. Plan Toradol  Prednisone  Discharge Patient's diagnosis is consistent with lumbar strain. I did not order any imaging or labs I did review the patient's allergies and medications.The patient is in stable and satisfactory condition for discharge home  Patient will be discharged home with prescriptions for Flexeril , naproxen, lidocaine  patch. Patient is to follow up with PCP as needed or otherwise directed. Patient is given ED precautions to return to the ED for any worsening or new symptoms.  Work note was provided Discussed plan of care with patient, answered all of patient's questions, Patient agreeable to plan of care. Advised patient to take medications according to the instructions on the label. Discussed possible side effects of new medications. Patient verbalized understanding.  FINAL CLINICAL IMPRESSION(S) / ED DIAGNOSES   Final diagnoses:  Strain of lumbar region, initial encounter     Rx / DC Orders   ED Discharge Orders          Ordered    lidocaine  (LIDODERM ) 5 %  Every 12 hours        03/23/24 2021    cyclobenzaprine  (FLEXERIL ) 10 MG tablet  3 times daily PRN        03/23/24 2021    naproxen (EC NAPROSYN) 500 MG EC tablet  2 times daily with meals        03/23/24 2021             Note:  This document was prepared using Dragon voice recognition software and may include unintentional dictation errors.   Janit Kast,  PA-C 03/23/24 2022    Dorothyann Drivers, MD 03/23/24 2242

## 2024-03-29 ENCOUNTER — Ambulatory Visit: Payer: Self-pay

## 2024-03-29 NOTE — Telephone Encounter (Signed)
 FYI Only or Action Required?: Action required by provider: request for appointment, clinical question for provider, and update on patient condition.  Patient was last seen in primary care on 02/16/2024 by Cleatus Arlyss RAMAN, MD.  Called Nurse Triage reporting Back Pain.  Symptoms began a week ago.  Interventions attempted: OTC medications: ibuprofen and Prescription medications: Toradol .  Symptoms are: unchanged.  Triage Disposition: See HCP Within 4 Hours (Or PCP Triage)  Patient/caregiver understands and will follow disposition?: Yes  Copied from CRM #[id number]. Topic: Clinical - Red Word Triage >> Mar 29, 2024  9:46 AM Mia F wrote: Red Word that prompted transfer to Nurse Triage: Back pain started about a week ago. Went to ER and was given a muscle relaxer and it has not helped. Pt says he can barely walk. He says when he is sitting he is fine but when he gets up he has a lot of pain. Pt mentions he cannot get out of his bed. He says when he went to the ER he did not get any xrays or anything images. Reason for Disposition  [1] SEVERE back pain (e.g., excruciating, unable to do any normal activities) AND [2] not improved 2 hours after pain medicine  Answer Assessment - Initial Assessment Questions 1. ONSET: When did the pain begin? (e.g., minutes, hours, days)     A week ago  2. LOCATION: Where does it hurt? (upper, mid or lower back)     Lower back  3. SEVERITY: How bad is the pain?  (e.g., Scale 1-10; mild, moderate, or severe)     Moderate to Severe  4. PATTERN: Is the pain constant? (e.g., yes, no; constant, intermittent)      Constant  5. RADIATION: Does the pain shoot into your legs or somewhere else?     Denies  6. CAUSE:  What do you think is causing the back pain?      Unsure  7. BACK OVERUSE:  Any recent lifting of heavy objects, strenuous work or exercise?     Moved a couch two weeks ago  8. MEDICINES: What have you taken so far for the pain? (e.g.,  nothing, acetaminophen , NSAIDS)     Ibuprofen, Recent ER Visit  9. NEUROLOGIC SYMPTOMS: Do you have any weakness, numbness, or problems with bowel/bladder control?     None  10. OTHER SYMPTOMS: Do you have any other symptoms? (e.g., fever, abdomen pain, burning with urination, blood in urine)       None  Protocols used: Back Pain-A-AH

## 2024-03-29 NOTE — Telephone Encounter (Signed)
 Will see at OV.  Did he get/try lidocaine  patch?

## 2024-03-29 NOTE — Telephone Encounter (Signed)
 Lvm asking pt to call back. Pls relay Dr Elfredia message and get answer to his question.

## 2024-03-30 NOTE — Telephone Encounter (Signed)
 Lvm asking pt to call back. Pls relay Dr Elfredia message and get answer to his question.

## 2024-03-31 ENCOUNTER — Ambulatory Visit: Admitting: Family Medicine

## 2024-03-31 ENCOUNTER — Encounter: Payer: Self-pay | Admitting: Family Medicine

## 2024-03-31 VITALS — BP 124/82 | HR 90 | Temp 98.5°F | Ht 70.0 in | Wt 277.5 lb

## 2024-03-31 DIAGNOSIS — M549 Dorsalgia, unspecified: Secondary | ICD-10-CM | POA: Diagnosis not present

## 2024-03-31 MED ORDER — CITALOPRAM HYDROBROMIDE 10 MG PO TABS: 10.0000 mg | ORAL_TABLET | Freq: Every day | ORAL | 3 refills | Status: AC

## 2024-03-31 MED ORDER — PREDNISONE 20 MG PO TABS: ORAL_TABLET | ORAL | 0 refills | Status: DC

## 2024-03-31 MED ORDER — CITALOPRAM HYDROBROMIDE 10 MG PO TABS: 10.0000 mg | ORAL_TABLET | Freq: Every day | ORAL | 3 refills | Status: DC

## 2024-03-31 NOTE — Telephone Encounter (Signed)
 Pt has ER f/u OV today at 12:00 with Dr Cleatus.

## 2024-03-31 NOTE — Progress Notes (Signed)
 ER eval, pain after heavy lifting.  He improved with prednisone  with short course but then sx returned like prev.  Other meds didn't help.  Lidocaine  naproxen and flexeril  didn't help.  Sugar had been ~110 at home recently.  B lower but not midline back pain.  No radiation into the legs. No pain sitting at OV.    Meds, vitals, and allergies reviewed.   ROS: Per HPI unless specifically indicated in ROS section   Nad Ncat Neck supple, no LA Rrr Ctab Abd soft, not ttp No pain twisting but pain upon standing.   Has back pain with SLR on either side.

## 2024-03-31 NOTE — Patient Instructions (Signed)
 Restart prednisone  and keep stretching.  Update me if needed, especially if your sugar is dramatically higher.   Take care.  Glad to see you.

## 2024-04-03 NOTE — Assessment & Plan Note (Signed)
 Discussed options. Restart prednisone  and keep stretching.  Update me if needed, especially if sugar is dramatically higher.   He agrees to plan.

## 2024-05-20 ENCOUNTER — Encounter: Payer: Self-pay | Admitting: Family Medicine

## 2024-05-20 ENCOUNTER — Ambulatory Visit: Admitting: Family Medicine

## 2024-05-20 VITALS — BP 124/78 | HR 78 | Temp 98.6°F | Ht 70.0 in | Wt 287.0 lb

## 2024-05-20 DIAGNOSIS — E1129 Type 2 diabetes mellitus with other diabetic kidney complication: Secondary | ICD-10-CM | POA: Diagnosis not present

## 2024-05-20 DIAGNOSIS — R059 Cough, unspecified: Secondary | ICD-10-CM | POA: Diagnosis not present

## 2024-05-20 DIAGNOSIS — Z794 Long term (current) use of insulin: Secondary | ICD-10-CM | POA: Diagnosis not present

## 2024-05-20 DIAGNOSIS — R809 Proteinuria, unspecified: Secondary | ICD-10-CM

## 2024-05-20 LAB — POCT GLYCOSYLATED HEMOGLOBIN (HGB A1C): Hemoglobin A1C: 9.1 % — AB (ref 4.0–5.6)

## 2024-05-20 MED ORDER — BENZONATATE 200 MG PO CAPS: 200.0000 mg | ORAL_CAPSULE | Freq: Three times a day (TID) | ORAL | 1 refills | Status: AC | PRN

## 2024-05-20 MED ORDER — PEN NEEDLES 30G X 5 MM MISC: 3 refills | Status: AC

## 2024-05-20 MED ORDER — LANTUS SOLOSTAR 100 UNIT/ML ~~LOC~~ SOPN: 5.0000 [IU] | PEN_INJECTOR | Freq: Every day | SUBCUTANEOUS | 99 refills | Status: AC

## 2024-05-20 NOTE — Progress Notes (Unsigned)
 Diabetes:  Using medications without difficulties: yes Hypoglycemic episodes:no Hyperglycemic episodes:no Feet problems: no Blood Sugars averaging: 200s.  eye exam within last year: yes  A1c 9.1, d/w pt at OV.   Sx started about 2 weeks ago.  Had diarrhea episodically.  Sick contacts.   Then had a cough.  Last vomited a few days ago.  No diarrhea now.  Still with cough.  No sputum.  No fevers. Cough is some better with tessalon .  Cough is slowly getting better.   Meds, vitals, and allergies reviewed.   ROS: Per HPI unless specifically indicated in ROS section   GEN: nad, alert and oriented HEENT: mucous membranes moist NECK: supple w/o LA CV: rrr. PULM: ctab, no inc wob ABD: soft, +bs EXT: no edema SKIN: no acute rash  Diabetic foot exam: Normal inspection No skin breakdown No calluses  Normal DP pulses Normal sensation to light touch and monofilament Nails normal

## 2024-05-20 NOTE — Patient Instructions (Addendum)
 Start 5 units insulin.  If AM sugar above 150, then add 1 unit.  If below 100, cut back 1 unit.  If 100-150, no change.  Take care.  Glad to see you. Recheck in about 4 months. A1c at the visit.  Update me as needed.  Please let me know how you are doing in about 1 week- let me know about your insulin dose and sugar.

## 2024-05-22 ENCOUNTER — Ambulatory Visit: Payer: Self-pay | Admitting: Family Medicine

## 2024-05-22 NOTE — Assessment & Plan Note (Signed)
 Discussed options.  Start insulin with routine cautions.  Prescription sent.  He is familiar with using an insulin pen for injection at home. Start 5 units insulin.  If AM sugar above 150, then add 1 unit.  If below 100, cut back 1 unit.  If 100-150, no change.  Update me as needed. Recheck in about 4 months. A1c at the visit.  Continue work on diet and exercise.

## 2024-05-22 NOTE — Assessment & Plan Note (Signed)
 Cough is some better with tessalon .  Cough is slowly getting better.  Lungs are clear.  Okay for outpatient follow-up.  Should gradually resolve.  Update me as needed.

## 2024-06-18 ENCOUNTER — Ambulatory Visit
Admission: EM | Admit: 2024-06-18 | Discharge: 2024-06-18 | Disposition: A | Discharge status: Home / Self Care | Attending: Emergency Medicine | Admitting: Emergency Medicine

## 2024-06-18 DIAGNOSIS — Z23 Encounter for immunization: Secondary | ICD-10-CM | POA: Diagnosis not present

## 2024-06-18 DIAGNOSIS — S51811A Laceration without foreign body of right forearm, initial encounter: Secondary | ICD-10-CM

## 2024-06-18 MED ORDER — TETANUS-DIPHTH-ACELL PERTUSSIS 5-2-15.5 LF-MCG/0.5 IM SUSP
0.5000 mL | Freq: Once | INTRAMUSCULAR | Status: AC
  Administered 2024-06-18: 0.5 mL via INTRAMUSCULAR

## 2024-06-18 MED ORDER — CEPHALEXIN 500 MG PO CAPS: 500.0000 mg | ORAL_CAPSULE | Freq: Four times a day (QID) | ORAL | 0 refills | Status: AC

## 2024-06-18 NOTE — ED Provider Notes (Signed)
 " Kyle Murphy    CSN: [id number] Arrival date & time: 06/18/24  1036      History   Chief Complaint Chief Complaint  Patient presents with   Laceration    HPI Kyle Murphy is a 49 y.o. male.  Patient presents with a laceration on his right forearm near his elbow.  The laceration occurred approximately 30 minutes PTA.  He was throwing an old dirty toilet into the dumpster when it broke and the porcelain cut him.  No wound cleaning or treatment at home.  No numbness, weakness, paresthesias.  Last tetanus 2018.  His medical history includes diabetes.  The history is provided by the patient and medical records.    Past Medical History:  Diagnosis Date   Anxiety    Diabetes (HCC)    History of heartburn    HLD (hyperlipidemia)    Leg cramps    history of   Migraines    with photo and phonophobia   Multiple thyroid  nodules    Plantar fasciitis    L foot, [facility] 2014   Salmonella enteritis 2011   stool culture positive    Patient Active Problem List   Diagnosis Date Noted   Hand cramp 11/18/2023   Chronic kidney disease due to type 2 diabetes mellitus (HCC) 11/04/2022   Hyperuricemia 11/04/2022   Severe sleep apnea 12/25/2021   Lower extremity edema 12/25/2021   Proteinuria 11/15/2021   Colon cancer screening    Rectal polyp    Statin intolerance 09/11/2021   Loud snoring 09/11/2021   Metatarsalgia 05/04/2019   Rash 01/30/2019   Sciatica of right side 11/07/2018   Lower urinary tract symptoms (LUTS) 07/30/2018   Allergy 07/30/2018   Lip swelling 04/20/2018   Skin lesion 01/27/2018   Hematuria 07/19/2017   Fatty liver 07/19/2017   Anxiety 07/26/2016   Diabetes mellitus with renal manifestation (HCC) 03/30/2016   Arm paresthesia, left 03/30/2016   Advance care planning 08/09/2014   GERD (gastroesophageal reflux disease) 08/09/2014   Cough 11/28/2013   Plantar fasciitis 09/23/2013   Routine general medical examination at a  health care facility 04/01/2011   Back pain 04/01/2011   Hyperlipidemia 03/28/2010   Multinodular goiter 03/28/2010    Past Surgical History:  Procedure Laterality Date   CHOLECYSTECTOMY     COLONOSCOPY WITH PROPOFOL  N/A 10/11/2021   Procedure: COLONOSCOPY WITH PROPOFOL ;  Surgeon: Unk Corinn Skiff, MD;  Location: [facility] ENDOSCOPY;  Service: Gastroenterology;  Laterality: N/A;   TONSILLECTOMY         Home Medications    Prior to Admission medications  Medication Sig Start Date End Date Taking? Authorizing Provider  cephALEXin  (KEFLEX ) 500 MG capsule Take 1 capsule (500 mg total) by mouth 4 (four) times daily. 06/18/24  Yes Corlis Burnard DEL, NP  allopurinol (ZYLOPRIM) 100 MG tablet Take 100 mg by mouth daily. 10/12/22   [provider]  benzonatate  (TESSALON ) 200 MG capsule Take 1 capsule (200 mg total) by mouth 3 (three) times daily as needed for cough. 05/20/24   Cleatus Arlyss RAMAN, MD  Blood Glucose Calibration (CONTOUR NEXT CONTROL) Normal SOLN USE TO CHECK CONTROLS ON  GLUCOSE METER 11/05/21   Cleatus Arlyss RAMAN, MD  Blood Glucose Monitoring Suppl (CONTOUR NEXT MONITOR) w/Device KIT Use to check blood sugar up to four times daily as directed. (FOR ICD-10 E11.9). Non insulin dependent. 09/15/22   Cleatus Arlyss RAMAN, MD  citalopram  (CELEXA ) 10 MG tablet Take 1 tablet (10 mg  total) by mouth daily. 03/31/24   Cleatus Arlyss RAMAN, MD  FARXIGA 10 MG TABS tablet Take 10 mg by mouth every morning.    [provider]  furosemide  (LASIX ) 40 MG tablet Take 1 tablet (40 mg total) by mouth daily. 11/16/23   Cleatus Arlyss RAMAN, MD  gabapentin  (NEURONTIN ) 300 MG capsule Take 1 capsule (300 mg total) by mouth at bedtime as needed. 11/16/23   Cleatus Arlyss RAMAN, MD  glucose blood (CONTOUR NEXT TEST) test strip USE TO CHECK BLOOD SUGAR UP TO 4 TIMES DAILY AS  DIRECTED 05/15/22   Cleatus Arlyss RAMAN, MD  insulin glargine  (LANTUS  SOLOSTAR) 100 UNIT/ML Solostar Pen Inject 5-20 Units into the skin daily. 05/20/24    Cleatus Arlyss RAMAN, MD  Insulin Pen Needle (PEN NEEDLES) 30G X 5 MM MISC Use daily with insulin injection. 05/20/24   Cleatus Arlyss RAMAN, MD  Lancet Devices (MICROLET NEXT LANCING DEVICE) MISC USE TO CHECK BLOOD SUGAR UP TO 4 TIMES DAILY AS DIRECTED 12/16/21   Cleatus Arlyss RAMAN, MD  metFORMIN  (GLUCOPHAGE ) 500 MG tablet Take 2 tablets (1,000 mg total) by mouth 2 (two) times daily with a meal. 11/16/23   Cleatus Arlyss RAMAN, MD  pantoprazole  (PROTONIX ) 20 MG tablet Take 1 tablet (20 mg total) by mouth daily. 06/18/23   Cleatus Arlyss RAMAN, MD    Family History Family History  Problem Relation Age of Onset   Hypertension Mother    Depression Mother        bipolar   Diabetes Mother        diet controlled   COPD Mother    Thyroid  disease Mother    Diabetes Father    Thyroid  disease Father    Heart disease Father    Thyroid  disease Brother    Hypertension Brother    Diabetes Brother    Colon cancer Neg Hx    Prostate cancer Neg Hx     Social History Social History[1]   Allergies   Ace inhibitors, Angiotensin receptor blockers, Ozempic  (0.25 or 0.5 mg-dose) [semaglutide (0.25 or 0.5mg -dos)], Penicillins, and Pravastatin    Review of Systems Review of Systems  Constitutional:  Negative for chills and fever.  Musculoskeletal:  Negative for arthralgias and joint swelling.  Skin:  Positive for wound. Negative for color change.  Neurological:  Negative for weakness and numbness.     Physical Exam Triage Vital Signs ED Triage Vitals  Encounter Vitals Group     BP 06/18/24 1123 130/81     Girls Systolic BP Percentile --      Girls Diastolic BP Percentile --      Boys Systolic BP Percentile --      Boys Diastolic BP Percentile --      Pulse Rate 06/18/24 1123 91     Resp 06/18/24 1123 18     Temp 06/18/24 1123 99.5 F (37.5 C)     Temp src --      SpO2 06/18/24 1123 96 %     Weight --      Height --      Head Circumference --      Peak Flow --      Pain Score 06/18/24 1122 5     Pain  Loc --      Pain Education --      Exclude from Growth Chart --    No data found.  Updated Vital Signs BP 130/81   Pulse 91   Temp 99.5 F (37.5 C)  Resp 18   SpO2 96%   Visual Acuity Right Eye Distance:   Left Eye Distance:   Bilateral Distance:    Right Eye Near:   Left Eye Near:    Bilateral Near:     Physical Exam Constitutional:      General: He is not in acute distress. HENT:     Mouth/Throat:     Mouth: Mucous membranes are moist.  Cardiovascular:     Rate and Rhythm: Normal rate.  Pulmonary:     Effort: Pulmonary effort is normal. No respiratory distress.  Musculoskeletal:        General: No swelling or deformity. Normal range of motion.  Skin:    General: Skin is warm and dry.     Capillary Refill: Capillary refill takes less than 2 seconds.     Findings: Lesion present. No erythema.     Comments: 2 cm laceration on right forearm.  Bleeding controlled.  Right UE: Strength 5/5, sensation intact.  Neurological:     General: No focal deficit present.     Mental Status: He is alert.     Sensory: No sensory deficit.     Motor: No weakness.      UC Treatments / Results  Labs (all labs ordered are listed, but only abnormal results are displayed) Labs Reviewed - No data to display  EKG   Radiology No results found.  Procedures Laceration Repair  Date/Time: 06/18/2024 12:07 PM  Performed by: Corlis Burnard DEL, NP Authorized by: Corlis Burnard DEL, NP   Consent:    Consent obtained:  Verbal   Consent given by:  Patient   Risks discussed:  Infection, pain, poor cosmetic result and poor wound healing Universal protocol:    Procedure explained and questions answered to patient or proxy's satisfaction: yes   Anesthesia:    Anesthesia method:  Local infiltration   Local anesthetic:  Lidocaine  1% w/o epi Laceration details:    Location:  Shoulder/arm   Shoulder/arm location:  R lower arm   Length (cm):  2   Depth (mm):  3 Pre-procedure details:     Preparation:  Patient was prepped and draped in usual sterile fashion Exploration:    Hemostasis achieved with:  Direct pressure   Imaging outcome: foreign body not noted     Wound exploration: wound explored through full range of motion and entire depth of wound visualized   Treatment:    Area cleansed with:  Povidone-iodine   Amount of cleaning:  Standard   Irrigation solution:  Sterile water   Irrigation method:  Syringe   Visualized foreign bodies/material removed: no   Skin repair:    Repair method:  Sutures   Suture size:  4-0   Suture material:  Nylon   Suture technique:  Simple interrupted   Number of sutures:  4 Approximation:    Approximation:  Close Repair type:    Repair type:  Simple Post-procedure details:    Dressing:  Antibiotic ointment and non-adherent dressing   Procedure completion:  Tolerated well, no immediate complications  (including critical care time)  Medications Ordered in UC Medications  Tdap (ADACEL) injection 0.5 mL (0.5 mLs Intramuscular Given 06/18/24 1206)    Initial Impression / Assessment and Plan / UC Course  I have reviewed the triage vital signs and the nursing notes.  Pertinent labs & imaging results that were available during my care of the patient were reviewed by me and considered in my medical decision making (see chart  for details).   Laceration of right forearm.  4 sutures.  Tetanus updated today.  Prophylactically treating with cephalexin  for infection prevention as the wound was caused by a old dirty toilet and patient is diabetic.  Wound care instructions and signs of infection discussed with patient.  Education provided on laceration care.  Instructed him to return here for suture removal in 7 to 10 days.  Instructed him to return right away if he notes signs of infection.  He agrees to plan of care.   Final Clinical Impressions(s) / UC Diagnoses   Final diagnoses:  Laceration of right forearm, initial encounter      Discharge Instructions      Your tetanus was updated today.    Take the antibiotic cephalexin  as directed for infection prevention.    Keep your wound clean and dry.  Wash it gently twice a day with soap and water.  Apply an antibiotic ointment and nonstick dressing.    Your stitches need to be taken out in 7 to 10 days.    Return right away if you see signs of infection such as redness, pus, fever.     ED Prescriptions     Medication Sig Dispense Auth. Provider   cephALEXin  (KEFLEX ) 500 MG capsule Take 1 capsule (500 mg total) by mouth 4 (four) times daily. 28 capsule Corlis Burnard DEL, NP      PDMP not reviewed this encounter.     [1]  Social History Tobacco Use   Smoking status: Never   Smokeless tobacco: Never  Vaping Use   Vaping status: Never Used  Substance Use Topics   Alcohol use: No    Alcohol/week: 0.0 standard drinks of alcohol    Comment: occassionally   Drug use: No     Corlis Burnard DEL, NP 06/18/24 1211  "

## 2024-06-18 NOTE — Discharge Instructions (Addendum)
 Your tetanus was updated today.    Take the antibiotic cephalexin  as directed for infection prevention.    Keep your wound clean and dry.  Wash it gently twice a day with soap and water.  Apply an antibiotic ointment and nonstick dressing.    Your stitches need to be taken out in 7 to 10 days.    Return right away if you see signs of infection such as redness, pus, fever.

## 2024-06-18 NOTE — ED Triage Notes (Addendum)
 Patient to Urgent Care with complaints of a laceration to his R elbow. Reports he was lifting something that broke as he threw it into a dumpster and it cut him.  Approx one inch, non bleeding lac to R elbow.   Tdap 08/15/2016

## 2024-06-25 ENCOUNTER — Ambulatory Visit
Admission: EM | Admit: 2024-06-25 | Discharge: 2024-06-25 | Disposition: A | Discharge status: Home / Self Care | Attending: Family Medicine | Admitting: Family Medicine

## 2024-06-25 DIAGNOSIS — Z4802 Encounter for removal of sutures: Secondary | ICD-10-CM | POA: Diagnosis not present

## 2024-06-25 NOTE — ED Triage Notes (Signed)
 Patient here to have sutures removed from right elbow. Patient voices no other complaints.

## 2024-07-06 ENCOUNTER — Telehealth: Payer: Self-pay | Admitting: Family Medicine

## 2024-07-06 DIAGNOSIS — E119 Type 2 diabetes mellitus without complications: Secondary | ICD-10-CM

## 2024-07-06 MED ORDER — CONTOUR NEXT TEST VI STRP: ORAL_STRIP | 3 refills | Status: AC

## 2024-07-06 NOTE — Telephone Encounter (Signed)
 Copied from CRM #[id number]. Topic: Clinical - Medication Refill >> Jul 06, 2024 11:16 AM Rea C wrote: Medication: glucose blood (CONTOUR NEXT TEST) test strip  Has the patient contacted their pharmacy? Pt is on the app trying to put in refill for test strips but experiencing tech difficulty   (Agent: If no, request that the patient contact the pharmacy for the refill. If patient does not wish to contact the pharmacy document the reason why and proceed with request.) (Agent: If yes, when and what did the pharmacy advise?)  This is the patient's preferred pharmacy:  [facility] - [place], [place] - [address] [address] [place] [address] [place] [address] Phone: [phone] Fax: [phone]  Is this the correct pharmacy for this prescription? Yes If no, delete pharmacy and type the correct one.   Has the prescription been filled recently? Yes  Is the patient out of the medication? Yes  Has the patient been seen for an appointment in the last year OR does the patient have an upcoming appointment? Yes  Can we respond through MyChart? Yes  Agent: Please be advised that Rx refills may take up to 3 business days. We ask that you follow-up with your pharmacy.

## 2024-09-19 ENCOUNTER — Ambulatory Visit: Admitting: Family Medicine
